# Patient Record
Sex: Female | Born: 1960 | Race: Black or African American | Hispanic: No | Smoking: Current every day smoker
Health system: Southern US, Community
[De-identification: ages and names within clinical notes are randomized; demographics above are authoritative.]

## PROBLEM LIST (undated history)

## (undated) DIAGNOSIS — I10 Essential (primary) hypertension: Secondary | ICD-10-CM

## (undated) DIAGNOSIS — Z8042 Family history of malignant neoplasm of prostate: Secondary | ICD-10-CM

## (undated) DIAGNOSIS — E119 Type 2 diabetes mellitus without complications: Secondary | ICD-10-CM

## (undated) DIAGNOSIS — Z95828 Presence of other vascular implants and grafts: Secondary | ICD-10-CM

## (undated) DIAGNOSIS — C259 Malignant neoplasm of pancreas, unspecified: Secondary | ICD-10-CM

## (undated) DIAGNOSIS — Z800 Family history of malignant neoplasm of digestive organs: Secondary | ICD-10-CM

## (undated) HISTORY — PX: OTHER SURGICAL HISTORY: SHX169

## (undated) HISTORY — PX: KIDNEY SURGERY: SHX687

## (undated) HISTORY — PX: BACK SURGERY: SHX140

## (undated) HISTORY — DX: Family history of malignant neoplasm of prostate: Z80.42

## (undated) HISTORY — PX: ABDOMINAL HYSTERECTOMY: SHX81

## (undated) HISTORY — DX: Malignant neoplasm of pancreas, unspecified: C25.9

## (undated) HISTORY — DX: Essential (primary) hypertension: I10

## (undated) HISTORY — DX: Type 2 diabetes mellitus without complications: E11.9

## (undated) HISTORY — DX: Family history of malignant neoplasm of digestive organs: Z80.0

---
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
## 2019-01-05 ENCOUNTER — Encounter: Payer: Self-pay | Admitting: Hematology

## 2019-05-06 ENCOUNTER — Encounter (HOSPITAL_COMMUNITY): Payer: Self-pay | Admitting: *Deleted

## 2019-05-09 ENCOUNTER — Inpatient Hospital Stay (HOSPITAL_COMMUNITY): Payer: Medicare HMO | Attending: Hematology | Admitting: Hematology

## 2019-05-09 ENCOUNTER — Encounter (HOSPITAL_COMMUNITY): Payer: Self-pay | Admitting: Hematology

## 2019-05-09 ENCOUNTER — Encounter (HOSPITAL_COMMUNITY): Payer: Self-pay | Admitting: Surgery

## 2019-05-09 ENCOUNTER — Other Ambulatory Visit (HOSPITAL_COMMUNITY): Payer: Medicare HMO

## 2019-05-09 ENCOUNTER — Other Ambulatory Visit: Payer: Self-pay

## 2019-05-09 VITALS — BP 114/94 | HR 108 | Temp 97.5°F | Resp 18 | Wt 221.1 lb

## 2019-05-09 DIAGNOSIS — Z79899 Other long term (current) drug therapy: Secondary | ICD-10-CM | POA: Insufficient documentation

## 2019-05-09 DIAGNOSIS — Z5111 Encounter for antineoplastic chemotherapy: Secondary | ICD-10-CM | POA: Insufficient documentation

## 2019-05-09 DIAGNOSIS — Z794 Long term (current) use of insulin: Secondary | ICD-10-CM | POA: Insufficient documentation

## 2019-05-09 DIAGNOSIS — K5900 Constipation, unspecified: Secondary | ICD-10-CM | POA: Diagnosis not present

## 2019-05-09 DIAGNOSIS — I10 Essential (primary) hypertension: Secondary | ICD-10-CM | POA: Insufficient documentation

## 2019-05-09 DIAGNOSIS — C259 Malignant neoplasm of pancreas, unspecified: Secondary | ICD-10-CM

## 2019-05-09 DIAGNOSIS — C787 Secondary malignant neoplasm of liver and intrahepatic bile duct: Secondary | ICD-10-CM

## 2019-05-09 DIAGNOSIS — R5383 Other fatigue: Secondary | ICD-10-CM | POA: Diagnosis not present

## 2019-05-09 DIAGNOSIS — E119 Type 2 diabetes mellitus without complications: Secondary | ICD-10-CM | POA: Insufficient documentation

## 2019-05-09 DIAGNOSIS — Z7189 Other specified counseling: Secondary | ICD-10-CM

## 2019-05-09 DIAGNOSIS — Z90710 Acquired absence of both cervix and uterus: Secondary | ICD-10-CM | POA: Diagnosis not present

## 2019-05-09 DIAGNOSIS — C252 Malignant neoplasm of tail of pancreas: Secondary | ICD-10-CM | POA: Insufficient documentation

## 2019-05-09 DIAGNOSIS — F17210 Nicotine dependence, cigarettes, uncomplicated: Secondary | ICD-10-CM | POA: Diagnosis not present

## 2019-05-09 DIAGNOSIS — R109 Unspecified abdominal pain: Secondary | ICD-10-CM | POA: Diagnosis not present

## 2019-05-09 NOTE — Progress Notes (Signed)
START ON PATHWAY REGIMEN - Pancreatic Adenocarcinoma     A cycle is every 28 days:     Nab-paclitaxel (protein bound)      Gemcitabine   **Always confirm dose/schedule in your pharmacy ordering system**  Patient Characteristics: Metastatic Disease, First Line, PS = 0,1, BRCA1/2 and PALB2  Mutation Absent/Unknown Current evidence of distant metastases<= Yes AJCC T Category: TX AJCC N Category: NX AJCC M Category: M1 AJCC 8 Stage Grouping: IV Line of Therapy: First Line ECOG Performance Status: 1 BRCA1/2 Mutation Status: Awaiting Test Results PALB2 Mutation Status: Awaiting Test Results Intent of Therapy: Non-Curative / Palliative Intent, Discussed with Patient

## 2019-05-09 NOTE — Assessment & Plan Note (Addendum)
1.  Metastatic pancreatic cancer to the liver: - Biopsy of the pancreatic tail mass on 01/05/2019-moderately to poorly differentiated adenocarcinoma - 2 cycles of FOLFIRINOX from 02/25/2019 and 03/28/2019. - CT AP on 04/04/2019 pancreatic mass showing 3.3 x 4.7 x 2.9 cm.  Hypodense lesions in the liver, largest 3.2 cm in the right hepatic lobe and 1.4 cm lesion adjacent to the gallbladder fossa.  There is a 3.3 cm hypodense lesion adjacent to the falciform ligament.  This scan was compared to CT of the abdomen and pelvis without contrast on 10/21/2018. - She was thought to have progression and chemotherapy was changed to gemcitabine and Abraxane.  She received 2 doses on 03/14/2019 and 03/28/2019. - She moved to [place], closer to family. - I do not believe she has a true progression to FOLFIRINOX as she only received 2 cycles and her CT scan was compared to scan from January 2020.  We will request discs of the CT scan. - We will continue gemcitabine and Abraxane regimen at this time.  If she progresses on the current regimen, will consider 5-FU and Onivyde followed by FOLFOX. - I have also talked to her about the importance of checking germline mutation testing.  She is agreeable.  We will make a referral to [facility]. - We will also send foundation 1 testing for somatic mutations. -We will obtain a baseline CA 19-9 level.  We will plan to start her chemotherapy next week.  2.  Abdominal pain: - She is currently taking oxycodone 10 mg every 6 hours as needed which is controlling her pain. - We will give her a refill.  3.  Constipation: -She apparently takes Linzess which controls it well.

## 2019-05-09 NOTE — Progress Notes (Signed)
Oncology Navigator note:  I met with patient during the visit with Dr. Katragadda today.  She was here alone.  Patient just moved here from [place] and now lives with her brother.  She states that she has trouble with transportation.  I have provided her with information on the [facility].  I have also told her that our social worker will be in touch with her about any needs.  I provided patient with my contact information.    I contacted her referring office and got her notes, treatment plan flowsheet of all treatments, pathology report, and labs.  I have ordered foundation one on specimen that is located at [facility] specimen ID # [id number].     

## 2019-05-09 NOTE — Progress Notes (Signed)
[facility] CONSULT NOTE  No care team member to display  CHIEF COMPLAINTS/PURPOSE OF CONSULTATION:  Metastatic pancreatic cancer to the liver.  HISTORY OF PRESENTING ILLNESS:  Doris [name] 58 y.o. female is seen in consultation today at the request of Dr. Mervin Hack for further management of metastatic pancreatic cancer to the liver.  She used to live in [facility] and recently moved to [place].  She was hospitalized for COPD exacerbation in October 2020 and was found to have incidental 28 mm pancreatic tail lesion.  CT of the abdomen on 10/21/2018 showed 2.3 cm mass within the pancreatic tail.  On 01/05/2019 she underwent endoscopic ultrasound which showed 42 x 27 mm mass, biopsy demonstrating poorly differentiated adenocarcinoma.  She was evaluated by pancreatic surgeon on 01/19/2019.  On Jan 28, 2019, on explorative laparotomy she was found to have poorly differentiated adenocarcinoma consistent with pancreatic primary in segment 3 and 5 of the liver.  She was then started on FOLFIRINOX on 02/25/2019.  She reportedly had abdominal pain, likely from Irinotecan with first cycle which improved with atropine.  Other than that she did not have any major side effects.  She did have some fatigue.  She received cycle 2 on March 28, 2019.  She reportedly had a CT scan on 04/04/2019 which showed hypodense pancreatic body mass measuring 3.3 x 4.7 x 2.9 cm.  Hypodense lesions in the liver were seen, largest measuring 3.3 x 2.0 cm, 3.2 cm and 1.4 cm.  Chemotherapy was changed to gemcitabine and Abraxane, she received 2 doses on 03/14/2019 and 03/28/2019.  She did not experience any major side effects from it.  She has moved to [place] and currently lives with her brother.  Her father also lives in [place].  She reported about 40 pound weight loss since May of this year.  She used to work as a Quarry manager at [employer].  She is a current active smoker, smokes 10 cigarettes/day for 35  years.  She has 1 daughter who lives in [place].  Family history significant for mother who died of lung cancer.  No family history of pancreatic cancer.  She reportedly had constipation and was on Linzess.  She reports some tingling in the feet in the mornings.  She also has diabetes.  Appetite and energy levels are 75%.  MEDICAL HISTORY:  Past Medical History:  Diagnosis Date  . Diabetes mellitus without complication ([place])   . Hypertension   . Pancreatic cancer [facility])     SURGICAL HISTORY: Past Surgical History:  Procedure Laterality Date  . ABDOMINAL HYSTERECTOMY    . BACK SURGERY    . KIDNEY SURGERY     per pt, had leakage  . tubal ligation Left     SOCIAL HISTORY: Social History   Socioeconomic History  . Marital status: Divorced    Spouse name: Not on file  . Number of children: 1  . Years of education: Not on file  . Highest education level: Not on file  Occupational History  . Occupation: disabled  Social Needs  . Financial resource strain: Not hard at all  . Food insecurity    Worry: Never true    Inability: Never true  . Transportation needs    Medical: Yes    Non-medical: Yes  Tobacco Use  . Smoking status: Current Every Day Smoker    Packs/day: 1.00    Years: 45.00    Pack years: 45.00    Types: Cigarettes  . Smokeless  tobacco: Never Used  Substance and Sexual Activity  . Alcohol use: Not Currently  . Drug use: Never  . Sexual activity: Not on file  Lifestyle  . Physical activity    Days per week: 0 days    Minutes per session: 0 min  . Stress: Not at all  Relationships  . Social connections    Talks on phone: More than three times a week    Gets together: More than three times a week    Attends religious service: 1 to 4 times per year    Active member of club or organization: No    Attends meetings of clubs or organizations: Never    Relationship status: Divorced  . Intimate partner violence    Fear of current or ex partner: No     Emotionally abused: No    Physically abused: No    Forced sexual activity: No  Other Topics Concern  . Not on file  Social History Narrative  . Not on file    FAMILY HISTORY: Family History  Problem Relation Age of Onset  . Diabetes Mother   . Hypertension Mother   . Diabetes Father   . Hypertension Father   . Heart disease Brother     ALLERGIES:  is allergic to hydrocodone and tramadol.  MEDICATIONS:  Current Outpatient Medications  Medication Sig Dispense Refill  . amLODipine (NORVASC) 5 MG tablet Take 5 mg by mouth 2 (two) times daily.    . insulin aspart (NOVOLOG) 100 UNIT/ML injection Inject 20 Units into the skin 3 (three) times daily before meals.    Marland Kitchen lisinopril (ZESTRIL) 40 MG tablet Take 40 mg by mouth daily.    . ondansetron (ZOFRAN) 4 MG tablet Take 4 mg by mouth 3 (three) times daily.    . Oxycodone HCl 10 MG TABS Take 10 mg by mouth every 6 (six) hours.     No current facility-administered medications for this visit.     REVIEW OF SYSTEMS:   Constitutional: Denies fevers, chills or abnormal night sweats Eyes: Denies blurriness of vision, double vision or watery eyes Ears, nose, mouth, throat, and face: Denies mucositis or sore throat Respiratory: Denies cough, dyspnea or wheezes Cardiovascular: Denies palpitation, chest discomfort or lower extremity swelling Gastrointestinal:  Denies nausea, heartburn or change in bowel habits positive for constipation. Skin: Denies abnormal skin rashes Lymphatics: Denies new lymphadenopathy or easy bruising Neurological: Numbness in the fingertips and feet in the mornings present. Behavioral/Psych: Mood is stable, no new changes  All other systems were reviewed with the patient and are negative.  PHYSICAL EXAMINATION: ECOG PERFORMANCE STATUS: 1 - Symptomatic but completely ambulatory  Vitals:   05/09/19 1257  BP: (!) 114/94  Pulse: (!) 108  Resp: 18  Temp: (!) 97.5 F (36.4 C)  SpO2: 97%   Filed Weights    05/09/19 1257  Weight: 221 lb 1.6 oz (100.3 kg)    GENERAL:alert, no distress and comfortable SKIN: skin color, texture, turgor are normal, no rashes or significant lesions EYES: normal, conjunctiva are pink and non-injected, sclera clear OROPHARYNX:no exudate, no erythema and lips, buccal mucosa, and tongue normal  NECK: supple, thyroid normal size, non-tender, without nodularity LYMPH:  no palpable lymphadenopathy in the cervical, axillary or inguinal LUNGS: clear to auscultation and percussion with normal breathing effort HEART: regular rate & rhythm and no murmurs and no lower extremity edema ABDOMEN:abdomen soft, non-tender and normal bowel sounds Musculoskeletal:no cyanosis of digits and no clubbing  PSYCH: alert &  oriented x 3 with fluent speech NEURO: no focal motor/sensory deficits  LABORATORY DATA:  I have reviewed the data as listed No results found for: WBC, HGB, HCT, MCV, PLT   Chemistry   No results found for: NA, K, CL, CO2, BUN, CREATININE, GLU No results found for: CALCIUM, ALKPHOS, AST, ALT, BILITOT     RADIOGRAPHIC STUDIES: I have personally reviewed the radiological images as listed and agreed with the findings in the report.  ASSESSMENT & PLAN:  Pancreatic cancer metastasized to liver ([place]) 1.  Metastatic pancreatic cancer to the liver: - Biopsy of the pancreatic tail mass on 01/05/2019-moderately to poorly differentiated adenocarcinoma - 2 cycles of FOLFIRINOX from 02/25/2019 and 03/28/2019. - CT AP on 04/04/2019 pancreatic mass showing 3.3 x 4.7 x 2.9 cm.  Hypodense lesions in the liver, largest 3.2 cm in the right hepatic lobe and 1.4 cm lesion adjacent to the gallbladder fossa.  There is a 3.3 cm hypodense lesion adjacent to the falciform ligament.  This scan was compared to CT of the abdomen and pelvis without contrast on 10/21/2018. - She was thought to have progression and chemotherapy was changed to gemcitabine and Abraxane.  She received 2 doses on 03/14/2019  and 03/28/2019. - She moved to [place], closer to family. - I do not believe she has a true progression to FOLFIRINOX as she only received 2 cycles and her CT scan was compared to scan from January 2020.  We will request discs of the CT scan. - We will continue gemcitabine and Abraxane regimen at this time.  If she progresses on the current regimen, will consider 5-FU and Onivyde followed by FOLFOX. - I have also talked to her about the importance of checking germline mutation testing.  She is agreeable.  We will make a referral to [facility]. - We will also send foundation 1 testing for somatic mutations. -We will obtain a baseline CA 19-9 level.  We will plan to start her chemotherapy next week.  2.  Abdominal pain: - She is currently taking oxycodone 10 mg every 6 hours as needed which is controlling her pain. - We will give her a refill.  3.  Constipation: -She apparently takes Linzess which controls it well.  Orders Placed This Encounter  Procedures  . CBC with Differential/Platelet    Standing Status:   Future    Standing Expiration Date:   05/08/2020  . Comprehensive metabolic panel    Standing Status:   Future    Standing Expiration Date:   05/08/2020  . Cancer antigen 19-9    Standing Status:   Future    Standing Expiration Date:   05/08/2020    All questions were answered. The patient knows to call the clinic with any problems, questions or concerns.     Derek Jack, MD 05/09/2019 5:42 PM

## 2019-05-09 NOTE — Patient Instructions (Addendum)
[place] at [facility] Discharge Instructions  You were seen today by Dr. Delton Coombes. He went over your history, family history and how you've been feeling lately. He will schedule you for a genetic consult.  He will see you back in 1 week for labs and follow up.   Thank you for choosing [place] at [facility] to provide your oncology and hematology care.  To afford each patient quality time with our provider, please arrive at least 15 minutes before your scheduled appointment time.   If you have a lab appointment with the [facility] please come in thru the  Main Entrance and check in at the main information desk  You need to re-schedule your appointment should you arrive 10 or more minutes late.  We strive to give you quality time with our providers, and arriving late affects you and other patients whose appointments are after yours.  Also, if you no show three or more times for appointments you may be dismissed from the clinic at the providers discretion.     Again, thank you for choosing [facility].  Our hope is that these requests will decrease the amount of time that you wait before being seen by our physicians.       _____________________________________________________________  Should you have questions after your visit to [facility], please contact our office at [phone] between the hours of 8:00 a.m. and 4:30 p.m.  Voicemails left after 4:00 p.m. will not be returned until the following business day.  For prescription refill requests, have your pharmacy contact our office and allow 72 hours.    Cancer Center Support Programs:   > Cancer Support Group  2nd Tuesday of the month 1pm-2pm, Journey Room

## 2019-05-10 ENCOUNTER — Encounter: Payer: Self-pay | Admitting: General Practice

## 2019-05-10 ENCOUNTER — Telehealth (HOSPITAL_COMMUNITY): Payer: Self-pay | Admitting: Hematology

## 2019-05-10 NOTE — Progress Notes (Signed)
[facility] CSW Progress Notes  Message received from [facility], patient struggles w transportation between home and [facility].  Spoke w patient, gave information on possibility of [employer] (transportation benefit associated w her Medicare), Medicaid transport (alerted her that she may need to transfer her Medicaid to [place] as she is now a resident of [place]) and [employer] (for gas card program).  Patient given number for [facility] and encouraged to call her insurance carriers.  Edwyna Shell, LCSW Clinical Social Worker Phone:  [phone] Cell:  [phone]

## 2019-05-10 NOTE — Telephone Encounter (Signed)
Pts height was not documented on visit. Called pt to get height information for chemo auth purposes. Pt stated she is 5'9.

## 2019-05-11 ENCOUNTER — Telehealth (HOSPITAL_COMMUNITY): Payer: Self-pay | Admitting: Hematology

## 2019-05-11 NOTE — Telephone Encounter (Signed)
pc made to [facility] spk to [place]. Requested she release/withdraw their chemo auth from [facility]. Per  Judeen Hammans she would handle it right away.  Judeen Hammans [phone] _P

## 2019-05-16 ENCOUNTER — Encounter (HOSPITAL_COMMUNITY): Payer: Self-pay

## 2019-05-16 DIAGNOSIS — Z95828 Presence of other vascular implants and grafts: Secondary | ICD-10-CM | POA: Insufficient documentation

## 2019-05-16 HISTORY — DX: Presence of other vascular implants and grafts: Z95.828

## 2019-05-16 MED ORDER — PROCHLORPERAZINE MALEATE 10 MG PO TABS: 10.0000 mg | ORAL_TABLET | Freq: Four times a day (QID) | ORAL | 1 refills | Status: DC | PRN

## 2019-05-16 MED ORDER — LIDOCAINE-PRILOCAINE 2.5-2.5 % EX CREA: TOPICAL_CREAM | CUTANEOUS | 3 refills | Status: DC

## 2019-05-16 NOTE — Patient Instructions (Addendum)
Nab-paclitaxel (protein-bound) (Abraxane)  About This Drug  Nab-paclitaxel is used to treat cancer. It is given in the vein (IV).  Possible Side Effects . Bone marrow suppression. This is a decrease in the number of white blood cells, red blood cells, and platelets. This may raise your risk of infection, make you tired and weak (fatigue), and raise your risk of bleeding. . Abnormal heart beat, blood pressure, and/or abnormal EKG (electrocardiogram) . Nausea and vomiting (throwing up) . Diarrhea (loose bowel movements) . Tiredness, weakness . Swelling of your legs, ankles and/or feet . Fever . Changes in your liver function . Infection . Dehydration (lack of water in the body from losing too much fluid) . Decreased appetite (decreased hunger) . Joint, bone and muscle pain . Rash . Effects on the nerves are called peripheral neuropathy. You may feel numbness, tingling, or pain in your hands and feet. It may be hard for you to button your clothes, open jars, or walk as usual. The effect on the nerves may get worse with more doses of the drug. These effects get better in some people after the drug is stopped but it does not get better in all people. . Hair loss. Hair loss is often temporary, although with certain medicine, hair loss can sometimes be permanent. Hair loss may happen suddenly or gradually. If you lose hair, you may lose it from your head, face, armpits, pubic area, chest, and/or legs. You may also notice your hair getting thin.  Note: Each of the side effects above was reported in 20% or greater of patients treated with nabpaclitaxel. Not all possible side effects are included above.  Warnings and Precautions  . Severe bone marrow suppression . Inflammation (swelling) of the lungs. You may have a dry cough or trouble breathing. . Severe infections which can be life-threatening . Severe peripheral neuropathy . Allergic reactions, including anaphylaxis are rare but may  happen in some patients, which can be life-threatening. Signs of allergic reaction to this drug may be swelling of the face, feeling like your tongue or throat are swelling, trouble breathing, rash, itching, fever, chills, feeling dizzy, and/or feeling that your heart is beating in a fast or not normal way. If this happens, do not take another dose of this drug. You should get urgent medical treatment. . This drug contains albumin, which is a protein from donated human blood. There is a rare risk of transmission of viral diseases.  Note: Some of the side effects above are very rare. If you have concerns and/or questions, please discuss them with your medical team.  Important Information . This drug may be present in the saliva, tears, sweat, urine, stool, vomit, semen, and vaginal secretions. Talk to your doctor and/or your nurse about the necessary precautions to take during this time.  Treating Side Effects  . To decrease the risk of infection, wash your hands regularly. . Avoid close contact with people who have a cold, the flu, or other infections. . Take your temperature as your doctor or nurse tells you, and whenever you feel like you may have a fever. . To help decrease the risk of bleeding, use a soft toothbrush. Check with your nurse before using dental floss. . Be very careful when using knives or tools. . Use an electric shaver instead of a razor. . Manage tiredness by pacing your activities for the day. Be sure to include periods of rest between energy-draining activities. . Drink plenty of fluids (a minimum of eight glasses  per day is recommended). . To help with decreased appetite, eat small, frequent meals. Eat foods high in calories and protein, such as meat, poultry, fish, dry beans, tofu, eggs, nuts, milk, yogurt, cheese, ice cream, pudding, and nutritional supplements. . Consider using sauces and spices to increase taste. Daily exercise, with your doctor's  approval, may increase your appetite. . If you throw up or have loose bowel movements, you should drink more fluids so that you do not become dehydrated (lack of water in the body from losing too much fluid). . To help with nausea and vomiting, eat small, frequent meals instead of three large meals a day. Choose foods and drinks that are at room temperature. Ask your nurse or doctor about other helpful tips and medicine that is available to help stop or lessen these symptoms. . If you have diarrhea, eat low-fiber foods that are high in protein and calories and avoid foods that can irritate your digestive tracts or lead to cramping. . Ask your nurse or doctor about medicine that can lessen or stop your diarrhea. . To help with hair loss, wash hair with a mild shampoo and avoid washing your hair every day. . Avoid rubbing your scalp, pat your hair or scalp dry. . Avoid coloring your hair. . Limit your use of hair spray, electric curlers, blow dryers, and curling irons. . If you are interested in getting a wig, talk to your nurse. You can also call the [facility] at 800-ACS-2345 to find out information about the "Look Good, Feel Better" program close to where you live. It is a free program where women getting chemotherapy can learn about wigs, turbans and scarves as well as makeup techniques and skin and nail care. Marland Kitchen Keeping your pain under control is important to your well-being. Please tell your doctor or nurse if you are experiencing pain. . If you get a rash do not put anything on it unless your doctor or nurse says you may. Keep the area around the rash clean and dry. Ask your doctor for medicine if your rash bothers you. . If you have numbness and tingling in your hands and feet, be careful when cooking, walking, and handling sharp objects and hot liquids.  Food and Drug Interactions  . There are no known interactions of nab-paclitaxel of with food. . This drug may  interact with other medicines. Tell your doctor and pharmacist about all the prescription and over-the-counter medicines and dietary supplements (vitamins, minerals, herbs and others) that you are taking at this time. Also, check with your doctor or pharmacist before starting any new prescription or over-the-counter medicines, or dietary supplements to make sure that there are no interactions.  When to Call the Doctor  Call your doctor or nurse if you have any of these symptoms and/or any new or unusual symptoms: . Fever of 100.4 F (38 C) or higher . Chills . Signs of a local infection such as pain, redness, tenderness, warmth and/or swelling . Easy bleeding or bruising . Wheezing or trouble breathing . Tiredness that interferes with your daily activities . Pain in your chest . Dry cough . Feeling dizzy or lightheaded . Feeling that your heart is beating in a fast or not normal way (palpitations) . Diarrhea, 4 times in one day or diarrhea with weakness or lightheadedness . Nausea that stops you from eating or drinking and/or that isn't relieved by prescribed medicines . Throwing up more than 3 times a day . Lasting loss of  appetite or rapid weight loss of five pounds in a week . Pain that does not go away or is not relieved by prescribed medicine . Numbness, tingling, or pain in your hands and feet . Weight gain of 5 pounds in one week (fluid retention) . Swelling of your legs, ankles and/or feet . Signs of liver problems: dark urine, pale bowel movements, bad stomach pain, feeling very tired and weak, unusual itching, or yellowing of the eyes or skin . Signs of allergic reaction: swelling of the face, feeling like your tongue or throat are swelling, trouble breathing, rash, itching, fever, chills, feeling dizzy, and/or feeling that your heart is beating in a fast or not normal way. If this happens, call 911 for emergency care. . New rash and/or itching . Rash that is not relieved  by prescribed medicines . If you think you are pregnant or have impregnated your partner  Reproduction Warnings  . Pregnancy warning: This drug can have harmful effects on the unborn baby. Women of childbearing potential should use effective methods of birth control during your cancer treatment and for at least 6 months after treatment. Men with female partners of childbearing potential should use effective methods of birth control during your cancer treatment and for at least 3 months after your cancer treatment. Let your doctor know right away if you think you may be pregnant or may have impregnated your partner. . Breastfeeding warning: It is not known if this drug passes into breast milk. For this reason, women should not breastfeed during treatment and for 2 weeks after treatment because this drug could enter the breast milk and cause harm to a breastfeeding baby. . Fertility Warning: In men and women both, this drug may affect your ability to have children in the future. Talk with your doctor or nurse if you plan to have children. Ask for information on sperm or egg banking.  Gemcitabine (Gemzar)  About This Drug  Gemcitabine is used to treat cancer. It is given in the vein (IV).  Possible Side Effects . Bone marrow suppression. This is a decrease in the number of white blood cells, red blood cells, and platelets. This may raise your risk of infection, make you tired and weak (fatigue), and raise your risk of bleeding . Fever . Trouble breathing . Nausea and throwing up (vomiting) . Changes in your liver function . Increased protein in your urine, which can affect how your kidneys work . Blood in your urine . Rash . Swelling of your legs, ankles and/or feet  Note: Each of the side effects above was reported in 20% or greater of patients treated with Gemcitabine. Not all possible side effects are included above.  Warnings and Precautions  . Severe bone marrow  suppression . Inflammation (swelling) of the lungs and/or thickening of the lung tissues, which may be lifethreatening. You may have a dry cough or trouble breathing. . Changes in your kidney function, which can cause kidney failure . Changes in your liver function, which can cause liver failure and may be life-threatening . If you have received radiation treatments, your skin may become red and/or you may develop soreness of the mouth and throat after gemcitabine. This reaction is called "recall." Your body is recalling, or remembering, that it had radiation therapy. . A syndrome where fluid from your veins can leak into your tissues and cause a decrease in your blood pressure and fluid to accumulate in your tissues and/or lungs. . A syndrome can occur that  causes changes to kidney and liver function in combination with a decrease in red blood cells. Kidney failure may result which may be life-threatening. . Changes in your central nervous system can happen. The central nervous system is made up of your brain and spinal cord. You could feel extreme tiredness, agitation, confusion, hallucinations (see or hear things that are not there), have trouble understanding or speaking, loss of control of your bowels or bladder, eyesight changes, numbness or lack of strength to your arms, legs, face, or body, seizures or coma. If you start to have any of these symptoms let your doctor know right away.  Note: Some of the side effects above are very rare. If you have concerns and/or questions, please discuss them with your medical team.  Important Information  . This drug may be present in the saliva, tears, sweat, urine, stool, vomit, semen, and vaginal secretions. Talk to your doctor and/or your nurse about the necessary precautions to take during this time.  Treating Side Effects  . Manage tiredness by pacing your activities for the day. . Be sure to include periods of rest between  energy-draining activities. . To decrease the risk of infection, wash your hands regularly. . Avoid close contact with people who have a cold, the flu, or other infections. . Take your temperature as your doctor or nurse tells you, and whenever you feel like you may have a fever. . To help decrease bleeding, use a soft toothbrush. Check with your nurse before using dental floss. . Be very careful when using knives or tools. . Use an electric shaver instead of a razor. . Drink plenty of fluids (a minimum of eight glasses per day is recommended). . If you throw up or have loose bowel movements, you should drink more fluids so that you do not become dehydrated (lack of water in the body from losing too much fluid). . To help with nausea and vomiting, eat small, frequent meals instead of three large meals a day. Choose foods and drinks that are at room temperature. Ask your nurse or doctor about other helpful tips and medicine that is available to help stop or lessen these symptoms. . If you get a rash do not put anything on it unless your doctor or nurse says you may. Keep the area around the rash clean and dry. Ask your doctor for medicine if your rash bothers you. . If you received radiation, and your skin becomes red or irritated again, or you develop soreness of the mouth and throat, follow the same care instructions you did during radiation treatment. Be sure to tell the nurse or doctor administering your chemotherapy about your skin changes.  Food and Drug Interactions  . There are no known interactions of gemcitabine with food. . This drug may interact with other medicines. Tell your doctor and pharmacist about all the prescription and over-the-counter medicines and dietary supplements (vitamins, minerals, herbs and others) that you are taking at this time. Also, check with your doctor or pharmacist before starting any new prescription or over-the-counter medicines, or dietary  supplements to make sure that there are no interactions.  When to Call the Doctor Call your doctor or nurse if you have any of these symptoms and/or any new or unusual symptoms: . Fever of 100.4 F (38 C) or higher . Chills . Tiredness that interferes with your daily activities . Feeling dizzy or lightheaded . Pain in your chest . Dry cough . Wheezing and/or trouble breathing . Confusion  and/or agitation . Symptoms of a seizure such as confusion, blacking out, passing out, loss of hearing or vision, blurred vision, unusual smells or tastes (such as burning rubber), trouble talking, tremors or shaking in parts or all of the body, repeated body movements, tense muscles that do not relax, and loss of control of urine and bowels. If you or your family member suspects you are having a seizure, call 911 right away. . Hallucinations . Trouble understanding or speaking . Blurry vision or changes in your eyesight . Numbness or lack of strength to your arms, legs, face, or body . Easy bleeding or bruising . Nausea that stops you from eating or drinking and/or is not relieved by prescribed medicines . Throwing up more than 3 times a day . Swelling of legs, ankles, or feet . Weight gain of 5 pounds in one week (fluid retention) . Blood in urine . Decreased urine or very dark urine . Foamy or bubbly-looking urine . A new rash/itching or a rash that is not relieved by prescribed medicines . Signs of possible liver problems: dark urine, pale bowel movements, bad stomach pain, feeling very tired and weak, unusual itching, or yellowing of the eyes or skin . If you think you may be pregnant or may have impregnated your partner  Reproduction Warnings  . Pregnancy warning: This drug can have harmful effects on the unborn baby. Women of childbearing potential should use effective methods of birth control during your cancer treatment and for 6 months after treatment. Men with female partners of  childbearing potential should use effective methods of birth control during your cancer treatment and for 3 months after your cancer treatment. Let your doctor know right away if you think you may be pregnant or may have impregnated your partner.  . Breastfeeding warning: Women should not breastfeed during treatment and for 1 week after treatment because this drug could enter the breast milk and cause harm to a breastfeeding baby.  . Fertility warning: In men, this drug may affect your ability to have children in the future. Talk with your doctor or nurse if you plan to have children. Ask for information on sperm banking.Doris [name] Discharge Instructions for Patients Receiving Chemotherapy  Today you received the following chemotherapy agents   To help prevent nausea and vomiting after your treatment, we encourage you to take your nausea medication    If you develop nausea and vomiting that is not controlled by your nausea medication, call the clinic.   BELOW ARE SYMPTOMS THAT SHOULD BE REPORTED IMMEDIATELY:  *FEVER GREATER THAN 100.5 F  *CHILLS WITH OR WITHOUT FEVER  NAUSEA AND VOMITING THAT IS NOT CONTROLLED WITH YOUR NAUSEA MEDICATION  *UNUSUAL SHORTNESS OF BREATH  *UNUSUAL BRUISING OR BLEEDING  TENDERNESS IN MOUTH AND THROAT WITH OR WITHOUT PRESENCE OF ULCERS  *URINARY PROBLEMS  *BOWEL PROBLEMS  UNUSUAL RASH Items with * indicate a potential emergency and should be followed up as soon as possible.  Feel free to call the clinic should you have any questions or concerns. The clinic phone number is [phone].  Please show the [facility] at check-in to the Emergency Department and triage nurse.

## 2019-05-17 ENCOUNTER — Encounter: Payer: Self-pay | Admitting: General Practice

## 2019-05-17 ENCOUNTER — Encounter (HOSPITAL_COMMUNITY): Payer: Self-pay | Admitting: Hematology

## 2019-05-17 ENCOUNTER — Inpatient Hospital Stay (HOSPITAL_COMMUNITY): Payer: Medicare HMO | Admitting: General Practice

## 2019-05-17 ENCOUNTER — Other Ambulatory Visit: Payer: Self-pay

## 2019-05-17 ENCOUNTER — Inpatient Hospital Stay (HOSPITAL_COMMUNITY): Payer: Medicare HMO

## 2019-05-17 ENCOUNTER — Inpatient Hospital Stay (HOSPITAL_BASED_OUTPATIENT_CLINIC_OR_DEPARTMENT_OTHER): Payer: Medicare HMO | Admitting: Hematology

## 2019-05-17 ENCOUNTER — Encounter (HOSPITAL_COMMUNITY): Payer: Self-pay | Admitting: *Deleted

## 2019-05-17 VITALS — BP 142/66 | HR 77 | Temp 97.8°F | Resp 18

## 2019-05-17 DIAGNOSIS — C259 Malignant neoplasm of pancreas, unspecified: Secondary | ICD-10-CM | POA: Diagnosis not present

## 2019-05-17 DIAGNOSIS — C787 Secondary malignant neoplasm of liver and intrahepatic bile duct: Secondary | ICD-10-CM

## 2019-05-17 DIAGNOSIS — Z95828 Presence of other vascular implants and grafts: Secondary | ICD-10-CM

## 2019-05-17 DIAGNOSIS — Z5111 Encounter for antineoplastic chemotherapy: Secondary | ICD-10-CM | POA: Diagnosis not present

## 2019-05-17 LAB — COMPREHENSIVE METABOLIC PANEL
ALT: 17 U/L (ref 0–44)
AST: 22 U/L (ref 15–41)
Albumin: 3.7 g/dL (ref 3.5–5.0)
Alkaline Phosphatase: 86 U/L (ref 38–126)
Anion gap: 10 (ref 5–15)
BUN: 7 mg/dL (ref 6–20)
CO2: 24 mmol/L (ref 22–32)
Calcium: 8.7 mg/dL — ABNORMAL LOW (ref 8.9–10.3)
Chloride: 99 mmol/L (ref 98–111)
Creatinine, Ser: 0.62 mg/dL (ref 0.44–1.00)
GFR calc Af Amer: >60 mL/min (ref >60)
GFR calc non Af Amer: >60 mL/min (ref >60)
Glucose, Bld: 267 mg/dL — ABNORMAL HIGH (ref 70–99)
Potassium: 3.6 mmol/L (ref 3.5–5.1)
Sodium: 133 mmol/L — ABNORMAL LOW (ref 135–145)
Total Bilirubin: 0.6 mg/dL (ref 0.3–1.2)
Total Protein: 7.2 g/dL (ref 6.5–8.1)

## 2019-05-17 LAB — CBC WITH DIFFERENTIAL/PLATELET
Abs Immature Granulocytes: 0.08 10*3/uL — ABNORMAL HIGH (ref 0.00–0.07)
Basophils Absolute: 0 10*3/uL (ref 0.0–0.1)
Basophils Relative: 1 %
Eosinophils Absolute: 0.1 10*3/uL (ref 0.0–0.5)
Eosinophils Relative: 1 %
HCT: 39.1 % (ref 36.0–46.0)
Hemoglobin: 11.9 g/dL — ABNORMAL LOW (ref 12.0–15.0)
Immature Granulocytes: 1 %
Lymphocytes Relative: 28 %
Lymphs Abs: 1.6 10*3/uL (ref 0.7–4.0)
MCH: 24.2 pg — ABNORMAL LOW (ref 26.0–34.0)
MCHC: 30.4 g/dL (ref 30.0–36.0)
MCV: 79.6 fL — ABNORMAL LOW (ref 80.0–100.0)
Monocytes Absolute: 1.9 10*3/uL — ABNORMAL HIGH (ref 0.1–1.0)
Monocytes Relative: 32 %
Neutro Abs: 2.1 10*3/uL (ref 1.7–7.7)
Neutrophils Relative %: 37 %
Platelets: 557 10*3/uL — ABNORMAL HIGH (ref 150–400)
RBC: 4.91 MIL/uL (ref 3.87–5.11)
RDW: 22.6 % — ABNORMAL HIGH (ref 11.5–15.5)
WBC: 5.7 10*3/uL (ref 4.0–10.5)
nRBC: 0 % (ref 0.0–0.2)

## 2019-05-17 MED ORDER — SODIUM CHLORIDE 0.9% FLUSH
10.0000 mL | INTRAVENOUS | Status: DC | PRN
  Administered 2019-05-17: 13:00:00 10 mL
  Filled 2019-05-17: qty 10

## 2019-05-17 MED ORDER — SODIUM CHLORIDE 0.9 % IV SOLN
8.0000 mg | Freq: Once | INTRAVENOUS | Status: DC
  Filled 2019-05-17: qty 4

## 2019-05-17 MED ORDER — SODIUM CHLORIDE 0.9 % IV SOLN
2200.0000 mg | Freq: Once | INTRAVENOUS | Status: AC
  Administered 2019-05-17: 2200 mg via INTRAVENOUS
  Filled 2019-05-17: qty 52.6

## 2019-05-17 MED ORDER — SODIUM CHLORIDE 0.9 % IV SOLN
Freq: Once | INTRAVENOUS | Status: AC
  Administered 2019-05-17: 11:00:00 via INTRAVENOUS
  Filled 2019-05-17: qty 4

## 2019-05-17 MED ORDER — PACLITAXEL PROTEIN-BOUND CHEMO INJECTION 100 MG
125.0000 mg/m2 | Freq: Once | INTRAVENOUS | Status: AC
  Administered 2019-05-17: 275 mg via INTRAVENOUS
  Filled 2019-05-17: qty 55

## 2019-05-17 MED ORDER — SODIUM CHLORIDE 0.9 % IV SOLN
10.0000 mg | Freq: Once | INTRAVENOUS | Status: DC
  Filled 2019-05-17: qty 1

## 2019-05-17 MED ORDER — HEPARIN SOD (PORK) LOCK FLUSH 100 UNIT/ML IV SOLN
500.0000 [IU] | Freq: Once | INTRAVENOUS | Status: AC | PRN
  Administered 2019-05-17: 500 [IU]

## 2019-05-17 MED ORDER — SODIUM CHLORIDE 0.9 % IV SOLN
Freq: Once | INTRAVENOUS | Status: AC
  Administered 2019-05-17: 10:00:00 via INTRAVENOUS

## 2019-05-17 NOTE — Progress Notes (Signed)
Doris [name], Doris [name]   CLINIC:  Medical Oncology/Hematology  PCP:  No primary care provider on file. No primary provider on file. None   REASON FOR VISIT:  Follow-up for metastatic pancreatic cancer to the liver.   BRIEF ONCOLOGIC HISTORY:  Oncology History  Pancreatic cancer metastasized to liver ([place])  05/09/2019 Initial Diagnosis   Pancreatic cancer metastasized to liver ([place])   05/17/2019 -  Chemotherapy   The patient had PACLitaxel-protein bound (ABRAXANE) chemo infusion 275 mg, 125 mg/m2 = 275 mg, Intravenous,  Once, 1 of 4 cycles Administration: 275 mg (05/17/2019) ondansetron (ZOFRAN) 8 mg in sodium chloride 0.9 % 50 mL IVPB, 8 mg (100 % of original dose 8 mg), Intravenous,  Once, 1 of 4 cycles Dose modification: 8 mg (original dose 8 mg, Cycle 1) gemcitabine (GEMZAR) 2,200 mg in sodium chloride 0.9 % 250 mL chemo infusion, 2,204 mg, Intravenous,  Once, 1 of 4 cycles Administration: 2,200 mg (05/17/2019) ondansetron (ZOFRAN) 8 mg, dexamethasone (DECADRON) 10 mg in sodium chloride 0.9 % 50 mL IVPB, , Intravenous,  Once, 1 of 4 cycles Administration:  (05/17/2019)  for chemotherapy treatment.       CANCER STAGING: Cancer Staging No matching staging information was found for the patient.   INTERVAL HISTORY:  Doris [name] 58 y.o. female seen for follow-up and starting of chemotherapy for metastatic pancreatic cancer to the liver.  She reported that abdominal pain is fairly well controlled with current pain regimen containing oxycodone.  She takes as frequently as every 6 hours if needed.  She has constipation which is fairly controlled with  Linzess.  Denies any fevers or infections.  Denies any nausea vomiting or diarrhea.  Denies any tingling or numbness in the extremities.  No ER visits or hospitalizations.  Appetite is 75%.  Energy levels are 50%.    REVIEW OF SYSTEMS:  Review of Systems  Gastrointestinal: Positive for  abdominal pain and constipation.  All other systems reviewed and are negative.    PAST MEDICAL/SURGICAL HISTORY:  Past Medical History:  Diagnosis Date  . Diabetes mellitus without complication ([place])   . Hypertension   . Pancreatic cancer ([place])   . Port-A-Cath in place 05/16/2019   Past Surgical History:  Procedure Laterality Date  . ABDOMINAL HYSTERECTOMY    . BACK SURGERY    . KIDNEY SURGERY     per pt, had leakage  . tubal ligation Left      SOCIAL HISTORY:  Social History   Socioeconomic History  . Marital status: Divorced    Spouse name: Not on file  . Number of children: 1  . Years of education: Not on file  . Highest education level: Not on file  Occupational History  . Occupation: disabled  Social Needs  . Financial resource strain: Not hard at all  . Food insecurity    Worry: Never true    Inability: Never true  . Transportation needs    Medical: Yes    Non-medical: Yes  Tobacco Use  . Smoking status: Current Every Day Smoker    Packs/day: 1.00    Years: 45.00    Pack years: 45.00    Types: Cigarettes  . Smokeless tobacco: Never Used  Substance and Sexual Activity  . Alcohol use: Not Currently  . Drug use: Never  . Sexual activity: Not on file  Lifestyle  . Physical activity    Days per week: 0 days    Minutes  per session: 0 min  . Stress: Not at all  Relationships  . Social connections    Talks on phone: More than three times a week    Gets together: More than three times a week    Attends religious service: 1 to 4 times per year    Active member of club or organization: No    Attends meetings of clubs or organizations: Never    Relationship status: Divorced  . Intimate partner violence    Fear of current or ex partner: No    Emotionally abused: No    Physically abused: No    Forced sexual activity: No  Other Topics Concern  . Not on file  Social History Narrative  . Not on file    FAMILY HISTORY:  Family History  Problem  Relation Age of Onset  . Diabetes Mother   . Hypertension Mother   . Diabetes Father   . Hypertension Father   . Heart disease Brother     CURRENT MEDICATIONS:  Outpatient Encounter Medications as of 05/17/2019  Medication Sig  . amLODipine (NORVASC) 5 MG tablet Take 5 mg by mouth 2 (two) times daily.  . Gemcitabine HCl (GEMZAR IV) Inject into the vein. Days 1, 8, 15 q 28 days  . insulin aspart (NOVOLOG) 100 UNIT/ML injection Inject 20 Units into the skin 3 (three) times daily before meals.  . lidocaine-prilocaine (EMLA) cream Apply small amount to port a cath site and cover with plastic wrap 1 hour prior to chemotherapy appointments  . lisinopril (ZESTRIL) 40 MG tablet Take 40 mg by mouth daily.  . ondansetron (ZOFRAN) 4 MG tablet Take 4 mg by mouth 3 (three) times daily.  . Oxycodone HCl 10 MG TABS Take 10 mg by mouth every 6 (six) hours.  Marland Kitchen PACLitaxel Protein-Bound Part (ABRAXANE IV) Inject into the vein. Days 1, 8, 15 q 28 days  . prochlorperazine (COMPAZINE) 10 MG tablet Take 1 tablet (10 mg total) by mouth every 6 (six) hours as needed (Nausea or vomiting).   No facility-administered encounter medications on file as of 05/17/2019.     ALLERGIES:  Allergies  Allergen Reactions  . Hydrocodone Hives  . Tramadol Itching     PHYSICAL EXAM:  ECOG Performance status: 1  Vitals:   05/17/19 0855  BP: (!) 147/75  Pulse: 91  Resp: 18  Temp: 97.9 F (36.6 C)  SpO2: 98%   Filed Weights   05/17/19 0855  Weight: 220 lb 1.6 oz (99.8 kg)    Physical Exam Vitals signs reviewed.  Constitutional:      Appearance: Normal appearance.  Cardiovascular:     Rate and Rhythm: Normal rate and regular rhythm.     Heart sounds: Normal heart sounds.  Pulmonary:     Effort: Pulmonary effort is normal.     Breath sounds: Normal breath sounds.  Abdominal:     General: There is no distension.     Palpations: Abdomen is soft. There is no mass.  Musculoskeletal:        General: No  swelling.  Lymphadenopathy:     Cervical: No cervical adenopathy.  Skin:    General: Skin is warm.  Neurological:     General: No focal deficit present.     Mental Status: She is alert and oriented to person, place, and time.  Psychiatric:        Mood and Affect: Mood normal.        Behavior: Behavior normal.  LABORATORY DATA:  I have reviewed the labs as listed.  CBC    Component Value Date/Time   WBC 5.7 05/17/2019 0857   RBC 4.91 05/17/2019 0857   HGB 11.9 (L) 05/17/2019 0857   HCT 39.1 05/17/2019 0857   PLT 557 (H) 05/17/2019 0857   MCV 79.6 (L) 05/17/2019 0857   MCH 24.2 (L) 05/17/2019 0857   MCHC 30.4 05/17/2019 0857   RDW 22.6 (H) 05/17/2019 0857   LYMPHSABS 1.6 05/17/2019 0857   MONOABS 1.9 (H) 05/17/2019 0857   EOSABS 0.1 05/17/2019 0857   BASOSABS 0.0 05/17/2019 0857   CMP Latest Ref Rng & Units 05/17/2019  Glucose 70 - 99 mg/dL 267(H)  BUN 6 - 20 mg/dL 7  Creatinine 0.44 - 1.00 mg/dL 0.62  Sodium 135 - 145 mmol/L 133(L)  Potassium 3.5 - 5.1 mmol/L 3.6  Chloride 98 - 111 mmol/L 99  CO2 22 - 32 mmol/L 24  Calcium 8.9 - 10.3 mg/dL 8.7(L)  Total Protein 6.5 - 8.1 g/dL 7.2  Total Bilirubin 0.3 - 1.2 mg/dL 0.6  Alkaline Phos 38 - 126 U/L 86  AST 15 - 41 U/L 22  ALT 0 - 44 U/L 17       DIAGNOSTIC IMAGING:  I have independently reviewed the scans and discussed with the patient.   I have reviewed Venita Lick LPN's note and agree with the documentation.  I personally performed a face-to-face visit, made revisions and my assessment and plan is as follows.    ASSESSMENT & PLAN:   Pancreatic cancer metastasized to liver ([place]) 1.  Metastatic pancreatic cancer to the liver: -Biopsy of the pancreatic tail mass on 01/05/2019, moderately to poorly differentiated adenocarcinoma. -3 cycles of FOLFIRINOX from 02/25/2019 through 03/28/2019. - CTAP on 04/04/2019 shows pancreatic mass showing 3.3 x 4.7 x 2.9 cm.  Hypodense lesions in the liver, largest 3.2 cm in  the right hepatic lobe and 1.4 cm lesion adjacent to the gallbladder fossa.  There is a 3.3 cm hypodense lesion adjacent to the falciform ligament.  This scan was compared to CT scan of the AP without contrast on 10/21/2018. -She was thought to have progression in chemotherapy was switched to gemcitabine and Abraxane.  She received 2 doses on 03/14/2019 and 03/28/2019 in [place]. - I do not believe she is a true progression on FOLFIRINOX.  We have requested discs of CT scan. - I have strongly recommended germline mutation testing.  We will make a referral. - I have also requested foundation 1 testing for somatic mutations. - We will start her on her cycle of gemcitabine and Abraxane today, as she could not complete her previous cycle. -We talked about the schedule and side effects in detail.  I have reviewed her labs. -She will proceed with her treatment today.  I will see her back in 1 week for follow-up.  2.  Abdominal pain: -She is currently taking oxycodone 10 mg every 6 hours as needed which is controlling her pain.  3.  Constipation: -She takes Linzess which controls it very well.  Total time spent is 40 minutes with more than 50% of the time spent face-to-face discussing treatment plan, side effects, counseling and coordination of care.    Orders placed this encounter:  No orders of the defined types were placed in this encounter.     Derek Jack, MD [place] [phone]

## 2019-05-17 NOTE — Progress Notes (Signed)
Pt presents today for f/u office visit and treatment today. VS within parameters for treatment. Labs within parameters for treatment. Message received to proceed. Consent signed at the bedside. Understanding verbalized.   Treatment given today per MD orders. Tolerated infusion without adverse affects. Vital signs stable. No complaints at this time. Discharged from clinic ambulatory. F/U with [facility] as scheduled.

## 2019-05-17 NOTE — Progress Notes (Signed)
I received notification from [facility] that they are still trying to get the specimen for testing from [facility].  They will update [place] on the status of the test once they receive the specimen.

## 2019-05-17 NOTE — Progress Notes (Unsigned)
[place] CSW Progress Notes  [facility]) "does not do gas cards now."  States she cannot get help from [facility] because "I would have to switch states."  .  Left VM for [facility] to determine if there is anything else that can be done for her.  Appears that her M[employer]are is traditional [employer] which does not have a transportation benefit.  CSW will email patient information on small grant options from [place],   Edwyna Shell, [place] Worker Phone:  [phone] Cell:  [phone]

## 2019-05-17 NOTE — Progress Notes (Signed)
[place] CSW Progress Notes  Spoke w [place] - they [place] residents - pt would need to become a legal [place] resident and then could apply for mileage reimbursement (20 cents/mile) upon submission of AVS and other documents related to appointments.  Agency states that they encourage people to apply/use [place] transport resources as their reimbursement rates are higher.  Emailed information on two outside options - Cancer [facility] and [place] - to [employer], Therapist, sports. Spoke w patient and asked her to speak w RN as patient does not have email address.  Pt can apply for these resources which have one time grant funds.   At this point, patient states she is "working w family" to get resources to be able to come to appts at [facility].  Edwyna Shell, LCSW Clinical Social Worker Phone:  [phone] .

## 2019-05-17 NOTE — Patient Instructions (Signed)
[facility] at [facility] Discharge Instructions  You were seen today by Dr. Katragadda. He went over your recent lab results. He will see you back in 1 week for labs and follow up.   Thank you for choosing [facility] at [facility] to provide your oncology and hematology care.  To afford each patient quality time with our provider, please arrive at least 15 minutes before your scheduled appointment time.   If you have a lab appointment with the Cancer Center please come in thru the  Main Entrance and check in at the main information desk  You need to re-schedule your appointment should you arrive 10 or more minutes late.  We strive to give you quality time with our providers, and arriving late affects you and other patients whose appointments are after yours.  Also, if you no show three or more times for appointments you may be dismissed from the clinic at the providers discretion.     Again, thank you for choosing [facility].  Our hope is that these requests will decrease the amount of time that you wait before being seen by our physicians.       _____________________________________________________________  Should you have questions after your visit to [facility], please contact our office at [phone] between the hours of 8:00 a.m. and 4:30 p.m.  Voicemails left after 4:00 p.m. will not be returned until the following business day.  For prescription refill requests, have your pharmacy contact our office and allow 72 hours.    Cancer Center Support Programs:   > Cancer Support Group  2nd Tuesday of the month 1pm-2pm, Journey Room    

## 2019-05-17 NOTE — Assessment & Plan Note (Signed)
1.  Metastatic pancreatic cancer to the liver: -Biopsy of the pancreatic tail mass on 01/05/2019, moderately to poorly differentiated adenocarcinoma. -3 cycles of FOLFIRINOX from 02/25/2019 through 03/28/2019. - CTAP on 04/04/2019 shows pancreatic mass showing 3.3 x 4.7 x 2.9 cm.  Hypodense lesions in the liver, largest 3.2 cm in the right hepatic lobe and 1.4 cm lesion adjacent to the gallbladder fossa.  There is a 3.3 cm hypodense lesion adjacent to the falciform ligament.  This scan was compared to CT scan of the AP without contrast on 10/21/2018. -She was thought to have progression in chemotherapy was switched to gemcitabine and Abraxane.  She received 2 doses on 03/14/2019 and 03/28/2019 in [place]. - I do not believe she is a true progression on FOLFIRINOX.  We have requested discs of CT scan. - I have strongly recommended germline mutation testing.  We will make a referral. - I have also requested foundation 1 testing for somatic mutations. - We will start her on her cycle of gemcitabine and Abraxane today, as she could not complete her previous cycle. -We talked about the schedule and side effects in detail.  I have reviewed her labs. -She will proceed with her treatment today.  I will see her back in 1 week for follow-up.  2.  Abdominal pain: -She is currently taking oxycodone 10 mg every 6 hours as needed which is controlling her pain.  3.  Constipation: -She takes Linzess which controls it very well.

## 2019-05-18 LAB — CANCER ANTIGEN 19-9: CA 19-9: 1 U/mL (ref 0–35)

## 2019-05-24 ENCOUNTER — Encounter (HOSPITAL_COMMUNITY): Payer: Self-pay | Admitting: Hematology

## 2019-05-24 ENCOUNTER — Other Ambulatory Visit: Payer: Self-pay

## 2019-05-24 ENCOUNTER — Inpatient Hospital Stay (HOSPITAL_BASED_OUTPATIENT_CLINIC_OR_DEPARTMENT_OTHER): Payer: Medicare HMO | Admitting: Hematology

## 2019-05-24 ENCOUNTER — Inpatient Hospital Stay (HOSPITAL_COMMUNITY): Payer: Medicare HMO

## 2019-05-24 VITALS — BP 130/82 | HR 81 | Temp 98.4°F | Resp 18

## 2019-05-24 DIAGNOSIS — Z5111 Encounter for antineoplastic chemotherapy: Secondary | ICD-10-CM | POA: Diagnosis not present

## 2019-05-24 DIAGNOSIS — C787 Secondary malignant neoplasm of liver and intrahepatic bile duct: Secondary | ICD-10-CM | POA: Diagnosis not present

## 2019-05-24 DIAGNOSIS — C259 Malignant neoplasm of pancreas, unspecified: Secondary | ICD-10-CM | POA: Diagnosis not present

## 2019-05-24 DIAGNOSIS — Z95828 Presence of other vascular implants and grafts: Secondary | ICD-10-CM

## 2019-05-24 LAB — CBC WITH DIFFERENTIAL/PLATELET
Abs Immature Granulocytes: 0.03 10*3/uL (ref 0.00–0.07)
Basophils Absolute: 0.1 10*3/uL (ref 0.0–0.1)
Basophils Relative: 2 %
Eosinophils Absolute: 0 10*3/uL (ref 0.0–0.5)
Eosinophils Relative: 1 %
HCT: 39.1 % (ref 36.0–46.0)
Hemoglobin: 11.8 g/dL — ABNORMAL LOW (ref 12.0–15.0)
Immature Granulocytes: 1 %
Lymphocytes Relative: 64 %
Lymphs Abs: 2.2 10*3/uL (ref 0.7–4.0)
MCH: 24.2 pg — ABNORMAL LOW (ref 26.0–34.0)
MCHC: 30.2 g/dL (ref 30.0–36.0)
MCV: 80.3 fL (ref 80.0–100.0)
Monocytes Absolute: 0.3 10*3/uL (ref 0.1–1.0)
Monocytes Relative: 9 %
Neutro Abs: 0.8 10*3/uL — ABNORMAL LOW (ref 1.7–7.7)
Neutrophils Relative %: 23 %
Platelets: 291 10*3/uL (ref 150–400)
RBC: 4.87 MIL/uL (ref 3.87–5.11)
RDW: 21.7 % — ABNORMAL HIGH (ref 11.5–15.5)
WBC: 3.4 10*3/uL — ABNORMAL LOW (ref 4.0–10.5)
nRBC: 0 % (ref 0.0–0.2)

## 2019-05-24 LAB — COMPREHENSIVE METABOLIC PANEL
ALT: 23 U/L (ref 0–44)
AST: 24 U/L (ref 15–41)
Albumin: 3.7 g/dL (ref 3.5–5.0)
Alkaline Phosphatase: 76 U/L (ref 38–126)
Anion gap: 8 (ref 5–15)
BUN: 10 mg/dL (ref 6–20)
CO2: 26 mmol/L (ref 22–32)
Calcium: 9.1 mg/dL (ref 8.9–10.3)
Chloride: 101 mmol/L (ref 98–111)
Creatinine, Ser: 0.57 mg/dL (ref 0.44–1.00)
GFR calc Af Amer: >60 mL/min (ref >60)
GFR calc non Af Amer: >60 mL/min (ref >60)
Glucose, Bld: 244 mg/dL — ABNORMAL HIGH (ref 70–99)
Potassium: 4 mmol/L (ref 3.5–5.1)
Sodium: 135 mmol/L (ref 135–145)
Total Bilirubin: 0.5 mg/dL (ref 0.3–1.2)
Total Protein: 7.2 g/dL (ref 6.5–8.1)

## 2019-05-24 MED ORDER — SODIUM CHLORIDE 0.9 % IV SOLN
Freq: Once | INTRAVENOUS | Status: AC
  Administered 2019-05-24: 09:00:00 via INTRAVENOUS

## 2019-05-24 MED ORDER — PACLITAXEL PROTEIN-BOUND CHEMO INJECTION 100 MG
100.0000 mg/m2 | Freq: Once | INTRAVENOUS | Status: AC
  Administered 2019-05-24: 225 mg via INTRAVENOUS
  Filled 2019-05-24: qty 45

## 2019-05-24 MED ORDER — HEPARIN SOD (PORK) LOCK FLUSH 100 UNIT/ML IV SOLN
500.0000 [IU] | Freq: Once | INTRAVENOUS | Status: AC | PRN
  Administered 2019-05-24: 13:00:00 500 [IU]

## 2019-05-24 MED ORDER — SODIUM CHLORIDE 0.9% FLUSH
10.0000 mL | INTRAVENOUS | Status: DC | PRN
  Administered 2019-05-24: 10 mL
  Filled 2019-05-24: qty 10

## 2019-05-24 MED ORDER — LINACLOTIDE 72 MCG PO CAPS: 72.0000 ug | ORAL_CAPSULE | Freq: Every day | ORAL | 2 refills | Status: DC

## 2019-05-24 MED ORDER — SODIUM CHLORIDE 0.9 % IV SOLN
Freq: Once | INTRAVENOUS | Status: AC
  Administered 2019-05-24: 10:00:00 via INTRAVENOUS
  Filled 2019-05-24: qty 4

## 2019-05-24 MED ORDER — SODIUM CHLORIDE 0.9 % IV SOLN
1600.0000 mg | Freq: Once | INTRAVENOUS | Status: AC
  Administered 2019-05-24: 12:00:00 1600 mg via INTRAVENOUS
  Filled 2019-05-24: qty 42.08

## 2019-05-24 NOTE — Patient Instructions (Addendum)
[facility] at [facility] Discharge Instructions  You were seen today by Dr. Katragadda. He went over your recent lab results. He will see you back in 1 week for labs and follow up.   Thank you for choosing [facility] at [facility] to provide your oncology and hematology care.  To afford each patient quality time with our provider, please arrive at least 15 minutes before your scheduled appointment time.   If you have a lab appointment with the Cancer Center please come in thru the  Main Entrance and check in at the main information desk  You need to re-schedule your appointment should you arrive 10 or more minutes late.  We strive to give you quality time with our providers, and arriving late affects you and other patients whose appointments are after yours.  Also, if you no show three or more times for appointments you may be dismissed from the clinic at the providers discretion.     Again, thank you for choosing [facility].  Our hope is that these requests will decrease the amount of time that you wait before being seen by our physicians.       _____________________________________________________________  Should you have questions after your visit to  Cancer Center, please contact our office at [phone] between the hours of 8:00 a.m. and 4:30 p.m.  Voicemails left after 4:00 p.m. will not be returned until the following business day.  For prescription refill requests, have your pharmacy contact our office and allow 72 hours.    Cancer Center Support Programs:   > Cancer Support Group  2nd Tuesday of the month 1pm-2pm, Journey Room    

## 2019-05-24 NOTE — Assessment & Plan Note (Addendum)
1.  Metastatic pancreatic cancer to the liver: -Biopsy of the pancreatic tail mass on 01/05/2019, moderately to poorly differentiated adenocarcinoma. -3 cycles of FOLFIRINOX from 02/25/2019 through 03/28/2019. -CTAP on 04/04/2019 shows pancreatic mass showing 3.3 x 4.7 x 2.9 cm, hypodense lesions of the liver, largest 3.2 cm in the right hepatic lobe and 1.4 cm lesion adjacent to the gallbladder fossa.  There is a 3.3 cm hypodense lesion adjacent to the falciform ligament.  The scan was compared to prior scan from 10/21/2018. -She was thought to have progression of chemotherapy and was switched to gemcitabine and Abraxane.  She received 2 doses on 03/14/2019 and 03/28/2019 in [place]. - I do not believe she has a true progression on FOLFIRINOX. -She was started on gemcitabine and Abraxane cycle 1 day 1 on 05/17/2019. - She tolerated her day 1 very well.  Her ANC today is 900.  Hence I will dose reduce gemcitabine and Abraxane by 25%. -We will reevaluate her in 1 week.  If she has tolerability issues with missing doses, will consider day 1 and day 15 of gemcitabine and Abraxane.  2.  Abdominal pain: -She is currently taking oxycodone 10 mg every 6 hours as needed which is controlling her pain.  3.  Constipation: - She will continue Linzess which is helping.

## 2019-05-24 NOTE — Patient Instructions (Signed)
[facility] Discharge Instructions for Patients Receiving Chemotherapy  Today you received the following chemotherapy agents   To help prevent nausea and vomiting after your treatment, we encourage you to take your nausea medication   If you develop nausea and vomiting that is not controlled by your nausea medication, call the clinic.   BELOW ARE SYMPTOMS THAT SHOULD BE REPORTED IMMEDIATELY:  *FEVER GREATER THAN 100.5 F  *CHILLS WITH OR WITHOUT FEVER  NAUSEA AND VOMITING THAT IS NOT CONTROLLED WITH YOUR NAUSEA MEDICATION  *UNUSUAL SHORTNESS OF BREATH  *UNUSUAL BRUISING OR BLEEDING  TENDERNESS IN MOUTH AND THROAT WITH OR WITHOUT PRESENCE OF ULCERS  *URINARY PROBLEMS  *BOWEL PROBLEMS  UNUSUAL RASH Items with * indicate a potential emergency and should be followed up as soon as possible.  Feel free to call the clinic should you have any questions or concerns. The clinic phone number is [phone].  Please show the CHEMO ALERT CARD at check-in to the Emergency Department and triage nurse.   

## 2019-05-24 NOTE — Progress Notes (Signed)
[place], [place] [address]   CLINIC:  Medical Oncology/Hematology  PCP:  Patient, No Pcp Per No address on file None   REASON FOR VISIT:  Follow-up for metastatic pancreatic cancer to the liver.   BRIEF ONCOLOGIC HISTORY:  Oncology History  Pancreatic cancer metastasized to liver ([place])  05/09/2019 Initial Diagnosis   Pancreatic cancer metastasized to liver ([place])   05/17/2019 -  Chemotherapy   The patient had PACLitaxel-protein bound (ABRAXANE) chemo infusion 275 mg, 125 mg/m2 = 275 mg, Intravenous,  Once, 1 of 4 cycles Dose modification: 100 mg/m2 (80 % of original dose 125 mg/m2, Cycle 1, Reason: Other (see comments), Comment: neutropenia) Administration: 275 mg (05/17/2019), 225 mg (05/24/2019) ondansetron (ZOFRAN) 8 mg in sodium chloride 0.9 % 50 mL IVPB, 8 mg (100 % of original dose 8 mg), Intravenous,  Once, 1 of 1 cycle Dose modification: 8 mg (original dose 8 mg, Cycle 1) gemcitabine (GEMZAR) 2,200 mg in sodium chloride 0.9 % 250 mL chemo infusion, 2,204 mg, Intravenous,  Once, 1 of 4 cycles Dose modification: 750 mg/m2 (75 % of original dose 1,000 mg/m2, Cycle 1, Reason: Other (see comments), Comment: neutropenia) Administration: 2,200 mg (05/17/2019), 1,600 mg (05/24/2019) ondansetron (ZOFRAN) 8 mg, dexamethasone (DECADRON) 10 mg in sodium chloride 0.9 % 50 mL IVPB, , Intravenous,  Once, 1 of 4 cycles Administration:  (05/17/2019),  (05/24/2019)  for chemotherapy treatment.       CANCER STAGING: Cancer Staging No matching staging information was found for the patient.   INTERVAL HISTORY:  Ms. Fang 58 y.o. female seen for follow-up of metastatic pancreatic cancer to the liver.  She started cycle 1 day 1 on 05/17/2019 with gemcitabine and Abraxane.  Appetite is 25% and energy levels are 50%.  She has some constipation.  She requests refill for Linzess.  Denies any tingling or numbness in the extremities.  No extreme tiredness  reported.  Denies any nausea or vomiting or diarrhea.  Abdominal pain is controlled well with the current regimen.   REVIEW OF SYSTEMS:  Review of Systems  Gastrointestinal: Positive for constipation.  All other systems reviewed and are negative.    PAST MEDICAL/SURGICAL HISTORY:  Past Medical History:  Diagnosis Date   Diabetes mellitus without complication ([place])    Hypertension    Pancreatic cancer ([place])    Port-A-Cath in place 05/16/2019   Past Surgical History:  Procedure Laterality Date   ABDOMINAL HYSTERECTOMY     BACK SURGERY     KIDNEY SURGERY     per pt, had leakage   tubal ligation Left      SOCIAL HISTORY:  Social History   Socioeconomic History   Marital status: Divorced    Spouse name: Not on file   Number of children: 1   Years of education: Not on file   Highest education level: Not on file  Occupational History   Occupation: disabled  Social Designer, fashion/clothing strain: Not hard at all   Food insecurity    Worry: Never true    Inability: Never true   Transportation needs    Medical: Yes    Non-medical: Yes  Tobacco Use   Smoking status: Current Every Day Smoker    Packs/day: 1.00    Years: 45.00    Pack years: 45.00    Types: Cigarettes   Smokeless tobacco: Never Used  Substance and Sexual Activity   Alcohol use: Not Currently   Drug use: Never  Sexual activity: Not on file  Lifestyle   Physical activity    Days per week: 0 days    Minutes per session: 0 min   Stress: Not at all  Relationships   Social connections    Talks on phone: More than three times a week    Gets together: More than three times a week    Attends religious service: 1 to 4 times per year    Active member of club or organization: No    Attends meetings of clubs or organizations: Never    Relationship status: Divorced   Intimate partner violence    Fear of current or ex partner: No    Emotionally abused: No    Physically abused:  No    Forced sexual activity: No  Other Topics Concern   Not on file  Social History Narrative   Not on file    FAMILY HISTORY:  Family History  Problem Relation Age of Onset   Diabetes Mother    Hypertension Mother    Diabetes Father    Hypertension Father    Heart disease Brother     CURRENT MEDICATIONS:  Outpatient Encounter Medications as of 05/24/2019  Medication Sig   amLODipine (NORVASC) 5 MG tablet Take 5 mg by mouth 2 (two) times daily.   Gemcitabine HCl (GEMZAR IV) Inject into the vein. Days 1, 8, 15 q 28 days   insulin aspart (NOVOLOG) 100 UNIT/ML injection Inject 20 Units into the skin 3 (three) times daily before meals.   lidocaine-prilocaine (EMLA) cream Apply small amount to port a cath site and cover with plastic wrap 1 hour prior to chemotherapy appointments   lisinopril (ZESTRIL) 40 MG tablet Take 40 mg by mouth daily.   ondansetron (ZOFRAN) 4 MG tablet Take 4 mg by mouth 3 (three) times daily.   Oxycodone HCl 10 MG TABS Take 10 mg by mouth every 6 (six) hours.   PACLitaxel Protein-Bound Part (ABRAXANE IV) Inject into the vein. Days 1, 8, 15 q 28 days   prochlorperazine (COMPAZINE) 10 MG tablet Take 1 tablet (10 mg total) by mouth every 6 (six) hours as needed (Nausea or vomiting).   linaclotide (LINZESS) 72 MCG capsule Take 1 capsule (72 mcg total) by mouth daily before breakfast.   No facility-administered encounter medications on file as of 05/24/2019.     ALLERGIES:  Allergies  Allergen Reactions   Hydrocodone Hives   Tramadol Itching     PHYSICAL EXAM:  ECOG Performance status: 1  Vitals:   05/24/19 0803  BP: (!) 161/82  Pulse: 95  Resp: 18  Temp: (!) 96.4 F (35.8 C)  SpO2: 100%   Filed Weights   05/24/19 0803  Weight: 219 lb (99.3 kg)    Physical Exam Vitals signs reviewed.  Constitutional:      Appearance: Normal appearance.  Cardiovascular:     Rate and Rhythm: Normal rate and regular rhythm.     Heart  sounds: Normal heart sounds.  Pulmonary:     Effort: Pulmonary effort is normal.     Breath sounds: Normal breath sounds.  Abdominal:     General: There is no distension.     Palpations: Abdomen is soft. There is no mass.  Musculoskeletal:        General: No swelling.  Lymphadenopathy:     Cervical: No cervical adenopathy.  Skin:    General: Skin is warm.  Neurological:     General: No focal deficit present.  Mental Status: She is alert and oriented to person, place, and time.  Psychiatric:        Mood and Affect: Mood normal.        Behavior: Behavior normal.      LABORATORY DATA:  I have reviewed the labs as listed.  CBC    Component Value Date/Time   WBC 3.4 (L) 05/24/2019 0758   RBC 4.87 05/24/2019 0758   HGB 11.8 (L) 05/24/2019 0758   HCT 39.1 05/24/2019 0758   PLT 291 05/24/2019 0758   MCV 80.3 05/24/2019 0758   MCH 24.2 (L) 05/24/2019 0758   MCHC 30.2 05/24/2019 0758   RDW 21.7 (H) 05/24/2019 0758   LYMPHSABS 2.2 05/24/2019 0758   MONOABS 0.3 05/24/2019 0758   EOSABS 0.0 05/24/2019 0758   BASOSABS 0.1 05/24/2019 0758   CMP Latest Ref Rng & Units 05/24/2019 05/17/2019  Glucose 70 - 99 mg/dL 244(H) 267(H)  BUN 6 - 20 mg/dL 10 7  Creatinine 0.44 - 1.00 mg/dL 0.57 0.62  Sodium 135 - 145 mmol/L 135 133(L)  Potassium 3.5 - 5.1 mmol/L 4.0 3.6  Chloride 98 - 111 mmol/L 101 99  CO2 22 - 32 mmol/L 26 24  Calcium 8.9 - 10.3 mg/dL 9.1 8.7(L)  Total Protein 6.5 - 8.1 g/dL 7.2 7.2  Total Bilirubin 0.3 - 1.2 mg/dL 0.5 0.6  Alkaline Phos 38 - 126 U/L 76 86  AST 15 - 41 U/L 24 22  ALT 0 - 44 U/L 23 17       DIAGNOSTIC IMAGING:  I have independently reviewed the scans and discussed with the patient.   I have reviewed Venita Lick LPN's note and agree with the documentation.  I personally performed a face-to-face visit, made revisions and my assessment and plan is as follows.    ASSESSMENT & PLAN:   Pancreatic cancer metastasized to liver ([place]) 1.   Metastatic pancreatic cancer to the liver: -Biopsy of the pancreatic tail mass on 01/05/2019, moderately to poorly differentiated adenocarcinoma. -3 cycles of FOLFIRINOX from 02/25/2019 through 03/28/2019. -CTAP on 04/04/2019 shows pancreatic mass showing 3.3 x 4.7 x 2.9 cm, hypodense lesions of the liver, largest 3.2 cm in the right hepatic lobe and 1.4 cm lesion adjacent to the gallbladder fossa.  There is a 3.3 cm hypodense lesion adjacent to the falciform ligament.  The scan was compared to prior scan from 10/21/2018. -She was thought to have progression of chemotherapy and was switched to gemcitabine and Abraxane.  She received 2 doses on 03/14/2019 and 03/28/2019 in [place]. - I do not believe she has a true progression on FOLFIRINOX. -She was started on gemcitabine and Abraxane cycle 1 day 1 on 05/17/2019. - She tolerated her day 1 very well.  Her ANC today is 900.  Hence I will dose reduce gemcitabine and Abraxane by 25%. -We will reevaluate her in 1 week.  If she has tolerability issues with missing doses, will consider day 1 and day 15 of gemcitabine and Abraxane.  2.  Abdominal pain: -She is currently taking oxycodone 10 mg every 6 hours as needed which is controlling her pain.  3.  Constipation: - She will continue Linzess which is helping.  Total time spent is 25 minutes with more than 50% of the time spent face-to-face discussing treatment plan, counseling and coordination of care.    Orders placed this encounter:  No orders of the defined types were placed in this encounter.     Derek Jack, MD  [facility] [phone]

## 2019-05-24 NOTE — Progress Notes (Signed)
Pt presents today for f/u with Dr. Delton Coombes and treatment. VS within parameters for treatment. Labs pending. MAR reviewed. Pt has no complaints of pain today or any changes since the last visit.   ANC 0.8. Reported to [facility] LPN/ Dr. Delton Coombes through instant message.   Per ATravis LPN. Proceed with treatment. MD aware of ANC 0.8. Per ATravis LPN dose reduction today.   Treatment given today per MD orders. Tolerated infusion without adverse affects. Vital signs stable. No complaints at this time. Discharged from clinic ambulatory. F/U with [facility] as scheduled.

## 2019-05-31 ENCOUNTER — Inpatient Hospital Stay (HOSPITAL_COMMUNITY): Payer: Medicare HMO

## 2019-05-31 ENCOUNTER — Inpatient Hospital Stay (HOSPITAL_COMMUNITY): Payer: Medicare HMO | Attending: Hematology

## 2019-05-31 ENCOUNTER — Other Ambulatory Visit (HOSPITAL_COMMUNITY): Payer: Self-pay | Admitting: *Deleted

## 2019-05-31 ENCOUNTER — Other Ambulatory Visit: Payer: Self-pay

## 2019-05-31 ENCOUNTER — Inpatient Hospital Stay (HOSPITAL_BASED_OUTPATIENT_CLINIC_OR_DEPARTMENT_OTHER): Payer: Medicare HMO | Admitting: Hematology

## 2019-05-31 ENCOUNTER — Encounter (HOSPITAL_COMMUNITY): Payer: Self-pay | Admitting: Hematology

## 2019-05-31 VITALS — BP 142/69 | HR 72 | Temp 96.7°F | Resp 18 | Ht 69.0 in | Wt 223.2 lb

## 2019-05-31 DIAGNOSIS — Z95828 Presence of other vascular implants and grafts: Secondary | ICD-10-CM

## 2019-05-31 DIAGNOSIS — K5900 Constipation, unspecified: Secondary | ICD-10-CM | POA: Diagnosis not present

## 2019-05-31 DIAGNOSIS — I10 Essential (primary) hypertension: Secondary | ICD-10-CM | POA: Diagnosis not present

## 2019-05-31 DIAGNOSIS — F17210 Nicotine dependence, cigarettes, uncomplicated: Secondary | ICD-10-CM | POA: Insufficient documentation

## 2019-05-31 DIAGNOSIS — Z5111 Encounter for antineoplastic chemotherapy: Secondary | ICD-10-CM | POA: Insufficient documentation

## 2019-05-31 DIAGNOSIS — C787 Secondary malignant neoplasm of liver and intrahepatic bile duct: Secondary | ICD-10-CM | POA: Insufficient documentation

## 2019-05-31 DIAGNOSIS — Z794 Long term (current) use of insulin: Secondary | ICD-10-CM | POA: Diagnosis not present

## 2019-05-31 DIAGNOSIS — R109 Unspecified abdominal pain: Secondary | ICD-10-CM | POA: Diagnosis not present

## 2019-05-31 DIAGNOSIS — C252 Malignant neoplasm of tail of pancreas: Secondary | ICD-10-CM | POA: Insufficient documentation

## 2019-05-31 DIAGNOSIS — C259 Malignant neoplasm of pancreas, unspecified: Secondary | ICD-10-CM

## 2019-05-31 DIAGNOSIS — Z79899 Other long term (current) drug therapy: Secondary | ICD-10-CM | POA: Diagnosis not present

## 2019-05-31 DIAGNOSIS — E119 Type 2 diabetes mellitus without complications: Secondary | ICD-10-CM | POA: Diagnosis not present

## 2019-05-31 DIAGNOSIS — Z90710 Acquired absence of both cervix and uterus: Secondary | ICD-10-CM | POA: Insufficient documentation

## 2019-05-31 LAB — COMPREHENSIVE METABOLIC PANEL
ALT: 31 U/L (ref 0–44)
AST: 25 U/L (ref 15–41)
Albumin: 3.6 g/dL (ref 3.5–5.0)
Alkaline Phosphatase: 68 U/L (ref 38–126)
Anion gap: 9 (ref 5–15)
BUN: 10 mg/dL (ref 6–20)
CO2: 26 mmol/L (ref 22–32)
Calcium: 8.7 mg/dL — ABNORMAL LOW (ref 8.9–10.3)
Chloride: 104 mmol/L (ref 98–111)
Creatinine, Ser: 0.68 mg/dL (ref 0.44–1.00)
GFR calc Af Amer: >60 mL/min (ref >60)
GFR calc non Af Amer: >60 mL/min (ref >60)
Glucose, Bld: 183 mg/dL — ABNORMAL HIGH (ref 70–99)
Potassium: 3.7 mmol/L (ref 3.5–5.1)
Sodium: 139 mmol/L (ref 135–145)
Total Bilirubin: 0.2 mg/dL — ABNORMAL LOW (ref 0.3–1.2)
Total Protein: 6.6 g/dL (ref 6.5–8.1)

## 2019-05-31 LAB — CBC WITH DIFFERENTIAL/PLATELET
Abs Immature Granulocytes: 0.05 10*3/uL (ref 0.00–0.07)
Basophils Absolute: 0 10*3/uL (ref 0.0–0.1)
Basophils Relative: 1 %
Eosinophils Absolute: 0 10*3/uL (ref 0.0–0.5)
Eosinophils Relative: 0 %
HCT: 35.3 % — ABNORMAL LOW (ref 36.0–46.0)
Hemoglobin: 10.7 g/dL — ABNORMAL LOW (ref 12.0–15.0)
Immature Granulocytes: 2 %
Lymphocytes Relative: 68 %
Lymphs Abs: 1.8 10*3/uL (ref 0.7–4.0)
MCH: 24.5 pg — ABNORMAL LOW (ref 26.0–34.0)
MCHC: 30.3 g/dL (ref 30.0–36.0)
MCV: 81 fL (ref 80.0–100.0)
Monocytes Absolute: 0.2 10*3/uL (ref 0.1–1.0)
Monocytes Relative: 7 %
Neutro Abs: 0.6 10*3/uL — ABNORMAL LOW (ref 1.7–7.7)
Neutrophils Relative %: 22 %
Platelets: 86 10*3/uL — ABNORMAL LOW (ref 150–400)
RBC: 4.36 MIL/uL (ref 3.87–5.11)
RDW: 21.2 % — ABNORMAL HIGH (ref 11.5–15.5)
WBC: 2.6 10*3/uL — ABNORMAL LOW (ref 4.0–10.5)
nRBC: 0.8 % — ABNORMAL HIGH (ref 0.0–0.2)

## 2019-05-31 MED ORDER — SODIUM CHLORIDE 0.9 % IV SOLN
Freq: Once | INTRAVENOUS | Status: AC
  Administered 2019-05-31: 11:00:00 via INTRAVENOUS

## 2019-05-31 MED ORDER — OXYCODONE HCL 10 MG PO TABS: 10.0000 mg | ORAL_TABLET | Freq: Four times a day (QID) | ORAL | 0 refills | Status: DC | PRN

## 2019-05-31 MED ORDER — SODIUM CHLORIDE 0.9 % IV SOLN
1600.0000 mg | Freq: Once | INTRAVENOUS | Status: AC
  Administered 2019-05-31: 1600 mg via INTRAVENOUS
  Filled 2019-05-31: qty 42.08

## 2019-05-31 MED ORDER — HEPARIN SOD (PORK) LOCK FLUSH 100 UNIT/ML IV SOLN
500.0000 [IU] | Freq: Once | INTRAVENOUS | Status: AC | PRN
  Administered 2019-05-31: 500 [IU]

## 2019-05-31 MED ORDER — SODIUM CHLORIDE 0.9 % IV SOLN
Freq: Once | INTRAVENOUS | Status: AC
  Administered 2019-05-31: 11:00:00 via INTRAVENOUS
  Filled 2019-05-31: qty 4

## 2019-05-31 MED ORDER — SODIUM CHLORIDE 0.9% FLUSH
10.0000 mL | INTRAVENOUS | Status: DC | PRN
  Administered 2019-05-31: 10 mL
  Filled 2019-05-31: qty 10

## 2019-05-31 MED ORDER — PACLITAXEL PROTEIN-BOUND CHEMO INJECTION 100 MG
93.7500 mg/m2 | Freq: Once | INTRAVENOUS | Status: AC
  Administered 2019-05-31: 12:00:00 200 mg via INTRAVENOUS
  Filled 2019-05-31: qty 40

## 2019-05-31 MED ORDER — SODIUM CHLORIDE 0.9 % IV SOLN: Freq: Once | INTRAVENOUS | Status: AC

## 2019-05-31 NOTE — Progress Notes (Signed)
Labs reviewed with MD today. ANC 600, platelets 86.  Proceed with treatment.   Treatment given per orders. Patient tolerated it well without problems. Vitals stable and discharged home from clinic ambulatory. Follow up as scheduled.

## 2019-05-31 NOTE — Patient Instructions (Signed)
[facility] at [facility] Discharge Instructions  Labs drawn from portacath today   Thank you for choosing [facility] at [facility] to provide your oncology and hematology care.  To afford each patient quality time with our provider, please arrive at least 15 minutes before your scheduled appointment time.   If you have a lab appointment with the Cancer Center please come in thru the Main Entrance and check in at the main information desk.  You need to re-schedule your appointment should you arrive 10 or more minutes late.  We strive to give you quality time with our providers, and arriving late affects you and other patients whose appointments are after yours.  Also, if you no show three or more times for appointments you may be dismissed from the clinic at the providers discretion.     Again, thank you for choosing [facility].  Our hope is that these requests will decrease the amount of time that you wait before being seen by our physicians.       _____________________________________________________________  Should you have questions after your visit to [facility], please contact our office at [phone] between the hours of 8:00 a.m. and 4:30 p.m.  Voicemails left after 4:00 p.m. will not be returned until the following business day.  For prescription refill requests, have your pharmacy contact our office and allow 72 hours.    Due to Covid, you will need to wear a mask upon entering the hospital. If you do not have a mask, a mask will be given to you at the Main Entrance upon arrival. For doctor visits, patients may have 1 support person with them. For treatment visits, patients can not have anyone with them due to social distancing guidelines and our immunocompromised population.     

## 2019-05-31 NOTE — Assessment & Plan Note (Signed)
1.  Metastatic pancreatic cancer to the liver: -Biopsy of the pancreatic tail mass on 01/05/2019, moderately to poorly differentiated adenocarcinoma. -3 cycles of FOLFIRINOX from 02/25/2019 through 03/28/2019. -CTAP on 04/04/2019 shows pancreatic mass showing 3.3 x 4.7 x 2.9 cm, hypodense lesions of the liver, largest 3.2 cm in the right hepatic lobe and 1.4 cm lesion adjacent to the gallbladder fossa.  There is a 3.3 cm hypodense lesion adjacent to the falciform ligament.  The scan was compared to prior scan from 10/21/2018. -She was thought to have progression of chemotherapy and was switched to gemcitabine and Abraxane.  She received 2 doses on 03/14/2019 and 03/28/2019 in [place]. - I do not believe she has a true progression on FOLFIRINOX. -She was started on gemcitabine and Abraxane cycle 1 day 1 on 05/17/2019. - She tolerated day 8 of gemcitabine and Abraxane dose reduced very well.  Did not have any GI side effects. -We reviewed her blood work today.  ANC is 600.  We will continue to maintain dose reduction of gemcitabine and Abraxane. -She will be evaluated again in 2 weeks for starting of cycle 2.  We will follow-up on CA 19-9 levels. - If cytopenias continuing to be a problem, we will switch therapy to day 1 and day 15.  2.  Abdominal pain: -She was taking oxycodone 10 mg every 6 hours.  She reportedly ran out of pills. - We have sent a refill to her pharmacy.  3.  Constipation: -She will continue Linzess which is helping.

## 2019-05-31 NOTE — Patient Instructions (Signed)
[facility] Discharge Instructions for Patients Receiving Chemotherapy  Today you received the following chemotherapy agents   To help prevent nausea and vomiting after your treatment, we encourage you to take your nausea medication   If you develop nausea and vomiting that is not controlled by your nausea medication, call the clinic.   BELOW ARE SYMPTOMS THAT SHOULD BE REPORTED IMMEDIATELY:  *FEVER GREATER THAN 100.5 F  *CHILLS WITH OR WITHOUT FEVER  NAUSEA AND VOMITING THAT IS NOT CONTROLLED WITH YOUR NAUSEA MEDICATION  *UNUSUAL SHORTNESS OF BREATH  *UNUSUAL BRUISING OR BLEEDING  TENDERNESS IN MOUTH AND THROAT WITH OR WITHOUT PRESENCE OF ULCERS  *URINARY PROBLEMS  *BOWEL PROBLEMS  UNUSUAL RASH Items with * indicate a potential emergency and should be followed up as soon as possible.  Feel free to call the clinic should you have any questions or concerns. The clinic phone number is [phone].  Please show the CHEMO ALERT CARD at check-in to the Emergency Department and triage nurse.   

## 2019-05-31 NOTE — Progress Notes (Signed)
Doris [name], [place] [address]   CLINIC:  Medical Oncology/Hematology  PCP:  Patient, No Pcp Per No address on file None   REASON FOR VISIT:  Follow-up for metastatic pancreatic cancer to the liver.   BRIEF ONCOLOGIC HISTORY:  Oncology History  Pancreatic cancer metastasized to liver ([place])  05/09/2019 Initial Diagnosis   Pancreatic cancer metastasized to liver ([place])   05/17/2019 -  Chemotherapy   The patient had PACLitaxel-protein bound (ABRAXANE) chemo infusion 275 mg, 125 mg/m2 = 275 mg, Intravenous,  Once, 1 of 4 cycles Dose modification: 100 mg/m2 (80 % of original dose 125 mg/m2, Cycle 1, Reason: Other (see comments), Comment: neutropenia), 93.75 mg/m2 (75 % of original dose 125 mg/m2, Cycle 1, Reason: Other (see comments), Comment: neutropenia) Administration: 275 mg (05/17/2019), 225 mg (05/24/2019), 200 mg (05/31/2019) ondansetron (ZOFRAN) 8 mg in sodium chloride 0.9 % 50 mL IVPB, 8 mg (100 % of original dose 8 mg), Intravenous,  Once, 1 of 1 cycle Dose modification: 8 mg (original dose 8 mg, Cycle 1) gemcitabine (GEMZAR) 2,200 mg in sodium chloride 0.9 % 250 mL chemo infusion, 2,204 mg, Intravenous,  Once, 1 of 4 cycles Dose modification: 750 mg/m2 (75 % of original dose 1,000 mg/m2, Cycle 1, Reason: Other (see comments), Comment: neutropenia), 750 mg/m2 (75 % of original dose 1,000 mg/m2, Cycle 1, Reason: Other (see comments), Comment: neutropenia) Administration: 2,200 mg (05/17/2019), 1,600 mg (05/24/2019), 1,600 mg (05/31/2019) ondansetron (ZOFRAN) 8 mg, dexamethasone (DECADRON) 10 mg in sodium chloride 0.9 % 50 mL IVPB, , Intravenous,  Once, 1 of 4 cycles Administration:  (05/17/2019),  (05/24/2019),  (05/31/2019)  for chemotherapy treatment.       CANCER STAGING: Cancer Staging No matching staging information was found for the patient.   INTERVAL HISTORY:  Doris [name] 58 y.o. female seen for follow-up of pancreatic cancer.  Had day 8 of  therapy last week.  She did not experience any nausea, vomiting, diarrhea or constipation.  No tingling or numbness next 2 days reported.  Has abdominal pain controlled on current regimen.  She requests pain medication refill.  Appetite and energy levels are 75%.  Mild fatigue is stable.  Constipation is well controlled with Linzess.  REVIEW OF SYSTEMS:  Review of Systems  Gastrointestinal: Positive for constipation.  All other systems reviewed and are negative.    PAST MEDICAL/SURGICAL HISTORY:  Past Medical History:  Diagnosis Date  . Diabetes mellitus without complication ([place])   . Hypertension   . Pancreatic cancer ([place])   . Port-A-Cath in place 05/16/2019   Past Surgical History:  Procedure Laterality Date  . ABDOMINAL HYSTERECTOMY    . BACK SURGERY    . KIDNEY SURGERY     per pt, had leakage  . tubal ligation Left      SOCIAL HISTORY:  Social History   Socioeconomic History  . Marital status: Divorced    Spouse name: Not on file  . Number of children: 1  . Years of education: Not on file  . Highest education level: Not on file  Occupational History  . Occupation: disabled  Social Needs  . Financial resource strain: Not hard at all  . Food insecurity    Worry: Never true    Inability: Never true  . Transportation needs    Medical: Yes    Non-medical: Yes  Tobacco Use  . Smoking status: Current Every Day Smoker    Packs/day: 1.00    Years: 45.00  Pack years: 45.00    Types: Cigarettes  . Smokeless tobacco: Never Used  Substance and Sexual Activity  . Alcohol use: Not Currently  . Drug use: Never  . Sexual activity: Not on file  Lifestyle  . Physical activity    Days per week: 0 days    Minutes per session: 0 min  . Stress: Not at all  Relationships  . Social connections    Talks on phone: More than three times a week    Gets together: More than three times a week    Attends religious service: 1 to 4 times per year    Active member of club or  organization: No    Attends meetings of clubs or organizations: Never    Relationship status: Divorced  . Intimate partner violence    Fear of current or ex partner: No    Emotionally abused: No    Physically abused: No    Forced sexual activity: No  Other Topics Concern  . Not on file  Social History Narrative  . Not on file    FAMILY HISTORY:  Family History  Problem Relation Age of Onset  . Diabetes Mother   . Hypertension Mother   . Diabetes Father   . Hypertension Father   . Heart disease Brother     CURRENT MEDICATIONS:  Outpatient Encounter Medications as of 05/31/2019  Medication Sig  . amLODipine (NORVASC) 5 MG tablet Take 5 mg by mouth 2 (two) times daily.  . Gemcitabine HCl (GEMZAR IV) Inject into the vein. Days 1, 8, 15 q 28 days  . insulin aspart (NOVOLOG) 100 UNIT/ML injection Inject 20 Units into the skin 3 (three) times daily before meals.  . lidocaine-prilocaine (EMLA) cream Apply small amount to port a cath site and cover with plastic wrap 1 hour prior to chemotherapy appointments  . linaclotide (LINZESS) 72 MCG capsule Take 1 capsule (72 mcg total) by mouth daily before breakfast.  . lisinopril (ZESTRIL) 40 MG tablet Take 40 mg by mouth daily.  . ondansetron (ZOFRAN) 4 MG tablet Take 4 mg by mouth 3 (three) times daily.  . Oxycodone HCl 10 MG TABS Take 1 tablet (10 mg total) by mouth every 6 (six) hours as needed.  Marland Kitchen PACLitaxel Protein-Bound Part (ABRAXANE IV) Inject into the vein. Days 1, 8, 15 q 28 days  . prochlorperazine (COMPAZINE) 10 MG tablet Take 1 tablet (10 mg total) by mouth every 6 (six) hours as needed (Nausea or vomiting).  . [DISCONTINUED] Oxycodone HCl 10 MG TABS Take 10 mg by mouth every 6 (six) hours.   No facility-administered encounter medications on file as of 05/31/2019.     ALLERGIES:  Allergies  Allergen Reactions  . Hydrocodone Hives  . Tramadol Itching     PHYSICAL EXAM:  ECOG Performance status: 1  There were no vitals  filed for this visit. There were no vitals filed for this visit.  Physical Exam Vitals signs reviewed.  Constitutional:      Appearance: Normal appearance.  Cardiovascular:     Rate and Rhythm: Normal rate and regular rhythm.     Heart sounds: Normal heart sounds.  Pulmonary:     Effort: Pulmonary effort is normal.     Breath sounds: Normal breath sounds.  Abdominal:     General: There is no distension.     Palpations: Abdomen is soft. There is no mass.  Musculoskeletal:        General: No swelling.  Lymphadenopathy:  Cervical: No cervical adenopathy.  Skin:    General: Skin is warm.  Neurological:     General: No focal deficit present.     Mental Status: She is alert and oriented to person, place, and time.  Psychiatric:        Mood and Affect: Mood normal.        Behavior: Behavior normal.      LABORATORY DATA:  I have reviewed the labs as listed.  CBC    Component Value Date/Time   WBC 2.6 (L) 05/31/2019 0833   RBC 4.36 05/31/2019 0833   HGB 10.7 (L) 05/31/2019 0833   HCT 35.3 (L) 05/31/2019 0833   PLT 86 (L) 05/31/2019 0833   MCV 81.0 05/31/2019 0833   MCH 24.5 (L) 05/31/2019 0833   MCHC 30.3 05/31/2019 0833   RDW 21.2 (H) 05/31/2019 0833   LYMPHSABS 1.8 05/31/2019 0833   MONOABS 0.2 05/31/2019 0833   EOSABS 0.0 05/31/2019 0833   BASOSABS 0.0 05/31/2019 0833   CMP Latest Ref Rng & Units 05/31/2019 05/24/2019 05/17/2019  Glucose 70 - 99 mg/dL 183(H) 244(H) 267(H)  BUN 6 - 20 mg/dL 10 10 7   Creatinine 0.44 - 1.00 mg/dL 0.68 0.57 0.62  Sodium 135 - 145 mmol/L 139 135 133(L)  Potassium 3.5 - 5.1 mmol/L 3.7 4.0 3.6  Chloride 98 - 111 mmol/L 104 101 99  CO2 22 - 32 mmol/L 26 26 24   Calcium 8.9 - 10.3 mg/dL 8.7(L) 9.1 8.7(L)  Total Protein 6.5 - 8.1 g/dL 6.6 7.2 7.2  Total Bilirubin 0.3 - 1.2 mg/dL 0.2(L) 0.5 0.6  Alkaline Phos 38 - 126 U/L 68 76 86  AST 15 - 41 U/L 25 24 22   ALT 0 - 44 U/L 31 23 17        DIAGNOSTIC IMAGING:  I have independently  reviewed the scans and discussed with the patient.   I have reviewed Venita Lick LPN's note and agree with the documentation.  I personally performed a face-to-face visit, made revisions and my assessment and plan is as follows.    ASSESSMENT & PLAN:   Pancreatic cancer metastasized to liver ([place]) 1.  Metastatic pancreatic cancer to the liver: -Biopsy of the pancreatic tail mass on 01/05/2019, moderately to poorly differentiated adenocarcinoma. -3 cycles of FOLFIRINOX from 02/25/2019 through 03/28/2019. -CTAP on 04/04/2019 shows pancreatic mass showing 3.3 x 4.7 x 2.9 cm, hypodense lesions of the liver, largest 3.2 cm in the right hepatic lobe and 1.4 cm lesion adjacent to the gallbladder fossa.  There is a 3.3 cm hypodense lesion adjacent to the falciform ligament.  The scan was compared to prior scan from 10/21/2018. -She was thought to have progression of chemotherapy and was switched to gemcitabine and Abraxane.  She received 2 doses on 03/14/2019 and 03/28/2019 in [place]. - I do not believe she has a true progression on FOLFIRINOX. -She was started on gemcitabine and Abraxane cycle 1 day 1 on 05/17/2019. - She tolerated day 8 of gemcitabine and Abraxane dose reduced very well.  Did not have any GI side effects. -We reviewed her blood work today.  ANC is 600.  We will continue to maintain dose reduction of gemcitabine and Abraxane. -She will be evaluated again in 2 weeks for starting of cycle 2.  We will follow-up on CA 19-9 levels. - If cytopenias continuing to be a problem, we will switch therapy to day 1 and day 15.  2.  Abdominal pain: -She was taking oxycodone 10  mg every 6 hours.  She reportedly ran out of pills. - We have sent a refill to her pharmacy.  3.  Constipation: -She will continue Linzess which is helping.   Total time spent is 25 minutes with more than 50% of the time spent face-to-face discussing treatment plan, counseling and coordination of care.     Orders placed this encounter:  Orders Placed This Encounter  Procedures  . CBC with Differential/Platelet  . Comprehensive metabolic panel  . Cancer antigen 19-9      Derek Jack, MD [place] [phone]

## 2019-05-31 NOTE — Progress Notes (Signed)
05/31/19  Confirmed with RN dose reductions as follows:  Abraxane and Gemzar at 25% dose reductions from original doses for today.  Henreitta Leber, PharmD

## 2019-05-31 NOTE — Patient Instructions (Addendum)
[facility] at [facility] Discharge Instructions  You were seen today by Dr. Katragadda. He went over your recent lab results. He will see you back in 2 weeks for labs and follow up.   Thank you for choosing [facility] at [facility] to provide your oncology and hematology care.  To afford each patient quality time with our provider, please arrive at least 15 minutes before your scheduled appointment time.   If you have a lab appointment with the Cancer Center please come in thru the  Main Entrance and check in at the main information desk  You need to re-schedule your appointment should you arrive 10 or more minutes late.  We strive to give you quality time with our providers, and arriving late affects you and other patients whose appointments are after yours.  Also, if you no show three or more times for appointments you may be dismissed from the clinic at the providers discretion.     Again, thank you for choosing [facility].  Our hope is that these requests will decrease the amount of time that you wait before being seen by our physicians.       _____________________________________________________________  Should you have questions after your visit to [facility], please contact our office at [phone] between the hours of 8:00 a.m. and 4:30 p.m.  Voicemails left after 4:00 p.m. will not be returned until the following business day.  For prescription refill requests, have your pharmacy contact our office and allow 72 hours.    Cancer Center Support Programs:   > Cancer Support Group  2nd Tuesday of the month 1pm-2pm, Journey Room    

## 2019-06-01 ENCOUNTER — Other Ambulatory Visit (HOSPITAL_COMMUNITY): Payer: Self-pay | Admitting: *Deleted

## 2019-06-01 ENCOUNTER — Telehealth (HOSPITAL_COMMUNITY): Payer: Self-pay | Admitting: *Deleted

## 2019-06-01 DIAGNOSIS — Z95828 Presence of other vascular implants and grafts: Secondary | ICD-10-CM

## 2019-06-01 DIAGNOSIS — C259 Malignant neoplasm of pancreas, unspecified: Secondary | ICD-10-CM

## 2019-06-01 MED ORDER — PROCHLORPERAZINE MALEATE 10 MG PO TABS: 10.0000 mg | ORAL_TABLET | Freq: Four times a day (QID) | ORAL | 1 refills | Status: DC | PRN

## 2019-06-01 MED ORDER — INSULIN ASPART 100 UNIT/ML ~~LOC~~ SOLN: 20.0000 [IU] | Freq: Three times a day (TID) | SUBCUTANEOUS | 3 refills | Status: DC

## 2019-06-01 MED ORDER — ONDANSETRON HCL 4 MG PO TABS: 4.0000 mg | ORAL_TABLET | Freq: Three times a day (TID) | ORAL | 3 refills | Status: DC

## 2019-06-01 MED ORDER — LINACLOTIDE 72 MCG PO CAPS: 72.0000 ug | ORAL_CAPSULE | Freq: Every day | ORAL | 2 refills | Status: DC

## 2019-06-01 MED ORDER — AMLODIPINE BESYLATE 5 MG PO TABS: 5.0000 mg | ORAL_TABLET | Freq: Two times a day (BID) | ORAL | 3 refills | Status: DC

## 2019-06-01 MED ORDER — LISINOPRIL 40 MG PO TABS: 40.0000 mg | ORAL_TABLET | Freq: Every day | ORAL | 3 refills | Status: DC

## 2019-06-01 MED ORDER — LIDOCAINE-PRILOCAINE 2.5-2.5 % EX CREA: TOPICAL_CREAM | CUTANEOUS | 3 refills | Status: DC

## 2019-06-01 MED ORDER — OXYCODONE HCL 10 MG PO TABS: 10.0000 mg | ORAL_TABLET | Freq: Four times a day (QID) | ORAL | 0 refills | Status: DC | PRN
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
## 2019-06-09 ENCOUNTER — Encounter (HOSPITAL_COMMUNITY): Payer: Medicare HMO | Admitting: Genetic Counselor

## 2019-06-09 ENCOUNTER — Inpatient Hospital Stay (HOSPITAL_COMMUNITY): Payer: Medicare HMO

## 2019-06-14 ENCOUNTER — Inpatient Hospital Stay (HOSPITAL_COMMUNITY): Payer: Medicare HMO

## 2019-06-14 ENCOUNTER — Encounter (HOSPITAL_COMMUNITY): Payer: Self-pay | Admitting: Hematology

## 2019-06-14 ENCOUNTER — Inpatient Hospital Stay (HOSPITAL_BASED_OUTPATIENT_CLINIC_OR_DEPARTMENT_OTHER): Payer: Medicare HMO | Admitting: Hematology

## 2019-06-14 ENCOUNTER — Other Ambulatory Visit: Payer: Self-pay

## 2019-06-14 VITALS — BP 127/61 | HR 88 | Temp 97.9°F | Resp 16

## 2019-06-14 DIAGNOSIS — C259 Malignant neoplasm of pancreas, unspecified: Secondary | ICD-10-CM | POA: Diagnosis not present

## 2019-06-14 DIAGNOSIS — C787 Secondary malignant neoplasm of liver and intrahepatic bile duct: Secondary | ICD-10-CM

## 2019-06-14 DIAGNOSIS — Z95828 Presence of other vascular implants and grafts: Secondary | ICD-10-CM

## 2019-06-14 DIAGNOSIS — Z5111 Encounter for antineoplastic chemotherapy: Secondary | ICD-10-CM | POA: Diagnosis not present

## 2019-06-14 LAB — COMPREHENSIVE METABOLIC PANEL
ALT: 25 U/L (ref 0–44)
AST: 22 U/L (ref 15–41)
Albumin: 3.5 g/dL (ref 3.5–5.0)
Alkaline Phosphatase: 76 U/L (ref 38–126)
Anion gap: 10 (ref 5–15)
BUN: 7 mg/dL (ref 6–20)
CO2: 27 mmol/L (ref 22–32)
Calcium: 8.8 mg/dL — ABNORMAL LOW (ref 8.9–10.3)
Chloride: 100 mmol/L (ref 98–111)
Creatinine, Ser: 0.51 mg/dL (ref 0.44–1.00)
GFR calc Af Amer: >60 mL/min (ref >60)
GFR calc non Af Amer: >60 mL/min (ref >60)
Glucose, Bld: 133 mg/dL — ABNORMAL HIGH (ref 70–99)
Potassium: 3.8 mmol/L (ref 3.5–5.1)
Sodium: 137 mmol/L (ref 135–145)
Total Bilirubin: 0.6 mg/dL (ref 0.3–1.2)
Total Protein: 6.7 g/dL (ref 6.5–8.1)

## 2019-06-14 LAB — CBC WITH DIFFERENTIAL/PLATELET
Abs Immature Granulocytes: 0.05 10*3/uL (ref 0.00–0.07)
Basophils Absolute: 0 10*3/uL (ref 0.0–0.1)
Basophils Relative: 1 %
Eosinophils Absolute: 0.2 10*3/uL (ref 0.0–0.5)
Eosinophils Relative: 3 %
HCT: 33.6 % — ABNORMAL LOW (ref 36.0–46.0)
Hemoglobin: 10.2 g/dL — ABNORMAL LOW (ref 12.0–15.0)
Immature Granulocytes: 1 %
Lymphocytes Relative: 25 %
Lymphs Abs: 1.9 10*3/uL (ref 0.7–4.0)
MCH: 25.6 pg — ABNORMAL LOW (ref 26.0–34.0)
MCHC: 30.4 g/dL (ref 30.0–36.0)
MCV: 84.4 fL (ref 80.0–100.0)
Monocytes Absolute: 1.2 10*3/uL — ABNORMAL HIGH (ref 0.1–1.0)
Monocytes Relative: 16 %
Neutro Abs: 4 10*3/uL (ref 1.7–7.7)
Neutrophils Relative %: 54 %
Platelets: 355 10*3/uL (ref 150–400)
RBC: 3.98 MIL/uL (ref 3.87–5.11)
RDW: 24 % — ABNORMAL HIGH (ref 11.5–15.5)
WBC: 7.4 10*3/uL (ref 4.0–10.5)
nRBC: 0.3 % — ABNORMAL HIGH (ref 0.0–0.2)

## 2019-06-14 MED ORDER — HEPARIN SOD (PORK) LOCK FLUSH 100 UNIT/ML IV SOLN
500.0000 [IU] | Freq: Once | INTRAVENOUS | Status: AC | PRN
  Administered 2019-06-14: 14:00:00 500 [IU]

## 2019-06-14 MED ORDER — PACLITAXEL PROTEIN-BOUND CHEMO INJECTION 100 MG
100.0000 mg/m2 | Freq: Once | INTRAVENOUS | Status: AC
  Administered 2019-06-14: 225 mg via INTRAVENOUS
  Filled 2019-06-14: qty 45

## 2019-06-14 MED ORDER — SODIUM CHLORIDE 0.9 % IV SOLN
Freq: Once | INTRAVENOUS | Status: AC
  Administered 2019-06-14: 11:00:00 via INTRAVENOUS
  Filled 2019-06-14: qty 4

## 2019-06-14 MED ORDER — SODIUM CHLORIDE 0.9 % IV SOLN
Freq: Once | INTRAVENOUS | Status: AC
  Administered 2019-06-14: 10:00:00 via INTRAVENOUS

## 2019-06-14 MED ORDER — SODIUM CHLORIDE 0.9 % IV SOLN
800.0000 mg/m2 | Freq: Once | INTRAVENOUS | Status: AC
  Administered 2019-06-14: 1786 mg via INTRAVENOUS
  Filled 2019-06-14: qty 20.67

## 2019-06-14 MED ORDER — SODIUM CHLORIDE 0.9% FLUSH
10.0000 mL | INTRAVENOUS | Status: DC | PRN
  Administered 2019-06-14: 09:00:00 10 mL
  Filled 2019-06-14: qty 10

## 2019-06-14 NOTE — Progress Notes (Signed)
Labs reviewed with MD today. Will proceed as planned. Patients states she has no new issues today.   Treatment given per orders. Patient tolerated it well without problems. Vitals stable and discharged home from clinic ambulatory. Follow up as scheduled.

## 2019-06-14 NOTE — Progress Notes (Signed)
06/14/19  Chemotherapy doses verified for today's treatment with Dr Delton Coombes.  Abraxane 100 mg/m2 and Gemcitabine 800 mg/m2  T.O.Dr Beckey Downing LPN/Justine Dines Ronnald Ramp, PharmD

## 2019-06-14 NOTE — Progress Notes (Signed)
[place], [place] [address]   CLINIC:  Medical Oncology/Hematology  PCP:  Patient, No Pcp Per No address on file None   REASON FOR VISIT:  Follow-up for metastatic pancreatic cancer to the liver.   BRIEF ONCOLOGIC HISTORY:  Oncology History  Pancreatic cancer metastasized to liver ([place])  05/09/2019 Initial Diagnosis   Pancreatic cancer metastasized to liver ([place])   05/17/2019 -  Chemotherapy   The patient had PACLitaxel-protein bound (ABRAXANE) chemo infusion 275 mg, 125 mg/m2 = 275 mg, Intravenous,  Once, 2 of 4 cycles Dose modification: 100 mg/m2 (80 % of original dose 125 mg/m2, Cycle 1, Reason: Other (see comments), Comment: neutropenia), 93.75 mg/m2 (75 % of original dose 125 mg/m2, Cycle 1, Reason: Other (see comments), Comment: neutropenia), 100 mg/m2 (80 % of original dose 125 mg/m2, Cycle 2, Reason: Other (see comments), Comment: cytopenias with previous treatment) Administration: 275 mg (05/17/2019), 225 mg (05/24/2019), 200 mg (05/31/2019), 225 mg (06/14/2019) ondansetron (ZOFRAN) 8 mg in sodium chloride 0.9 % 50 mL IVPB, 8 mg (100 % of original dose 8 mg), Intravenous,  Once, 1 of 1 cycle Dose modification: 8 mg (original dose 8 mg, Cycle 1) gemcitabine (GEMZAR) 2,200 mg in sodium chloride 0.9 % 250 mL chemo infusion, 2,204 mg, Intravenous,  Once, 2 of 4 cycles Dose modification: 750 mg/m2 (75 % of original dose 1,000 mg/m2, Cycle 1, Reason: Other (see comments), Comment: neutropenia), 750 mg/m2 (75 % of original dose 1,000 mg/m2, Cycle 1, Reason: Other (see comments), Comment: neutropenia), 800 mg/m2 (80 % of original dose 1,000 mg/m2, Cycle 2, Reason: Other (see comments), Comment: cytopenia) Administration: 2,200 mg (05/17/2019), 1,600 mg (05/24/2019), 1,600 mg (05/31/2019), 1,786 mg (06/14/2019) ondansetron (ZOFRAN) 8 mg, dexamethasone (DECADRON) 10 mg in sodium chloride 0.9 % 50 mL IVPB, , Intravenous,  Once, 2 of 4 cycles Administration:   (05/17/2019),  (05/24/2019),  (05/31/2019),  (06/14/2019)  for chemotherapy treatment.       CANCER STAGING: Cancer Staging No matching staging information was found for the patient.   INTERVAL HISTORY:  Doris [name] 58 y.o. female seen for follow-up of pancreatic cancer.  Appetite is 100%.  Energy levels are 50%.  Abdominal pain is well controlled with Percocet.  Minor leg swellings are stable.  Denies any tingling or numbness in the extremities.  Denies any fevers or infections.  Denies any nausea, vomiting, diarrhea or constipation after last treatment.  REVIEW OF SYSTEMS:  Review of Systems  Cardiovascular: Positive for leg swelling.  Gastrointestinal: Negative for constipation.  All other systems reviewed and are negative.    PAST MEDICAL/SURGICAL HISTORY:  Past Medical History:  Diagnosis Date  . Diabetes mellitus without complication ([place])   . Hypertension   . Pancreatic cancer ([place])   . Port-A-Cath in place 05/16/2019   Past Surgical History:  Procedure Laterality Date  . ABDOMINAL HYSTERECTOMY    . BACK SURGERY    . KIDNEY SURGERY     per pt, had leakage  . tubal ligation Left      SOCIAL HISTORY:  Social History   Socioeconomic History  . Marital status: Divorced    Spouse name: Not on file  . Number of children: 1  . Years of education: Not on file  . Highest education level: Not on file  Occupational History  . Occupation: disabled  Social Needs  . Financial resource strain: Not hard at all  . Food insecurity    Worry: Never true  Inability: Never true  . Transportation needs    Medical: Yes    Non-medical: Yes  Tobacco Use  . Smoking status: Current Every Day Smoker    Packs/day: 1.00    Years: 45.00    Pack years: 45.00    Types: Cigarettes  . Smokeless tobacco: Never Used  Substance and Sexual Activity  . Alcohol use: Not Currently  . Drug use: Never  . Sexual activity: Not on file  Lifestyle  . Physical activity    Days per week: 0  days    Minutes per session: 0 min  . Stress: Not at all  Relationships  . Social connections    Talks on phone: More than three times a week    Gets together: More than three times a week    Attends religious service: 1 to 4 times per year    Active member of club or organization: No    Attends meetings of clubs or organizations: Never    Relationship status: Divorced  . Intimate partner violence    Fear of current or ex partner: No    Emotionally abused: No    Physically abused: No    Forced sexual activity: No  Other Topics Concern  . Not on file  Social History Narrative  . Not on file    FAMILY HISTORY:  Family History  Problem Relation Age of Onset  . Diabetes Mother   . Hypertension Mother   . Diabetes Father   . Hypertension Father   . Heart disease Brother     CURRENT MEDICATIONS:  Outpatient Encounter Medications as of 06/14/2019  Medication Sig  . amLODipine (NORVASC) 5 MG tablet Take 1 tablet (5 mg total) by mouth 2 (two) times daily.  . Gemcitabine HCl (GEMZAR IV) Inject into the vein. Days 1, 8, 15 q 28 days  . insulin aspart (NOVOLOG) 100 UNIT/ML injection Inject 20 Units into the skin 3 (three) times daily before meals.  . lidocaine-prilocaine (EMLA) cream Apply small amount to port a cath site and cover with plastic wrap 1 hour prior to chemotherapy appointments  . linaclotide (LINZESS) 72 MCG capsule Take 1 capsule (72 mcg total) by mouth daily before breakfast.  . lisinopril (ZESTRIL) 40 MG tablet Take 1 tablet (40 mg total) by mouth daily.  . ondansetron (ZOFRAN) 4 MG tablet Take 1 tablet (4 mg total) by mouth 3 (three) times daily.  . Oxycodone HCl 10 MG TABS Take 1 tablet (10 mg total) by mouth every 6 (six) hours as needed.  Marland Kitchen PACLitaxel Protein-Bound Part (ABRAXANE IV) Inject into the vein. Days 1, 8, 15 q 28 days  . prochlorperazine (COMPAZINE) 10 MG tablet Take 1 tablet (10 mg total) by mouth every 6 (six) hours as needed (Nausea or vomiting).    No facility-administered encounter medications on file as of 06/14/2019.     ALLERGIES:  Allergies  Allergen Reactions  . Hydrocodone Hives  . Tramadol Itching     PHYSICAL EXAM:  ECOG Performance status: 1  Vitals:   06/14/19 0900  BP: (!) 149/60  Pulse: 86  Resp: 16  Temp: (!) 96.9 F (36.1 C)  SpO2: 95%   Filed Weights   06/14/19 0900  Weight: 229 lb 3 oz (104 kg)    Physical Exam Vitals signs reviewed.  Constitutional:      Appearance: Normal appearance.  Cardiovascular:     Rate and Rhythm: Normal rate and regular rhythm.     Heart sounds: Normal heart  sounds.  Pulmonary:     Effort: Pulmonary effort is normal.     Breath sounds: Normal breath sounds.  Abdominal:     General: There is no distension.     Palpations: Abdomen is soft. There is no mass.  Musculoskeletal:        General: No swelling.  Lymphadenopathy:     Cervical: No cervical adenopathy.  Skin:    General: Skin is warm.  Neurological:     General: No focal deficit present.     Mental Status: She is alert and oriented to person, place, and time.  Psychiatric:        Mood and Affect: Mood normal.        Behavior: Behavior normal.      LABORATORY DATA:  I have reviewed the labs as listed.  CBC    Component Value Date/Time   WBC 7.4 06/14/2019 0925   RBC 3.98 06/14/2019 0925   HGB 10.2 (L) 06/14/2019 0925   HCT 33.6 (L) 06/14/2019 0925   PLT 355 06/14/2019 0925   MCV 84.4 06/14/2019 0925   MCH 25.6 (L) 06/14/2019 0925   MCHC 30.4 06/14/2019 0925   RDW 24.0 (H) 06/14/2019 0925   LYMPHSABS 1.9 06/14/2019 0925   MONOABS 1.2 (H) 06/14/2019 0925   EOSABS 0.2 06/14/2019 0925   BASOSABS 0.0 06/14/2019 0925   CMP Latest Ref Rng & Units 06/14/2019 05/31/2019 05/24/2019  Glucose 70 - 99 mg/dL 133(H) 183(H) 244(H)  BUN 6 - 20 mg/dL 7 10 10   Creatinine 0.44 - 1.00 mg/dL 0.51 0.68 0.57  Sodium 135 - 145 mmol/L 137 139 135  Potassium 3.5 - 5.1 mmol/L 3.8 3.7 4.0  Chloride 98 - 111 mmol/L  100 104 101  CO2 22 - 32 mmol/L 27 26 26   Calcium 8.9 - 10.3 mg/dL 8.8(L) 8.7(L) 9.1  Total Protein 6.5 - 8.1 g/dL 6.7 6.6 7.2  Total Bilirubin 0.3 - 1.2 mg/dL 0.6 0.2(L) 0.5  Alkaline Phos 38 - 126 U/L 76 68 76  AST 15 - 41 U/L 22 25 24   ALT 0 - 44 U/L 25 31 23        DIAGNOSTIC IMAGING:  I have independently reviewed the scans and discussed with the patient.   I have reviewed Venita Lick LPN's note and agree with the documentation.  I personally performed a face-to-face visit, made revisions and my assessment and plan is as follows.    ASSESSMENT & PLAN:   Pancreatic cancer metastasized to liver ([place]) 1.  Metastatic pancreatic cancer to the liver: -Biopsy of the pancreatic tail mass on 01/05/2019, moderately to poorly differentiated adenocarcinoma. -3 cycles of FOLFIRINOX from 02/25/2019 through 03/28/2019. -CTAP on 04/04/2019 shows pancreatic mass showing 3.3 x 4.7 x 2.9 cm, hypodense lesions of the liver, largest 3.2 cm in the right hepatic lobe and 1.4 cm lesion adjacent to the gallbladder fossa.  There is a 3.3 cm hypodense lesion adjacent to the falciform ligament.  The scan was compared to prior scan from 10/21/2018. -She was thought to have progression of chemotherapy and was switched to gemcitabine and Abraxane.  She received 2 doses on 03/14/2019 and 03/28/2019 in [place]. - I do not believe she has a true progression on FOLFIRINOX. - Cycle 1 of gemcitabine and Abraxane on 05/17/2019.  She required dose reductions during the day 8 and day 15. - Today her blood counts are adequate to proceed with cycle 2.  I will reduce chemotherapy by 25%.  If she is  having difficulty with this regimen, will consider changing it to day 1 and day 15 by illuminating day 8.  We will see her back in 1 week for follow-up and date.  2.  Abdominal pain: -She is taking oxycodone 10 mg every 6 hours.  3.  Constipation: -She takes Linzess which is helping.   Total time spent is 25  minutes with more than 50% of the time spent face-to-face discussing treatment plan, counseling and coordination of care.    Orders placed this encounter:  No orders of the defined types were placed in this encounter.     Derek Jack, MD [place] [phone]

## 2019-06-14 NOTE — Assessment & Plan Note (Signed)
1.  Metastatic pancreatic cancer to the liver: -Biopsy of the pancreatic tail mass on 01/05/2019, moderately to poorly differentiated adenocarcinoma. -3 cycles of FOLFIRINOX from 02/25/2019 through 03/28/2019. -CTAP on 04/04/2019 shows pancreatic mass showing 3.3 x 4.7 x 2.9 cm, hypodense lesions of the liver, largest 3.2 cm in the right hepatic lobe and 1.4 cm lesion adjacent to the gallbladder fossa.  There is a 3.3 cm hypodense lesion adjacent to the falciform ligament.  The scan was compared to prior scan from 10/21/2018. -She was thought to have progression of chemotherapy and was switched to gemcitabine and Abraxane.  She received 2 doses on 03/14/2019 and 03/28/2019 in [place]. - I do not believe she has a true progression on FOLFIRINOX. - Cycle 1 of gemcitabine and Abraxane on 05/17/2019.  She required dose reductions during the day 8 and day 15. - Today her blood counts are adequate to proceed with cycle 2.  I will reduce chemotherapy by 25%.  If she is having difficulty with this regimen, will consider changing it to day 1 and day 15 by illuminating day 8.  We will see her back in 1 week for follow-up and date.  2.  Abdominal pain: -She is taking oxycodone 10 mg every 6 hours.  3.  Constipation: -She takes Linzess which is helping.

## 2019-06-14 NOTE — Patient Instructions (Signed)
[facility] Discharge Instructions for Patients Receiving Chemotherapy  Today you received the following chemotherapy agents   To help prevent nausea and vomiting after your treatment, we encourage you to take your nausea medication   If you develop nausea and vomiting that is not controlled by your nausea medication, call the clinic.   BELOW ARE SYMPTOMS THAT SHOULD BE REPORTED IMMEDIATELY:  *FEVER GREATER THAN 100.5 F  *CHILLS WITH OR WITHOUT FEVER  NAUSEA AND VOMITING THAT IS NOT CONTROLLED WITH YOUR NAUSEA MEDICATION  *UNUSUAL SHORTNESS OF BREATH  *UNUSUAL BRUISING OR BLEEDING  TENDERNESS IN MOUTH AND THROAT WITH OR WITHOUT PRESENCE OF ULCERS  *URINARY PROBLEMS  *BOWEL PROBLEMS  UNUSUAL RASH Items with * indicate a potential emergency and should be followed up as soon as possible.  Feel free to call the clinic should you have any questions or concerns. The clinic phone number is [phone].  Please show the CHEMO ALERT CARD at check-in to the Emergency Department and triage nurse.   

## 2019-06-14 NOTE — Patient Instructions (Addendum)
[facility] at [facility] Discharge Instructions  You were seen today by Dr. Katragadda. He went over your recent lab results. He will see you back in 1 week for labs and follow up.   Thank you for choosing [facility] at [facility] to provide your oncology and hematology care.  To afford each patient quality time with our provider, please arrive at least 15 minutes before your scheduled appointment time.   If you have a lab appointment with the Cancer Center please come in thru the  Main Entrance and check in at the main information desk  You need to re-schedule your appointment should you arrive 10 or more minutes late.  We strive to give you quality time with our providers, and arriving late affects you and other patients whose appointments are after yours.  Also, if you no show three or more times for appointments you may be dismissed from the clinic at the providers discretion.     Again, thank you for choosing [facility].  Our hope is that these requests will decrease the amount of time that you wait before being seen by our physicians.       _____________________________________________________________  Should you have questions after your visit to [facility], please contact our office at [phone] between the hours of 8:00 a.m. and 4:30 p.m.  Voicemails left after 4:00 p.m. will not be returned until the following business day.  For prescription refill requests, have your pharmacy contact our office and allow 72 hours.    Cancer Center Support Programs:   > Cancer Support Group  2nd Tuesday of the month 1pm-2pm, Journey Room    

## 2019-06-17 LAB — CANCER ANTIGEN 19-9: CA 19-9: <2 U/mL (ref 0–35)

## 2019-06-20 ENCOUNTER — Encounter (HOSPITAL_COMMUNITY): Payer: Self-pay | Admitting: *Deleted

## 2019-06-20 NOTE — Progress Notes (Signed)
Patient called the clinic today stating that she didn't feel well and wasn't going to come for her treatment tomorrow. I asked if she would like to come in for fluids and she was very adamant that she wasn't coming and  yelled "I don't want to come in".  I expressed my understanding and stated that I will cancel her appointment tomorrow and let the provider know.

## 2019-06-21 ENCOUNTER — Ambulatory Visit (HOSPITAL_COMMUNITY): Payer: Medicare HMO | Admitting: Hematology

## 2019-06-21 ENCOUNTER — Other Ambulatory Visit (HOSPITAL_COMMUNITY): Payer: Medicare HMO

## 2019-06-21 ENCOUNTER — Ambulatory Visit (HOSPITAL_COMMUNITY): Payer: Medicare HMO

## 2019-06-27 ENCOUNTER — Ambulatory Visit (HOSPITAL_COMMUNITY): Payer: Medicare HMO | Admitting: Hematology

## 2019-06-27 ENCOUNTER — Other Ambulatory Visit (HOSPITAL_COMMUNITY): Payer: Medicare HMO

## 2019-06-28 ENCOUNTER — Inpatient Hospital Stay (HOSPITAL_COMMUNITY): Payer: Medicare HMO

## 2019-06-28 ENCOUNTER — Encounter (HOSPITAL_COMMUNITY): Payer: Self-pay | Admitting: Hematology

## 2019-06-28 ENCOUNTER — Other Ambulatory Visit: Payer: Self-pay

## 2019-06-28 ENCOUNTER — Inpatient Hospital Stay (HOSPITAL_BASED_OUTPATIENT_CLINIC_OR_DEPARTMENT_OTHER): Payer: Medicare HMO | Admitting: Hematology

## 2019-06-28 ENCOUNTER — Other Ambulatory Visit (HOSPITAL_COMMUNITY): Payer: Medicare HMO

## 2019-06-28 VITALS — BP 118/58 | HR 89 | Temp 96.8°F | Resp 16 | Wt 230.0 lb

## 2019-06-28 VITALS — BP 135/81 | HR 78 | Temp 98.4°F | Resp 18

## 2019-06-28 DIAGNOSIS — Z95828 Presence of other vascular implants and grafts: Secondary | ICD-10-CM

## 2019-06-28 DIAGNOSIS — C787 Secondary malignant neoplasm of liver and intrahepatic bile duct: Secondary | ICD-10-CM

## 2019-06-28 DIAGNOSIS — C259 Malignant neoplasm of pancreas, unspecified: Secondary | ICD-10-CM

## 2019-06-28 DIAGNOSIS — Z5111 Encounter for antineoplastic chemotherapy: Secondary | ICD-10-CM | POA: Diagnosis not present

## 2019-06-28 LAB — CBC WITH DIFFERENTIAL/PLATELET
Abs Immature Granulocytes: 0.02 10*3/uL (ref 0.00–0.07)
Basophils Absolute: 0 10*3/uL (ref 0.0–0.1)
Basophils Relative: 1 %
Eosinophils Absolute: 0.4 10*3/uL (ref 0.0–0.5)
Eosinophils Relative: 7 %
HCT: 36.5 % (ref 36.0–46.0)
Hemoglobin: 10.7 g/dL — ABNORMAL LOW (ref 12.0–15.0)
Immature Granulocytes: 0 %
Lymphocytes Relative: 26 %
Lymphs Abs: 1.6 10*3/uL (ref 0.7–4.0)
MCH: 25.6 pg — ABNORMAL LOW (ref 26.0–34.0)
MCHC: 29.3 g/dL — ABNORMAL LOW (ref 30.0–36.0)
MCV: 87.3 fL (ref 80.0–100.0)
Monocytes Absolute: 0.8 10*3/uL (ref 0.1–1.0)
Monocytes Relative: 14 %
Neutro Abs: 3.2 10*3/uL (ref 1.7–7.7)
Neutrophils Relative %: 52 %
Platelets: 148 10*3/uL — ABNORMAL LOW (ref 150–400)
RBC: 4.18 MIL/uL (ref 3.87–5.11)
RDW: 23.7 % — ABNORMAL HIGH (ref 11.5–15.5)
WBC: 6.1 10*3/uL (ref 4.0–10.5)
nRBC: 0 % (ref 0.0–0.2)

## 2019-06-28 LAB — COMPREHENSIVE METABOLIC PANEL
ALT: 17 U/L (ref 0–44)
AST: 16 U/L (ref 15–41)
Albumin: 3.5 g/dL (ref 3.5–5.0)
Alkaline Phosphatase: 77 U/L (ref 38–126)
Anion gap: 11 (ref 5–15)
BUN: 8 mg/dL (ref 6–20)
CO2: 27 mmol/L (ref 22–32)
Calcium: 8.8 mg/dL — ABNORMAL LOW (ref 8.9–10.3)
Chloride: 99 mmol/L (ref 98–111)
Creatinine, Ser: 0.68 mg/dL (ref 0.44–1.00)
GFR calc Af Amer: >60 mL/min (ref >60)
GFR calc non Af Amer: >60 mL/min (ref >60)
Glucose, Bld: 211 mg/dL — ABNORMAL HIGH (ref 70–99)
Potassium: 3.6 mmol/L (ref 3.5–5.1)
Sodium: 137 mmol/L (ref 135–145)
Total Bilirubin: 0.3 mg/dL (ref 0.3–1.2)
Total Protein: 6.4 g/dL — ABNORMAL LOW (ref 6.5–8.1)

## 2019-06-28 MED ORDER — PACLITAXEL PROTEIN-BOUND CHEMO INJECTION 100 MG
100.0000 mg/m2 | Freq: Once | INTRAVENOUS | Status: AC
  Administered 2019-06-28: 225 mg via INTRAVENOUS
  Filled 2019-06-28: qty 45

## 2019-06-28 MED ORDER — SODIUM CHLORIDE 0.9 % IV SOLN
Freq: Once | INTRAVENOUS | Status: AC
  Administered 2019-06-28: 11:00:00 via INTRAVENOUS
  Filled 2019-06-28: qty 4

## 2019-06-28 MED ORDER — SODIUM CHLORIDE 0.9% FLUSH
10.0000 mL | INTRAVENOUS | Status: DC | PRN
  Administered 2019-06-28: 13:00:00 10 mL
  Filled 2019-06-28: qty 10

## 2019-06-28 MED ORDER — HEPARIN SOD (PORK) LOCK FLUSH 100 UNIT/ML IV SOLN
500.0000 [IU] | Freq: Once | INTRAVENOUS | Status: AC | PRN
  Administered 2019-06-28: 500 [IU]

## 2019-06-28 MED ORDER — SODIUM CHLORIDE 0.9 % IV SOLN
800.0000 mg/m2 | Freq: Once | INTRAVENOUS | Status: AC
  Administered 2019-06-28: 1786 mg via INTRAVENOUS
  Filled 2019-06-28: qty 46.97

## 2019-06-28 MED ORDER — SODIUM CHLORIDE 0.9 % IV SOLN
Freq: Once | INTRAVENOUS | Status: AC
  Administered 2019-06-28: 11:00:00 via INTRAVENOUS

## 2019-06-28 NOTE — Progress Notes (Signed)
Pt presents today for treatment and f/u visit with Dr. Delton Coombes. Labs within parameters for tx. VS within parameters for treatment. MAR reviewed. Pt has no complaints of any significant changes since the last visit. Pt states she has constipation occasional. Last BM 2 days ago.   Treatment given today per MD orders. Tolerated infusion without adverse affects. Vital signs stable. No complaints at this time. Discharged from clinic ambulatory. F/U with [facility] as scheduled.

## 2019-06-28 NOTE — Assessment & Plan Note (Signed)
1.  Metastatic pancreatic cancer to the liver: -Biopsy of the pancreatic tail mass on 01/05/2019, moderately to poorly differentiated adenocarcinoma. -Foundation 1 testing shows MS-stable, K-ras G 12 V, CDK 6 amplification, ARID1A - 3 cycles of FOLFIRINOX from 02/25/2019 through 03/28/2019. -CTAP on 04/04/2019 shows pancreatic mass measuring 3.3 x 4.7 x 2.9 cm, hypodense lesions in the liver, largest 3.2 cm in the right hepatic lobe and 1.4 cm lesion adjacent to the gallbladder fossa.  There is a 3.3 cm hypodense lesion adjacent to the falciform ligament.  Scan was compared to prior scan from 10/21/2018. - She was thought to have progression in chemotherapy was switched to gemcitabine and Abraxane.  She received 2 doses on 03/14/2019 and 03/28/2019 in [place]. -I do not believe she had a true progression on FOLFIRINOX. - Cycle 1 of gemcitabine and Abraxane on 05/17/2019.  Cycle 2-day 1 on 06/14/2019. -She missed appointment last week.  She had to go out of town.  She denied any chemotherapy induced side effects. - I have reviewed her labs today.  She will proceed with day 15 of cycle 2 today.  We will skip a day 8. -She will come back in 2 weeks for follow-up to start cycle 3.  I plan to repeat scans after cycle 3.  Her tumor marker CA 19-9 was in the normal range.  2.  Abdominal pain: -She is taking oxycodone 10 mg every 6 hours.  3.  Constipation: - She takes Linzess which is helping.

## 2019-06-28 NOTE — Progress Notes (Signed)
[address] [place], [place] [address]   CLINIC:  Medical Oncology/Hematology  PCP:  Patient, No Pcp Per No address on file None   REASON FOR VISIT:  Follow-up for metastatic pancreatic cancer to the liver.   BRIEF ONCOLOGIC HISTORY:  Oncology History  Pancreatic cancer metastasized to liver ([place])  05/09/2019 Initial Diagnosis   Pancreatic cancer metastasized to liver ([place])   05/17/2019 -  Chemotherapy   The patient had PACLitaxel-protein bound (ABRAXANE) chemo infusion 275 mg, 125 mg/m2 = 275 mg, Intravenous,  Once, 2 of 4 cycles Dose modification: 100 mg/m2 (80 % of original dose 125 mg/m2, Cycle 1, Reason: Other (see comments), Comment: neutropenia), 93.75 mg/m2 (75 % of original dose 125 mg/m2, Cycle 1, Reason: Other (see comments), Comment: neutropenia), 100 mg/m2 (80 % of original dose 125 mg/m2, Cycle 2, Reason: Other (see comments), Comment: cytopenias with previous treatment) Administration: 275 mg (05/17/2019), 225 mg (05/24/2019), 200 mg (05/31/2019), 225 mg (06/14/2019) ondansetron (ZOFRAN) 8 mg in sodium chloride 0.9 % 50 mL IVPB, 8 mg (100 % of original dose 8 mg), Intravenous,  Once, 1 of 1 cycle Dose modification: 8 mg (original dose 8 mg, Cycle 1) gemcitabine (GEMZAR) 2,200 mg in sodium chloride 0.9 % 250 mL chemo infusion, 2,204 mg, Intravenous,  Once, 2 of 4 cycles Dose modification: 750 mg/m2 (75 % of original dose 1,000 mg/m2, Cycle 1, Reason: Other (see comments), Comment: neutropenia), 750 mg/m2 (75 % of original dose 1,000 mg/m2, Cycle 1, Reason: Other (see comments), Comment: neutropenia), 800 mg/m2 (80 % of original dose 1,000 mg/m2, Cycle 2, Reason: Other (see comments), Comment: cytopenia) Administration: 2,200 mg (05/17/2019), 1,600 mg (05/24/2019), 1,600 mg (05/31/2019), 1,786 mg (06/14/2019) ondansetron (ZOFRAN) 8 mg, dexamethasone (DECADRON) 10 mg in sodium chloride 0.9 % 50 mL IVPB, , Intravenous,  Once, 2 of 4 cycles Administration:   (05/17/2019),  (05/24/2019),  (05/31/2019),  (06/14/2019)  for chemotherapy treatment.       CANCER STAGING: Cancer Staging No matching staging information was found for the patient.   INTERVAL HISTORY:  Doris [name] 58 y.o. female seen for follow-up of pancreatic cancer.  She received her last treatment on 06/14/2019.  She did not experience any chemotherapy related side effects.  Appetite and energy levels are 100%.  Denies any fevers or night sweats.  Denies any tingling or numbness in the extremities.  Abdominal pain is very well controlled.  Constipation is well controlled with Linzess.  REVIEW OF SYSTEMS:  Review of Systems  Gastrointestinal: Positive for constipation.  All other systems reviewed and are negative.    PAST MEDICAL/SURGICAL HISTORY:  Past Medical History:  Diagnosis Date  . Diabetes mellitus without complication ([place])   . Hypertension   . Pancreatic cancer ([place])   . Port-A-Cath in place 05/16/2019   Past Surgical History:  Procedure Laterality Date  . ABDOMINAL HYSTERECTOMY    . BACK SURGERY    . KIDNEY SURGERY     per pt, had leakage  . tubal ligation Left      SOCIAL HISTORY:  Social History   Socioeconomic History  . Marital status: Divorced    Spouse name: Not on file  . Number of children: 1  . Years of education: Not on file  . Highest education level: Not on file  Occupational History  . Occupation: disabled  Social Needs  . Financial resource strain: Not hard at all  . Food insecurity    Worry: Never true  Inability: Never true  . Transportation needs    Medical: Yes    Non-medical: Yes  Tobacco Use  . Smoking status: Current Every Day Smoker    Packs/day: 1.00    Years: 45.00    Pack years: 45.00    Types: Cigarettes  . Smokeless tobacco: Never Used  Substance and Sexual Activity  . Alcohol use: Not Currently  . Drug use: Never  . Sexual activity: Not on file  Lifestyle  . Physical activity    Days per week: 0 days     Minutes per session: 0 min  . Stress: Not at all  Relationships  . Social connections    Talks on phone: More than three times a week    Gets together: More than three times a week    Attends religious service: 1 to 4 times per year    Active member of club or organization: No    Attends meetings of clubs or organizations: Never    Relationship status: Divorced  . Intimate partner violence    Fear of current or ex partner: No    Emotionally abused: No    Physically abused: No    Forced sexual activity: No  Other Topics Concern  . Not on file  Social History Narrative  . Not on file    FAMILY HISTORY:  Family History  Problem Relation Age of Onset  . Diabetes Mother   . Hypertension Mother   . Diabetes Father   . Hypertension Father   . Heart disease Brother     CURRENT MEDICATIONS:  Outpatient Encounter Medications as of 06/28/2019  Medication Sig  . amLODipine (NORVASC) 5 MG tablet Take 1 tablet (5 mg total) by mouth 2 (two) times daily.  . Gemcitabine HCl (GEMZAR IV) Inject into the vein. Days 1, 8, 15 q 28 days  . insulin aspart (NOVOLOG) 100 UNIT/ML injection Inject 20 Units into the skin 3 (three) times daily before meals.  . lidocaine-prilocaine (EMLA) cream Apply small amount to port a cath site and cover with plastic wrap 1 hour prior to chemotherapy appointments  . linaclotide (LINZESS) 72 MCG capsule Take 1 capsule (72 mcg total) by mouth daily before breakfast.  . lisinopril (ZESTRIL) 40 MG tablet Take 1 tablet (40 mg total) by mouth daily.  . ondansetron (ZOFRAN) 4 MG tablet Take 1 tablet (4 mg total) by mouth 3 (three) times daily.  . Oxycodone HCl 10 MG TABS Take 1 tablet (10 mg total) by mouth every 6 (six) hours as needed.  Marland Kitchen PACLitaxel Protein-Bound Part (ABRAXANE IV) Inject into the vein. Days 1, 8, 15 q 28 days  . prochlorperazine (COMPAZINE) 10 MG tablet Take 1 tablet (10 mg total) by mouth every 6 (six) hours as needed (Nausea or vomiting).   No  facility-administered encounter medications on file as of 06/28/2019.     ALLERGIES:  Allergies  Allergen Reactions  . Hydrocodone Hives  . Tramadol Itching     PHYSICAL EXAM:  ECOG Performance status: 1  Vitals:   06/28/19 0952  BP: (!) 118/58  Pulse: 89  Resp: 16  Temp: (!) 96.8 F (36 C)  SpO2: 96%   Filed Weights   06/28/19 0952  Weight: 230 lb (104.3 kg)    Physical Exam Vitals signs reviewed.  Constitutional:      Appearance: Normal appearance.  Cardiovascular:     Rate and Rhythm: Normal rate and regular rhythm.     Heart sounds: Normal heart sounds.  Pulmonary:     Effort: Pulmonary effort is normal.     Breath sounds: Normal breath sounds.  Abdominal:     General: There is no distension.     Palpations: Abdomen is soft. There is no mass.  Musculoskeletal:        General: No swelling.  Lymphadenopathy:     Cervical: No cervical adenopathy.  Skin:    General: Skin is warm.  Neurological:     General: No focal deficit present.     Mental Status: She is alert and oriented to person, place, and time.  Psychiatric:        Mood and Affect: Mood normal.        Behavior: Behavior normal.      LABORATORY DATA:  I have reviewed the labs as listed.  CBC    Component Value Date/Time   WBC 6.1 06/28/2019 0917   RBC 4.18 06/28/2019 0917   HGB 10.7 (L) 06/28/2019 0917   HCT 36.5 06/28/2019 0917   PLT 148 (L) 06/28/2019 0917   MCV 87.3 06/28/2019 0917   MCH 25.6 (L) 06/28/2019 0917   MCHC 29.3 (L) 06/28/2019 0917   RDW 23.7 (H) 06/28/2019 0917   LYMPHSABS 1.6 06/28/2019 0917   MONOABS 0.8 06/28/2019 0917   EOSABS 0.4 06/28/2019 0917   BASOSABS 0.0 06/28/2019 0917   CMP Latest Ref Rng & Units 06/28/2019 06/14/2019 05/31/2019  Glucose 70 - 99 mg/dL 211(H) 133(H) 183(H)  BUN 6 - 20 mg/dL '8 7 10  ' Creatinine 0.44 - 1.00 mg/dL 0.68 0.51 0.68  Sodium 135 - 145 mmol/L 137 137 139  Potassium 3.5 - 5.1 mmol/L 3.6 3.8 3.7  Chloride 98 - 111 mmol/L 99 100 104   CO2 22 - 32 mmol/L '27 27 26  ' Calcium 8.9 - 10.3 mg/dL 8.8(L) 8.8(L) 8.7(L)  Total Protein 6.5 - 8.1 g/dL 6.4(L) 6.7 6.6  Total Bilirubin 0.3 - 1.2 mg/dL 0.3 0.6 0.2(L)  Alkaline Phos 38 - 126 U/L 77 76 68  AST 15 - 41 U/L '16 22 25  ' ALT 0 - 44 U/L '17 25 31       ' DIAGNOSTIC IMAGING:  I have independently reviewed the scans and discussed with the patient.   I have reviewed Doris Lick LPN's note and agree with the documentation.  I personally performed a face-to-face visit, made revisions and my assessment and plan is as follows.    ASSESSMENT & PLAN:   Pancreatic cancer metastasized to liver ([place]) 1.  Metastatic pancreatic cancer to the liver: -Biopsy of the pancreatic tail mass on 01/05/2019, moderately to poorly differentiated adenocarcinoma. -Foundation 1 testing shows MS-stable, K-ras G 12 V, CDK 6 amplification, ARID1A - 3 cycles of FOLFIRINOX from 02/25/2019 through 03/28/2019. -CTAP on 04/04/2019 shows pancreatic mass measuring 3.3 x 4.7 x 2.9 cm, hypodense lesions in the liver, largest 3.2 cm in the right hepatic lobe and 1.4 cm lesion adjacent to the gallbladder fossa.  There is a 3.3 cm hypodense lesion adjacent to the falciform ligament.  Scan was compared to prior scan from 10/21/2018. - She was thought to have progression in chemotherapy was switched to gemcitabine and Abraxane.  She received 2 doses on 03/14/2019 and 03/28/2019 in [place]. -I do not believe she had a true progression on FOLFIRINOX. - Cycle 1 of gemcitabine and Abraxane on 05/17/2019.  Cycle 2-day 1 on 06/14/2019. -She missed appointment last week.  She had to go out of town.  She denied any chemotherapy induced  side effects. - I have reviewed her labs today.  She will proceed with day 15 of cycle 2 today.  We will skip a day 8. -She will come back in 2 weeks for follow-up to start cycle 3.  I plan to repeat scans after cycle 3.  Her tumor marker CA 19-9 was in the normal range.  2.  Abdominal  pain: -She is taking oxycodone 10 mg every 6 hours.  3.  Constipation: - She takes Linzess which is helping.  Total time spent is 25 minutes with more than 50% of the time spent face-to-face discussing treatment plan, counseling and coordination of care.    Orders placed this encounter:  Orders Placed This Encounter  Procedures  . CBC with Differential/Platelet  . Comprehensive metabolic panel  . Cancer antigen 19-9      Derek Jack, MD [place] [phone]

## 2019-06-28 NOTE — Patient Instructions (Addendum)
[facility] at [facility] Discharge Instructions  You were seen today by Dr. Katragadda. He went over your recent lab results. He will see you back in 2 weeks for labs, treatment and follow up.   Thank you for choosing  Can[facility] Center at [facility] to provide your oncology and hematology care.  To afford each patient quality time with our provider, please arrive at least 15 minutes before your scheduled appointment time.   If you have a lab appointment with the Cancer Center please come in thru the  Main Entrance and check in at the main information desk  You need to re-schedule your appointment should you arrive 10 or more minutes late.  We strive to give you quality time with our providers, and arriving late affects you and other patients whose appointments are after yours.  Also, if you no show three or more times for appointments you may be dismissed from the clinic at the providers discretion.     Again, thank you for choosing [facility].  Our hope is that these requests will decrease the amount of time that you wait before being seen by our physicians.       _____________________________________________________________  Should you have questions after your visit to [facility], please contact our office at [phone] between the hours of 8:00 a.m. and 4:30 p.m.  Voicemails left after 4:00 p.m. will not be returned until the following business day.  For prescription refill requests, have your pharmacy contact our office and allow 72 hours.    Cancer Center Support Programs:   > Cancer Support Group  2nd Tuesday of the month 1pm-2pm, Journey Room    

## 2019-06-28 NOTE — Progress Notes (Signed)
06/28/19  Confirmed today is day 15.  Will start new cycle with next treatment day.  T.O. Dr Maye Hides RN/Waylen Depaolo Ronnald Ramp, PharmD

## 2019-06-28 NOTE — Patient Instructions (Signed)
[facility] at [facility]  Discharge Instructions:   _______________________________________________________________  Thank you for choosing [facility] at [facility] to provide your oncology and hematology care.  To afford each patient quality time with our providers, please arrive at least 15 minutes before your scheduled appointment.  You need to re-schedule your appointment if you arrive 10 or more minutes late.  We strive to give you quality time with our providers, and arriving late affects you and other patients whose appointments are after yours.  Also, if you no show three or more times for appointments you may be dismissed from the clinic.  Again, thank you for choosing [facility] at [facility]. Our hope is that these requests will allow you access to exceptional care and in a timely manner. _______________________________________________________________  If you have questions after your visit, please contact our office at [phone] between the hours of 8:30 a.m. and 5:00 p.m. Voicemails left after 4:30 p.m. will not be returned until the following business day. _______________________________________________________________  For prescription refill requests, have your pharmacy contact our office. _______________________________________________________________  Recommendations made by the consultant and any test results will be sent to your referring physician. _______________________________________________________________ 

## 2019-06-28 NOTE — Patient Instructions (Signed)
[facility] at [facility] Discharge Instructions  Labs drawn from portacath today   Thank you for choosing [facility] at [facility] to provide your oncology and hematology care.  To afford each patient quality time with our provider, please arrive at least 15 minutes before your scheduled appointment time.   If you have a lab appointment with the Cancer Center please come in thru the Main Entrance and check in at the main information desk.  You need to re-schedule your appointment should you arrive 10 or more minutes late.  We strive to give you quality time with our providers, and arriving late affects you and other patients whose appointments are after yours.  Also, if you no show three or more times for appointments you may be dismissed from the clinic at the providers discretion.     Again, thank you for choosing [facility].  Our hope is that these requests will decrease the amount of time that you wait before being seen by our physicians.       _____________________________________________________________  Should you have questions after your visit to [facility], please contact our office at [phone] between the hours of 8:00 a.m. and 4:30 p.m.  Voicemails left after 4:00 p.m. will not be returned until the following business day.  For prescription refill requests, have your pharmacy contact our office and allow 72 hours.    Due to Covid, you will need to wear a mask upon entering the hospital. If you do not have a mask, a mask will be given to you at the Main Entrance upon arrival. For doctor visits, patients may have 1 support person with them. For treatment visits, patients can not have anyone with them due to social distancing guidelines and our immunocompromised population.     

## 2019-07-07 ENCOUNTER — Other Ambulatory Visit (HOSPITAL_COMMUNITY): Payer: Medicare HMO

## 2019-07-07 ENCOUNTER — Encounter (HOSPITAL_COMMUNITY): Payer: Medicare HMO | Admitting: Genetic Counselor

## 2019-07-12 ENCOUNTER — Inpatient Hospital Stay (HOSPITAL_COMMUNITY): Payer: Medicare HMO | Attending: Hematology

## 2019-07-12 ENCOUNTER — Encounter (HOSPITAL_COMMUNITY): Payer: Self-pay | Admitting: Hematology

## 2019-07-12 ENCOUNTER — Inpatient Hospital Stay (HOSPITAL_COMMUNITY): Payer: Medicare HMO

## 2019-07-12 ENCOUNTER — Inpatient Hospital Stay (HOSPITAL_BASED_OUTPATIENT_CLINIC_OR_DEPARTMENT_OTHER): Payer: Medicare HMO | Admitting: Hematology

## 2019-07-12 ENCOUNTER — Other Ambulatory Visit: Payer: Self-pay

## 2019-07-12 ENCOUNTER — Encounter (HOSPITAL_COMMUNITY): Payer: Self-pay

## 2019-07-12 VITALS — BP 128/56 | HR 83 | Temp 97.6°F | Resp 18

## 2019-07-12 DIAGNOSIS — C259 Malignant neoplasm of pancreas, unspecified: Secondary | ICD-10-CM

## 2019-07-12 DIAGNOSIS — Z794 Long term (current) use of insulin: Secondary | ICD-10-CM | POA: Diagnosis not present

## 2019-07-12 DIAGNOSIS — C252 Malignant neoplasm of tail of pancreas: Secondary | ICD-10-CM | POA: Diagnosis present

## 2019-07-12 DIAGNOSIS — Z5111 Encounter for antineoplastic chemotherapy: Secondary | ICD-10-CM | POA: Insufficient documentation

## 2019-07-12 DIAGNOSIS — Z95828 Presence of other vascular implants and grafts: Secondary | ICD-10-CM

## 2019-07-12 DIAGNOSIS — E119 Type 2 diabetes mellitus without complications: Secondary | ICD-10-CM | POA: Insufficient documentation

## 2019-07-12 DIAGNOSIS — F17210 Nicotine dependence, cigarettes, uncomplicated: Secondary | ICD-10-CM | POA: Insufficient documentation

## 2019-07-12 DIAGNOSIS — I10 Essential (primary) hypertension: Secondary | ICD-10-CM | POA: Insufficient documentation

## 2019-07-12 DIAGNOSIS — R109 Unspecified abdominal pain: Secondary | ICD-10-CM | POA: Diagnosis not present

## 2019-07-12 DIAGNOSIS — Z79899 Other long term (current) drug therapy: Secondary | ICD-10-CM | POA: Diagnosis not present

## 2019-07-12 DIAGNOSIS — T451X5A Adverse effect of antineoplastic and immunosuppressive drugs, initial encounter: Secondary | ICD-10-CM | POA: Diagnosis not present

## 2019-07-12 DIAGNOSIS — K5900 Constipation, unspecified: Secondary | ICD-10-CM | POA: Insufficient documentation

## 2019-07-12 DIAGNOSIS — G620 Drug-induced polyneuropathy: Secondary | ICD-10-CM | POA: Diagnosis not present

## 2019-07-12 DIAGNOSIS — C787 Secondary malignant neoplasm of liver and intrahepatic bile duct: Secondary | ICD-10-CM | POA: Insufficient documentation

## 2019-07-12 LAB — COMPREHENSIVE METABOLIC PANEL
ALT: 21 U/L (ref 0–44)
AST: 23 U/L (ref 15–41)
Albumin: 3.5 g/dL (ref 3.5–5.0)
Alkaline Phosphatase: 71 U/L (ref 38–126)
Anion gap: 12 (ref 5–15)
BUN: 7 mg/dL (ref 6–20)
CO2: 28 mmol/L (ref 22–32)
Calcium: 9 mg/dL (ref 8.9–10.3)
Chloride: 97 mmol/L — ABNORMAL LOW (ref 98–111)
Creatinine, Ser: 0.77 mg/dL (ref 0.44–1.00)
GFR calc Af Amer: >60 mL/min (ref >60)
GFR calc non Af Amer: >60 mL/min (ref >60)
Glucose, Bld: 231 mg/dL — ABNORMAL HIGH (ref 70–99)
Potassium: 3.8 mmol/L (ref 3.5–5.1)
Sodium: 137 mmol/L (ref 135–145)
Total Bilirubin: 0.1 mg/dL — ABNORMAL LOW (ref 0.3–1.2)
Total Protein: 6.4 g/dL — ABNORMAL LOW (ref 6.5–8.1)

## 2019-07-12 LAB — CBC WITH DIFFERENTIAL/PLATELET
Abs Immature Granulocytes: 0.02 10*3/uL (ref 0.00–0.07)
Basophils Absolute: 0 10*3/uL (ref 0.0–0.1)
Basophils Relative: 1 %
Eosinophils Absolute: 0.3 10*3/uL (ref 0.0–0.5)
Eosinophils Relative: 5 %
HCT: 39.1 % (ref 36.0–46.0)
Hemoglobin: 11.1 g/dL — ABNORMAL LOW (ref 12.0–15.0)
Immature Granulocytes: 0 %
Lymphocytes Relative: 40 %
Lymphs Abs: 2 10*3/uL (ref 0.7–4.0)
MCH: 24.8 pg — ABNORMAL LOW (ref 26.0–34.0)
MCHC: 28.4 g/dL — ABNORMAL LOW (ref 30.0–36.0)
MCV: 87.5 fL (ref 80.0–100.0)
Monocytes Absolute: 0.6 10*3/uL (ref 0.1–1.0)
Monocytes Relative: 13 %
Neutro Abs: 2.1 10*3/uL (ref 1.7–7.7)
Neutrophils Relative %: 41 %
Platelets: 240 10*3/uL (ref 150–400)
RBC: 4.47 MIL/uL (ref 3.87–5.11)
RDW: 20.7 % — ABNORMAL HIGH (ref 11.5–15.5)
WBC: 5 10*3/uL (ref 4.0–10.5)
nRBC: 0 % (ref 0.0–0.2)

## 2019-07-12 MED ORDER — SODIUM CHLORIDE 0.9 % IV SOLN
Freq: Once | INTRAVENOUS | Status: AC
  Administered 2019-07-12: 09:00:00 via INTRAVENOUS

## 2019-07-12 MED ORDER — SODIUM CHLORIDE 0.9% FLUSH
10.0000 mL | INTRAVENOUS | Status: DC | PRN
  Administered 2019-07-12: 10 mL
  Filled 2019-07-12: qty 10

## 2019-07-12 MED ORDER — LINACLOTIDE 145 MCG PO CAPS: 145.0000 ug | ORAL_CAPSULE | Freq: Every day | ORAL | 2 refills | Status: DC

## 2019-07-12 MED ORDER — HEPARIN SOD (PORK) LOCK FLUSH 100 UNIT/ML IV SOLN
500.0000 [IU] | Freq: Once | INTRAVENOUS | Status: AC | PRN
  Administered 2019-07-12: 500 [IU]

## 2019-07-12 MED ORDER — SODIUM CHLORIDE 0.9 % IV SOLN
800.0000 mg/m2 | Freq: Once | INTRAVENOUS | Status: AC
  Administered 2019-07-12: 1786 mg via INTRAVENOUS
  Filled 2019-07-12: qty 26.3

## 2019-07-12 MED ORDER — SODIUM CHLORIDE 0.9 % IV SOLN
Freq: Once | INTRAVENOUS | Status: AC
  Administered 2019-07-12: 10:00:00 via INTRAVENOUS
  Filled 2019-07-12: qty 4

## 2019-07-12 MED ORDER — LACTULOSE 20 GM/30ML PO SOLN: 30.0000 mL | Freq: Every evening | ORAL | 2 refills | Status: DC | PRN

## 2019-07-12 MED ORDER — PACLITAXEL PROTEIN-BOUND CHEMO INJECTION 100 MG
100.0000 mg/m2 | Freq: Once | INTRAVENOUS | Status: AC
  Administered 2019-07-12: 225 mg via INTRAVENOUS
  Filled 2019-07-12: qty 45

## 2019-07-12 NOTE — Patient Instructions (Signed)
[facility] Discharge Instructions for Patients Receiving Chemotherapy  Today you received the following chemotherapy agents   To help prevent nausea and vomiting after your treatment, we encourage you to take your nausea medication   If you develop nausea and vomiting that is not controlled by your nausea medication, call the clinic.   BELOW ARE SYMPTOMS THAT SHOULD BE REPORTED IMMEDIATELY:  *FEVER GREATER THAN 100.5 F  *CHILLS WITH OR WITHOUT FEVER  NAUSEA AND VOMITING THAT IS NOT CONTROLLED WITH YOUR NAUSEA MEDICATION  *UNUSUAL SHORTNESS OF BREATH  *UNUSUAL BRUISING OR BLEEDING  TENDERNESS IN MOUTH AND THROAT WITH OR WITHOUT PRESENCE OF ULCERS  *URINARY PROBLEMS  *BOWEL PROBLEMS  UNUSUAL RASH Items with * indicate a potential emergency and should be followed up as soon as possible.  Feel free to call the clinic should you have any questions or concerns. The clinic phone number is [phone].  Please show the CHEMO ALERT CARD at check-in to the Emergency Department and triage nurse.   

## 2019-07-12 NOTE — Patient Instructions (Addendum)
[facility] at [facility] Discharge Instructions  You were seen today by Dr. Katragadda. He went over your recent lab results. He will see you back in 1 week for labs, treatment and follow up.   Thank you for choosing [facility] at [facility] to provide your oncology and hematology care.  To afford each patient quality time with our provider, please arrive at least 15 minutes before your scheduled appointment time.   If you have a lab appointment with the Cancer Center please come in thru the  Main Entrance and check in at the main information desk  You need to re-schedule your appointment should you arrive 10 or more minutes late.  We strive to give you quality time with our providers, and arriving late affects you and other patients whose appointments are after yours.  Also, if you no show three or more times for appointments you may be dismissed from the clinic at the providers discretion.     Again, thank you for choosing [facility].  Our hope is that these requests will decrease the amount of time that you wait before being seen by our physicians.       _____________________________________________________________  Should you have questions after your visit to [facility], please contact our office at [phone] between the hours of 8:00 a.m. and 4:30 p.m.  Voicemails left after 4:00 p.m. will not be returned until the following business day.  For prescription refill requests, have your pharmacy contact our office and allow 72 hours.    Cancer Center Support Programs:   > Cancer Support Group  2nd Tuesday of the month 1pm-2pm, Journey Room    

## 2019-07-12 NOTE — Progress Notes (Signed)
Doris [name], Doris [name] [id number]   CLINIC:  Medical Oncology/Hematology  PCP:  Patient, No Pcp Per No address on file None   REASON FOR VISIT:  Follow-up for metastatic pancreatic cancer to the liver.   BRIEF ONCOLOGIC HISTORY:  Oncology History  Pancreatic cancer metastasized to liver (Doris [name])  05/09/2019 Initial Diagnosis   Pancreatic cancer metastasized to liver (Doris [name])   05/17/2019 -  Chemotherapy   The patient had PACLitaxel-protein bound (ABRAXANE) chemo infusion 275 mg, 125 mg/m2 = 275 mg, Intravenous,  Once, 3 of 4 cycles Dose modification: 100 mg/m2 (80 % of original dose 125 mg/m2, Cycle 1, Reason: Other (see comments), Comment: neutropenia), 93.75 mg/m2 (75 % of original dose 125 mg/m2, Cycle 1, Reason: Other (see comments), Comment: neutropenia), 100 mg/m2 (80 % of original dose 125 mg/m2, Cycle 2, Reason: Other (see comments), Comment: cytopenias with previous treatment) Administration: 275 mg (05/17/2019), 225 mg (05/24/2019), 200 mg (05/31/2019), 225 mg (06/14/2019), 225 mg (06/28/2019), 225 mg (07/12/2019) ondansetron (ZOFRAN) 8 mg in sodium chloride 0.9 % 50 mL IVPB, 8 mg (100 % of original dose 8 mg), Intravenous,  Once, 1 of 1 cycle Dose modification: 8 mg (original dose 8 mg, Cycle 1) gemcitabine (GEMZAR) 2,200 mg in sodium chloride 0.9 % 250 mL chemo infusion, 2,204 mg, Intravenous,  Once, 3 of 4 cycles Dose modification: 750 mg/m2 (75 % of original dose 1,000 mg/m2, Cycle 1, Reason: Other (see comments), Comment: neutropenia), 750 mg/m2 (75 % of original dose 1,000 mg/m2, Cycle 1, Reason: Other (see comments), Comment: neutropenia), 800 mg/m2 (80 % of original dose 1,000 mg/m2, Cycle 2, Reason: Other (see comments), Comment: cytopenia) Administration: 2,200 mg (05/17/2019), 1,600 mg (05/24/2019), 1,600 mg (05/31/2019), 1,786 mg (06/14/2019), 1,786 mg (06/28/2019), 1,786 mg (07/12/2019) ondansetron (ZOFRAN) 8 mg, dexamethasone (DECADRON) 10 mg in  sodium chloride 0.9 % 50 mL IVPB, , Intravenous,  Once, 3 of 4 cycles Administration:  (05/17/2019),  (05/24/2019),  (05/31/2019),  (06/14/2019),  (06/28/2019),  (07/12/2019)  for chemotherapy treatment.       CANCER STAGING: Cancer Staging No matching staging information was found for the patient.   INTERVAL HISTORY:  [name] 58 y.o. female seen for follow-up and cycle 3 of chemotherapy.  Appetite is 100%.  Energy levels are 50%.  Last treatment was on 06/28/2019.  Denied any tingling or numbness in extremities.  Abdominal pain is fairly well controlled.  She reports constipation.  She ran out of Linzess as she doubled up on the dose.  Regular dose was not helping her.  Denies any fevers or chills.  No nausea or vomiting or diarrhea was reported.  REVIEW OF SYSTEMS:  Review of Systems  Gastrointestinal: Positive for constipation.  All other systems reviewed and are negative.    PAST MEDICAL/SURGICAL HISTORY:  Past Medical History:  Diagnosis Date  . Diabetes mellitus without complication (Doris [name])   . Hypertension   . Pancreatic cancer (Doris [name])   . Port-A-Cath in place 05/16/2019   Past Surgical History:  Procedure Laterality Date  . ABDOMINAL HYSTERECTOMY    . BACK SURGERY    . KIDNEY SURGERY     per pt, had leakage  . tubal ligation Left      SOCIAL HISTORY:  Social History   Socioeconomic History  . Marital status: Divorced    Spouse name: Not on file  . Number of children: 1  . Years of education: Not on file  . Highest education level: Not on  file  Occupational History  . Occupation: disabled  Social Needs  . Financial resource strain: Not hard at all  . Food insecurity    Worry: Never true    Inability: Never true  . Transportation needs    Medical: Yes    Non-medical: Yes  Tobacco Use  . Smoking status: Current Every Day Smoker    Packs/day: 1.00    Years: 45.00    Pack years: 45.00    Types: Cigarettes  . Smokeless tobacco: Never Used  Substance and  Sexual Activity  . Alcohol use: Not Currently  . Drug use: Never  . Sexual activity: Not on file  Lifestyle  . Physical activity    Days per week: 0 days    Minutes per session: 0 min  . Stress: Not at all  Relationships  . Social connections    Talks on phone: More than three times a week    Gets together: More than three times a week    Attends religious service: 1 to 4 times per year    Active member of club or organization: No    Attends meetings of clubs or organizations: Never    Relationship status: Divorced  . Intimate partner violence    Fear of current or ex partner: No    Emotionally abused: No    Physically abused: No    Forced sexual activity: No  Other Topics Concern  . Not on file  Social History Narrative  . Not on file    FAMILY HISTORY:  Family History  Problem Relation Age of Onset  . Diabetes Mother   . Hypertension Mother   . Diabetes Father   . Hypertension Father   . Heart disease Brother     CURRENT MEDICATIONS:  Outpatient Encounter Medications as of 07/12/2019  Medication Sig  . amLODipine (NORVASC) 5 MG tablet Take 1 tablet (5 mg total) by mouth 2 (two) times daily.  . Gemcitabine HCl (GEMZAR IV) Inject into the vein. Days 1, 8, 15 q 28 days  . insulin aspart (NOVOLOG) 100 UNIT/ML injection Inject 20 Units into the skin 3 (three) times daily before meals.  Marland Kitchen lisinopril (ZESTRIL) 40 MG tablet Take 1 tablet (40 mg total) by mouth daily.  . ondansetron (ZOFRAN) 4 MG tablet Take 1 tablet (4 mg total) by mouth 3 (three) times daily.  . Oxycodone HCl 10 MG TABS Take 1 tablet (10 mg total) by mouth every 6 (six) hours as needed.  Marland Kitchen PACLitaxel Protein-Bound Part (ABRAXANE IV) Inject into the vein. Days 1, 8, 15 q 28 days  . Lactulose 20 GM/30ML SOLN Take 30 mLs (20 g total) by mouth at bedtime as needed.  . lidocaine-prilocaine (EMLA) cream Apply small amount to port a cath site and cover with plastic wrap 1 hour prior to chemotherapy appointments  (Patient not taking: Reported on 07/12/2019)  . linaclotide (LINZESS) 145 MCG CAPS capsule Take 1 capsule (145 mcg total) by mouth daily before breakfast.  . prochlorperazine (COMPAZINE) 10 MG tablet Take 1 tablet (10 mg total) by mouth every 6 (six) hours as needed (Nausea or vomiting). (Patient not taking: Reported on 07/12/2019)  . [DISCONTINUED] linaclotide (LINZESS) 72 MCG capsule Take 1 capsule (72 mcg total) by mouth daily before breakfast.   No facility-administered encounter medications on file as of 07/12/2019.     ALLERGIES:  Allergies  Allergen Reactions  . Hydrocodone Hives  . Tramadol Itching     PHYSICAL EXAM:  ECOG Performance status:  1  Vitals:   07/12/19 0818 07/12/19 0819  BP: 120/69 120/69  Pulse: 95 95  Resp: 18 18  Temp: (!) 97.1 F (36.2 C) (!) 97.1 F (36.2 C)  SpO2: 98% 98%   Filed Weights   07/12/19 0818 07/12/19 0819  Weight: 236 lb (107 kg) 236 lb (107 kg)    Physical Exam Vitals signs reviewed.  Constitutional:      Appearance: Normal appearance.  Cardiovascular:     Rate and Rhythm: Normal rate and regular rhythm.     Heart sounds: Normal heart sounds.  Pulmonary:     Effort: Pulmonary effort is normal.     Breath sounds: Normal breath sounds.  Abdominal:     General: There is no distension.     Palpations: Abdomen is soft. There is no mass.  Musculoskeletal:        General: No swelling.  Lymphadenopathy:     Cervical: No cervical adenopathy.  Skin:    General: Skin is warm.  Neurological:     General: No focal deficit present.     Mental Status: She is alert and oriented to person, place, and time.  Psychiatric:        Mood and Affect: Mood normal.        Behavior: Behavior normal.      LABORATORY DATA:  I have reviewed the labs as listed.  CBC    Component Value Date/Time   WBC 5.0 07/12/2019 0805   RBC 4.47 07/12/2019 0805   HGB 11.1 (L) 07/12/2019 0805   HCT 39.1 07/12/2019 0805   PLT 240 07/12/2019 0805   MCV  87.5 07/12/2019 0805   MCH 24.8 (L) 07/12/2019 0805   MCHC 28.4 (L) 07/12/2019 0805   RDW 20.7 (H) 07/12/2019 0805   LYMPHSABS 2.0 07/12/2019 0805   MONOABS 0.6 07/12/2019 0805   EOSABS 0.3 07/12/2019 0805   BASOSABS 0.0 07/12/2019 0805   CMP Latest Ref Rng & Units 07/12/2019 06/28/2019 06/14/2019  Glucose 70 - 99 mg/dL 231(H) 211(H) 133(H)  BUN 6 - 20 mg/dL '7 8 7  ' Creatinine 0.44 - 1.00 mg/dL 0.77 0.68 0.51  Sodium 135 - 145 mmol/L 137 137 137  Potassium 3.5 - 5.1 mmol/L 3.8 3.6 3.8  Chloride 98 - 111 mmol/L 97(L) 99 100  CO2 22 - 32 mmol/L '28 27 27  ' Calcium 8.9 - 10.3 mg/dL 9.0 8.8(L) 8.8(L)  Total Protein 6.5 - 8.1 g/dL 6.4(L) 6.4(L) 6.7  Total Bilirubin 0.3 - 1.2 mg/dL 0.1(L) 0.3 0.6  Alkaline Phos 38 - 126 U/L 71 77 76  AST 15 - 41 U/L '23 16 22  ' ALT 0 - 44 U/L '21 17 25       ' DIAGNOSTIC IMAGING:  I have independently reviewed the scans and discussed with the patient.   I have reviewed Venita Lick LPN's note and agree with the documentation.  I personally performed a face-to-face visit, made revisions and my assessment and plan is as follows.    ASSESSMENT & PLAN:   Pancreatic cancer metastasized to liver ([place]) 1.  Metastatic pancreatic cancer to the liver: -Biopsy of the pancreatic tail mass on 01/05/2019, moderately to poorly differentiated adenocarcinoma. -Foundation 1 testing shows MS-stable, K-ras G 12 V, CDK 6 amplification, ARID1A - 3 cycles of FOLFIRINOX from 02/25/2019 through 03/28/2019. -CTAP on 04/04/2019 shows pancreatic mass measuring 3.3 x 4.7 x 2.9 cm, hypodense lesions in the liver, largest 3.2 cm in the right hepatic lobe and 1.4 cm lesion adjacent to  the gallbladder fossa.  There is a 3.3 cm hypodense lesion adjacent to the falciform ligament.  Scan was compared to prior scan from 10/21/2018. - She was thought to have progression in chemotherapy was switched to gemcitabine and Abraxane.  She received 2 doses on 03/14/2019 and 03/28/2019 in [place]. -I do not believe she had a true progression on FOLFIRINOX. - Cycle 1 of gemcitabine and Abraxane on 05/17/2019.  Cycle 2-day 1 on 06/14/2019. -She received day 15 of cycle 2 on 06/28/2019.  She missed day 8 of treatment. - She denied any major chemotherapy related problems. -We reviewed her labs today.  She will proceed with cycle 3-day 1 today.  She will come back in 1 week for follow-up and date of treatment.  She will continue 3 weeks on 1 week off for this cycle.  I plan to repeat CT scans after completion of cycle 3.  If there is very good response, we will consider illuminating day 8 of treatment.  2.  Abdominal pain: -She will continue oxycodone 10 mg every 6 hours as needed.  3.  Constipation: - She ran out of Linzess as she doubled up on the dose. -I will increase Linzess to 145 mcg daily.  I have also given prescription for lactulose.  Total time spent is 25 minutes with more than 50% of the time spent face-to-face discussing treatment plan, counseling and coordination of care.    Orders placed this encounter:  No orders of the defined types were placed in this encounter.     Derek Jack, MD [place] [phone]

## 2019-07-12 NOTE — Assessment & Plan Note (Signed)
1.  Metastatic pancreatic cancer to the liver: -Biopsy of the pancreatic tail mass on 01/05/2019, moderately to poorly differentiated adenocarcinoma. -Foundation 1 testing shows MS-stable, K-ras G 12 V, CDK 6 amplification, ARID1A - 3 cycles of FOLFIRINOX from 02/25/2019 through 03/28/2019. -CTAP on 04/04/2019 shows pancreatic mass measuring 3.3 x 4.7 x 2.9 cm, hypodense lesions in the liver, largest 3.2 cm in the right hepatic lobe and 1.4 cm lesion adjacent to the gallbladder fossa.  There is a 3.3 cm hypodense lesion adjacent to the falciform ligament.  Scan was compared to prior scan from 10/21/2018. - She was thought to have progression in chemotherapy was switched to gemcitabine and Abraxane.  She received 2 doses on 03/14/2019 and 03/28/2019 in [place]. -I do not believe she had a true progression on FOLFIRINOX. - Cycle 1 of gemcitabine and Abraxane on 05/17/2019.  Cycle 2-day 1 on 06/14/2019. -She received day 15 of cycle 2 on 06/28/2019.  She missed day 8 of treatment. - She denied any major chemotherapy related problems. -We reviewed her labs today.  She will proceed with cycle 3-day 1 today.  She will come back in 1 week for follow-up and date of treatment.  She will continue 3 weeks on 1 week off for this cycle.  I plan to repeat CT scans after completion of cycle 3.  If there is very good response, we will consider illuminating day 8 of treatment.  2.  Abdominal pain: -She will continue oxycodone 10 mg every 6 hours as needed.  3.  Constipation: - She ran out of Linzess as she doubled up on the dose. -I will increase Linzess to 145 mcg daily.  I have also given prescription for lactulose.

## 2019-07-12 NOTE — Progress Notes (Signed)
Pt presents today for treatment and f/u appointment with Dr. Delton Coombes. VS within parameters for treatment. MAR reviewed and updated. Pt requests a refill on medication for constipation. Upon assessment of MAR no medication for constipation has been ordered. Pt teaching performed. Understanding verbalized.   Message received from [facility] LPN via text to proceed with treatment.  Treatment given today per MD orders. Tolerated infusion without adverse affects. Vital signs stable. No complaints at this time. Discharged from clinic ambulatory. F/U with [facility] as scheduled.

## 2019-07-13 LAB — CANCER ANTIGEN 19-9: CA 19-9: <2 U/mL (ref 0–35)

## 2019-07-18 ENCOUNTER — Other Ambulatory Visit: Payer: Self-pay

## 2019-07-19 ENCOUNTER — Inpatient Hospital Stay (HOSPITAL_COMMUNITY): Payer: Medicare HMO

## 2019-07-19 ENCOUNTER — Inpatient Hospital Stay (HOSPITAL_BASED_OUTPATIENT_CLINIC_OR_DEPARTMENT_OTHER): Payer: Medicare HMO | Admitting: Nurse Practitioner

## 2019-07-19 VITALS — BP 143/74 | HR 71 | Temp 97.9°F | Resp 18 | Wt 233.6 lb

## 2019-07-19 DIAGNOSIS — C259 Malignant neoplasm of pancreas, unspecified: Secondary | ICD-10-CM

## 2019-07-19 DIAGNOSIS — C787 Secondary malignant neoplasm of liver and intrahepatic bile duct: Secondary | ICD-10-CM

## 2019-07-19 DIAGNOSIS — Z95828 Presence of other vascular implants and grafts: Secondary | ICD-10-CM

## 2019-07-19 DIAGNOSIS — Z5111 Encounter for antineoplastic chemotherapy: Secondary | ICD-10-CM | POA: Diagnosis not present

## 2019-07-19 LAB — CBC WITH DIFFERENTIAL/PLATELET
Abs Immature Granulocytes: 0.07 10*3/uL (ref 0.00–0.07)
Basophils Absolute: 0 10*3/uL (ref 0.0–0.1)
Basophils Relative: 1 %
Eosinophils Absolute: 0.1 10*3/uL (ref 0.0–0.5)
Eosinophils Relative: 1 %
HCT: 36.8 % (ref 36.0–46.0)
Hemoglobin: 11 g/dL — ABNORMAL LOW (ref 12.0–15.0)
Immature Granulocytes: 2 %
Lymphocytes Relative: 58 %
Lymphs Abs: 2.5 10*3/uL (ref 0.7–4.0)
MCH: 25.7 pg — ABNORMAL LOW (ref 26.0–34.0)
MCHC: 29.9 g/dL — ABNORMAL LOW (ref 30.0–36.0)
MCV: 86 fL (ref 80.0–100.0)
Monocytes Absolute: 0.3 10*3/uL (ref 0.1–1.0)
Monocytes Relative: 8 %
Neutro Abs: 1.3 10*3/uL — ABNORMAL LOW (ref 1.7–7.7)
Neutrophils Relative %: 30 %
Platelets: 239 10*3/uL (ref 150–400)
RBC: 4.28 MIL/uL (ref 3.87–5.11)
RDW: 19.1 % — ABNORMAL HIGH (ref 11.5–15.5)
WBC: 4.2 10*3/uL (ref 4.0–10.5)
nRBC: 0 % (ref 0.0–0.2)

## 2019-07-19 LAB — COMPREHENSIVE METABOLIC PANEL
ALT: 34 U/L (ref 0–44)
AST: 30 U/L (ref 15–41)
Albumin: 3.9 g/dL (ref 3.5–5.0)
Alkaline Phosphatase: 65 U/L (ref 38–126)
Anion gap: 9 (ref 5–15)
BUN: 10 mg/dL (ref 6–20)
CO2: 26 mmol/L (ref 22–32)
Calcium: 9 mg/dL (ref 8.9–10.3)
Chloride: 102 mmol/L (ref 98–111)
Creatinine, Ser: 0.68 mg/dL (ref 0.44–1.00)
GFR calc Af Amer: >60 mL/min (ref >60)
GFR calc non Af Amer: >60 mL/min (ref >60)
Glucose, Bld: 202 mg/dL — ABNORMAL HIGH (ref 70–99)
Potassium: 3.8 mmol/L (ref 3.5–5.1)
Sodium: 137 mmol/L (ref 135–145)
Total Bilirubin: <0.1 mg/dL — ABNORMAL LOW (ref 0.3–1.2)
Total Protein: 6.9 g/dL (ref 6.5–8.1)

## 2019-07-19 MED ORDER — SODIUM CHLORIDE 0.9 % IV SOLN
Freq: Once | INTRAVENOUS | Status: AC
  Administered 2019-07-19: 11:00:00 via INTRAVENOUS

## 2019-07-19 MED ORDER — SODIUM CHLORIDE 0.9 % IV SOLN
800.0000 mg/m2 | Freq: Once | INTRAVENOUS | Status: AC
  Administered 2019-07-19: 13:00:00 1786 mg via INTRAVENOUS
  Filled 2019-07-19: qty 26.3

## 2019-07-19 MED ORDER — SODIUM CHLORIDE 0.9% FLUSH
10.0000 mL | INTRAVENOUS | Status: DC | PRN
  Administered 2019-07-19 (×2): 10 mL
  Filled 2019-07-19 (×2): qty 10

## 2019-07-19 MED ORDER — HEPARIN SOD (PORK) LOCK FLUSH 100 UNIT/ML IV SOLN
500.0000 [IU] | Freq: Once | INTRAVENOUS | Status: AC | PRN
  Administered 2019-07-19: 500 [IU]

## 2019-07-19 MED ORDER — PACLITAXEL PROTEIN-BOUND CHEMO INJECTION 100 MG
75.0000 mg/m2 | Freq: Once | INTRAVENOUS | Status: AC
  Administered 2019-07-19: 12:00:00 175 mg via INTRAVENOUS
  Filled 2019-07-19: qty 35

## 2019-07-19 MED ORDER — SODIUM CHLORIDE 0.9 % IV SOLN
Freq: Once | INTRAVENOUS | Status: AC
  Administered 2019-07-19: 11:00:00 via INTRAVENOUS
  Filled 2019-07-19: qty 4

## 2019-07-19 NOTE — Progress Notes (Signed)
ANC 1.3 today with pharmacy and nurse practitioner notified.  Patients treatment Abraxane to be dose reduced verbal order Harriet Pho, NP.  Pharmacy made aware.  Patient complains of neuropathy and rates 10 on scale.  Patient to be given Gabapentin 300mg  three times a day by Harriet Pho, NP.   Ok to treat patient today verbal order Harriet Pho, NP with lab review.   Patient tolerated chemotherapy with no complaints voiced.  Port site clean and dry with no bruising or swelling noted at site.  Good blood return noted before and after administration of chemotherapy.  Band aid applied.  Patient left ambulatory with VSS and no s/s of distress noted.

## 2019-07-19 NOTE — Progress Notes (Signed)
Doris [name], [place] [id number]   CLINIC:  Medical Oncology/Hematology  PCP:  Patient, No Pcp Per No address on file None   REASON FOR VISIT:  Follow-up for Pancreatic Cancer   CURRENT THERAPY: Gemcitabine Abraxane  BRIEF ONCOLOGIC HISTORY:  Oncology History  Pancreatic cancer metastasized to liver ([place])  05/09/2019 Initial Diagnosis   Pancreatic cancer metastasized to liver ([place])   05/17/2019 -  Chemotherapy   The patient had PACLitaxel-protein bound (ABRAXANE) chemo infusion 275 mg, 125 mg/m2 = 275 mg, Intravenous,  Once, 3 of 4 cycles Dose modification: 100 mg/m2 (80 % of original dose 125 mg/m2, Cycle 1, Reason: Other (see comments), Comment: neutropenia), 93.75 mg/m2 (75 % of original dose 125 mg/m2, Cycle 1, Reason: Other (see comments), Comment: neutropenia), 100 mg/m2 (80 % of original dose 125 mg/m2, Cycle 2, Reason: Other (see comments), Comment: cytopenias with previous treatment), 75 mg/m2 (original dose 125 mg/m2, Cycle 3, Reason: Provider Judgment, Comment: neuropathy stated as 10/10 dose reduce required per NP Reynolds Bowl) Administration: 275 mg (05/17/2019), 225 mg (05/24/2019), 200 mg (05/31/2019), 225 mg (06/14/2019), 225 mg (06/28/2019), 225 mg (07/12/2019), 175 mg (07/19/2019) ondansetron (ZOFRAN) 8 mg in sodium chloride 0.9 % 50 mL IVPB, 8 mg (100 % of original dose 8 mg), Intravenous,  Once, 1 of 1 cycle Dose modification: 8 mg (original dose 8 mg, Cycle 1) gemcitabine (GEMZAR) 2,200 mg in sodium chloride 0.9 % 250 mL chemo infusion, 2,204 mg, Intravenous,  Once, 3 of 4 cycles Dose modification: 750 mg/m2 (75 % of original dose 1,000 mg/m2, Cycle 1, Reason: Other (see comments), Comment: neutropenia), 750 mg/m2 (75 % of original dose 1,000 mg/m2, Cycle 1, Reason: Other (see comments), Comment: neutropenia), 800 mg/m2 (80 % of original dose 1,000 mg/m2, Cycle 2, Reason: Other (see comments), Comment: cytopenia) Administration: 2,200 mg  (05/17/2019), 1,600 mg (05/24/2019), 1,600 mg (05/31/2019), 1,786 mg (06/14/2019), 1,786 mg (06/28/2019), 1,786 mg (07/12/2019), 1,786 mg (07/19/2019) ondansetron (ZOFRAN) 8 mg, dexamethasone (DECADRON) 10 mg in sodium chloride 0.9 % 50 mL IVPB, , Intravenous,  Once, 3 of 4 cycles Administration:  (05/17/2019),  (05/24/2019),  (05/31/2019),  (06/14/2019),  (06/28/2019),  (07/12/2019),  (07/19/2019)  for chemotherapy treatment.         INTERVAL HISTORY:  Doris [name] 58 y.o. female presents today for follow-up.  She reports overall doing well.  She denies any significant fatigue.  States she is tolerating treatment fairly well.  She does report bilateral lower extremity neuropathy that started after her last cycle.  She states the neuropathy wakes her up at night.  She denies any nausea, vomiting, diarrhea.  Reports appetite is stable.  Denies any chest pain, shortness of breath lightheadedness or dizziness.  Denies any fevers, chills, night sweats.  She states she is ready to proceed with treatment today.  REVIEW OF SYSTEMS:  Review of Systems  Constitutional: Positive for fatigue.  HENT:  Negative.   Eyes: Negative.   Respiratory: Negative.   Cardiovascular: Negative.   Gastrointestinal: Negative.   Endocrine: Negative.   Genitourinary: Negative.    Musculoskeletal: Negative.   Skin: Negative.   Neurological: Positive for numbness.  Hematological: Negative.   Psychiatric/Behavioral: Negative.      PAST MEDICAL/SURGICAL HISTORY:  Past Medical History:  Diagnosis Date  . Diabetes mellitus without complication ([place])   . Hypertension   . Pancreatic cancer ([place])   . Port-A-Cath in place 05/16/2019   Past Surgical History:  Procedure Laterality Date  .  ABDOMINAL HYSTERECTOMY    . BACK SURGERY    . KIDNEY SURGERY     per pt, had leakage  . tubal ligation Left      SOCIAL HISTORY:  Social History   Socioeconomic History  . Marital status: Divorced    Spouse name: Not on file  .  Number of children: 1  . Years of education: Not on file  . Highest education level: Not on file  Occupational History  . Occupation: disabled  Social Needs  . Financial resource strain: Not hard at all  . Food insecurity    Worry: Never true    Inability: Never true  . Transportation needs    Medical: Yes    Non-medical: Yes  Tobacco Use  . Smoking status: Current Every Day Smoker    Packs/day: 1.00    Years: 45.00    Pack years: 45.00    Types: Cigarettes  . Smokeless tobacco: Never Used  Substance and Sexual Activity  . Alcohol use: Not Currently  . Drug use: Never  . Sexual activity: Not on file  Lifestyle  . Physical activity    Days per week: 0 days    Minutes per session: 0 min  . Stress: Not at all  Relationships  . Social connections    Talks on phone: More than three times a week    Gets together: More than three times a week    Attends religious service: 1 to 4 times per year    Active member of club or organization: No    Attends meetings of clubs or organizations: Never    Relationship status: Divorced  . Intimate partner violence    Fear of current or ex partner: No    Emotionally abused: No    Physically abused: No    Forced sexual activity: No  Other Topics Concern  . Not on file  Social History Narrative  . Not on file    FAMILY HISTORY:  Family History  Problem Relation Age of Onset  . Diabetes Mother   . Hypertension Mother   . Diabetes Father   . Hypertension Father   . Heart disease Brother     CURRENT MEDICATIONS:  Outpatient Encounter Medications as of 07/19/2019  Medication Sig  . amLODipine (NORVASC) 5 MG tablet Take 1 tablet (5 mg total) by mouth 2 (two) times daily.  . Gemcitabine HCl (GEMZAR IV) Inject into the vein. Days 1, 8, 15 q 28 days  . insulin aspart (NOVOLOG) 100 UNIT/ML injection Inject 20 Units into the skin 3 (three) times daily before meals.  Marland Kitchen linaclotide (LINZESS) 145 MCG CAPS capsule Take 1 capsule (145 mcg  total) by mouth daily before breakfast.  . lisinopril (ZESTRIL) 40 MG tablet Take 1 tablet (40 mg total) by mouth daily.  . ondansetron (ZOFRAN) 4 MG tablet Take 1 tablet (4 mg total) by mouth 3 (three) times daily.  . Oxycodone HCl 10 MG TABS Take 1 tablet (10 mg total) by mouth every 6 (six) hours as needed.  Marland Kitchen PACLitaxel Protein-Bound Part (ABRAXANE IV) Inject into the vein. Days 1, 8, 15 q 28 days  . Lactulose 20 GM/30ML SOLN Take 30 mLs (20 g total) by mouth at bedtime as needed. (Patient not taking: Reported on 07/19/2019)  . lidocaine-prilocaine (EMLA) cream Apply small amount to port a cath site and cover with plastic wrap 1 hour prior to chemotherapy appointments (Patient not taking: Reported on 07/12/2019)  . prochlorperazine (COMPAZINE) 10 MG tablet  Take 1 tablet (10 mg total) by mouth every 6 (six) hours as needed (Nausea or vomiting). (Patient not taking: Reported on 07/12/2019)   No facility-administered encounter medications on file as of 07/19/2019.     ALLERGIES:  Allergies  Allergen Reactions  . Hydrocodone Hives  . Tramadol Itching     PHYSICAL EXAM:  ECOG Performance status: 1  There were no vitals filed for this visit. There were no vitals filed for this visit.  Physical Exam Constitutional:      Appearance: Normal appearance. She is obese.  HENT:     Head: Normocephalic.     Right Ear: External ear normal.     Left Ear: External ear normal.     Nose: Nose normal.     Mouth/Throat:     Pharynx: Oropharynx is clear.  Eyes:     Conjunctiva/sclera: Conjunctivae normal.  Neck:     Musculoskeletal: Normal range of motion.  Cardiovascular:     Rate and Rhythm: Normal rate and regular rhythm.     Pulses: Normal pulses.     Heart sounds: Normal heart sounds.  Pulmonary:     Effort: Pulmonary effort is normal.     Breath sounds: Normal breath sounds.  Abdominal:     General: Bowel sounds are normal.  Musculoskeletal: Normal range of motion.  Skin:     General: Skin is warm.  Neurological:     General: No focal deficit present.     Mental Status: She is alert and oriented to person, place, and time.  Psychiatric:        Mood and Affect: Mood normal.        Behavior: Behavior normal.        Thought Content: Thought content normal.        Judgment: Judgment normal.      LABORATORY DATA:  I have reviewed the labs as listed.  CBC    Component Value Date/Time   WBC 4.2 07/19/2019 0921   RBC 4.28 07/19/2019 0921   HGB 11.0 (L) 07/19/2019 0921   HCT 36.8 07/19/2019 0921   PLT 239 07/19/2019 0921   MCV 86.0 07/19/2019 0921   MCH 25.7 (L) 07/19/2019 0921   MCHC 29.9 (L) 07/19/2019 0921   RDW 19.1 (H) 07/19/2019 0921   LYMPHSABS 2.5 07/19/2019 0921   MONOABS 0.3 07/19/2019 0921   EOSABS 0.1 07/19/2019 0921   BASOSABS 0.0 07/19/2019 0921   CMP Latest Ref Rng & Units 07/19/2019 07/12/2019 06/28/2019  Glucose 70 - 99 mg/dL 202(H) 231(H) 211(H)  BUN 6 - 20 mg/dL _0 Creatinine 0.44 - 1.00 mg/dL 0.68 0.77 0.68  Sodium 135 - 145 mmol/L 137 137 137  Potassium 3.5 - 5.1 mmol/L 3.8 3.8 3.6  Chloride 98 - 111 mmol/L 102 97(L) 99  CO2 22 - 32 mmol/L _1 Calcium 8.9 - 10.3 mg/dL 9.0 9.0 8.8(L)  Total Protein 6.5 - 8.1 g/dL 6.9 6.4(L) 6.4(L)  Total Bilirubin 0.3 - 1.2 mg/dL <0.1(L) 0.1(L) 0.3  Alkaline Phos 38 - 126 U/L 65 71 77  AST 15 - 41 U/L _2 ALT 0 - 44 U/L 34 21 17       ASSESSMENT & PLAN:   Pancreatic cancer metastasized to liver ([facility]) 1.  Metastatic pancreatic cancer to the liver: -Biopsy of the pancreatic tail mass on 01/05/2019, moderately to poorly differentiated adenocarcinoma. -Foundation 1 testing shows MS-stable, K-ras G 12 V, CDK 6 amplification, ARID1A - 3  cycles of FOLFIRINOX from 02/25/2019 through 03/28/2019. -CTAP on 04/04/2019 shows pancreatic mass measuring 3.3 x 4.7 x 2.9 cm, hypodense lesions in the liver, largest 3.2 cm in the right hepatic lobe and 1.4 cm lesion adjacent to the gallbladder  fossa.  There is a 3.3 cm hypodense lesion adjacent to the falciform ligament.  Scan was compared to prior scan from 10/21/2018. - She was thought to have progression in chemotherapy was switched to gemcitabine and Abraxane.  She received 2 doses on 03/14/2019 and 03/28/2019 in [place]. -I do not believe she had a true progression on FOLFIRINOX. - Cycle 1 of gemcitabine and Abraxane on 05/17/2019.  Cycle 2-day 1 on 06/14/2019. -She received day 15 of cycle 2 on 06/28/2019.  She missed day 8 of treatment. -Labs are stable to proceed with cycle 3-day 8.  We will reduce Abraxane 25% due to grade 2/3 peripheral neuropathy. -Will plan to repeat CT scans after completion of cycle 3.  If there is very good response, we will consider illuminating day 8 of treatment.  2.  Abdominal pain: -She will continue oxycodone 10 mg every 6 hours as needed.  3.  Constipation: - She ran out of Linzess as she doubled up on the dose. -I will increase Linzess to 145 mcg daily.  I have also given prescription for lactulose.  4.  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  We discussed the side effects at length.      Orders placed this encounter:  Orders Placed This Encounter  Procedures  . CBC with Differential  . Comprehensive metabolic panel     [place] [phone]

## 2019-07-19 NOTE — Assessment & Plan Note (Signed)
1.  Metastatic pancreatic cancer to the liver: -Biopsy of the pancreatic tail mass on 01/05/2019, moderately to poorly differentiated adenocarcinoma. -Foundation 1 testing shows MS-stable, K-ras G 12 V, CDK 6 amplification, ARID1A - 3 cycles of FOLFIRINOX from 02/25/2019 through 03/28/2019. -CTAP on 04/04/2019 shows pancreatic mass measuring 3.3 x 4.7 x 2.9 cm, hypodense lesions in the liver, largest 3.2 cm in the right hepatic lobe and 1.4 cm lesion adjacent to the gallbladder fossa.  There is a 3.3 cm hypodense lesion adjacent to the falciform ligament.  Scan was compared to prior scan from 10/21/2018. - She was thought to have progression in chemotherapy was switched to gemcitabine and Abraxane.  She received 2 doses on 03/14/2019 and 03/28/2019 in [place]. -I do not believe she had a true progression on FOLFIRINOX. - Cycle 1 of gemcitabine and Abraxane on 05/17/2019.  Cycle 2-day 1 on 06/14/2019. -She received day 15 of cycle 2 on 06/28/2019.  She missed day 8 of treatment. -Labs are stable to proceed with cycle 3-day 8.  We will reduce Abraxane 25% due to grade 2/3 peripheral neuropathy. -Will plan to repeat CT scans after completion of cycle 3.  If there is very good response, we will consider illuminating day 8 of treatment.  2.  Abdominal pain: -She will continue oxycodone 10 mg every 6 hours as needed.  3.  Constipation: - She ran out of Linzess as she doubled up on the dose. -I will increase Linzess to 145 mcg daily.  I have also given prescription for lactulose.  4.  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  We discussed the side effects at length.

## 2019-07-26 ENCOUNTER — Other Ambulatory Visit (HOSPITAL_COMMUNITY): Payer: Medicare HMO

## 2019-07-26 ENCOUNTER — Ambulatory Visit (HOSPITAL_COMMUNITY): Payer: Medicare HMO | Admitting: Hematology

## 2019-07-26 ENCOUNTER — Ambulatory Visit (HOSPITAL_COMMUNITY): Payer: Medicare HMO

## 2019-08-11 ENCOUNTER — Other Ambulatory Visit: Payer: Self-pay

## 2019-08-11 ENCOUNTER — Inpatient Hospital Stay (HOSPITAL_COMMUNITY): Payer: Medicare HMO | Attending: Genetic Counselor | Admitting: Genetic Counselor

## 2019-08-11 ENCOUNTER — Encounter (HOSPITAL_COMMUNITY): Payer: Self-pay | Admitting: Genetic Counselor

## 2019-08-11 ENCOUNTER — Inpatient Hospital Stay (HOSPITAL_COMMUNITY): Payer: Medicare HMO

## 2019-08-11 DIAGNOSIS — Z794 Long term (current) use of insulin: Secondary | ICD-10-CM | POA: Insufficient documentation

## 2019-08-11 DIAGNOSIS — C259 Malignant neoplasm of pancreas, unspecified: Secondary | ICD-10-CM

## 2019-08-11 DIAGNOSIS — C787 Secondary malignant neoplasm of liver and intrahepatic bile duct: Secondary | ICD-10-CM | POA: Insufficient documentation

## 2019-08-11 DIAGNOSIS — K5900 Constipation, unspecified: Secondary | ICD-10-CM | POA: Insufficient documentation

## 2019-08-11 DIAGNOSIS — F17210 Nicotine dependence, cigarettes, uncomplicated: Secondary | ICD-10-CM | POA: Insufficient documentation

## 2019-08-11 DIAGNOSIS — Z833 Family history of diabetes mellitus: Secondary | ICD-10-CM | POA: Insufficient documentation

## 2019-08-11 DIAGNOSIS — Z800 Family history of malignant neoplasm of digestive organs: Secondary | ICD-10-CM

## 2019-08-11 DIAGNOSIS — Z8042 Family history of malignant neoplasm of prostate: Secondary | ICD-10-CM | POA: Diagnosis not present

## 2019-08-11 DIAGNOSIS — Z5111 Encounter for antineoplastic chemotherapy: Secondary | ICD-10-CM | POA: Insufficient documentation

## 2019-08-11 DIAGNOSIS — C252 Malignant neoplasm of tail of pancreas: Secondary | ICD-10-CM | POA: Insufficient documentation

## 2019-08-11 DIAGNOSIS — I10 Essential (primary) hypertension: Secondary | ICD-10-CM | POA: Insufficient documentation

## 2019-08-11 DIAGNOSIS — Z8249 Family history of ischemic heart disease and other diseases of the circulatory system: Secondary | ICD-10-CM | POA: Insufficient documentation

## 2019-08-11 DIAGNOSIS — Z79899 Other long term (current) drug therapy: Secondary | ICD-10-CM | POA: Insufficient documentation

## 2019-08-11 DIAGNOSIS — G893 Neoplasm related pain (acute) (chronic): Secondary | ICD-10-CM | POA: Insufficient documentation

## 2019-08-11 DIAGNOSIS — G629 Polyneuropathy, unspecified: Secondary | ICD-10-CM | POA: Insufficient documentation

## 2019-08-11 DIAGNOSIS — E119 Type 2 diabetes mellitus without complications: Secondary | ICD-10-CM | POA: Insufficient documentation

## 2019-08-11 DIAGNOSIS — Z801 Family history of malignant neoplasm of trachea, bronchus and lung: Secondary | ICD-10-CM | POA: Insufficient documentation

## 2019-08-11 NOTE — Progress Notes (Signed)
REFERRING PROVIDER: Derek Jack, MD [address] [place],  [place] [address]  PRIMARY PROVIDER:  Patient, No Pcp Per  PRIMARY REASON FOR VISIT:  1. Family history of stomach cancer   2. Family history of prostate cancer      HISTORY OF PRESENT ILLNESS:   I connected with  Ms. Zendejas on 08/11/2019 at 9 AM EDT by Webex video conference and verified that I am speaking with the correct person using two identifiers.   Patient location: [facility] Provider location: [facility]   Ms. Prien, a 58 y.o. female, was seen for a [facility] cancer genetics consultation at the request of Dr. Delton Coombes due to a personal and family history of cancer.  Ms. Oshields presents to clinic today to discuss the possibility of a hereditary predisposition to cancer, genetic testing, and to further clarify her future cancer risks, as well as potential cancer risks for family members.   In January, at the age of 59, Ms. Hosie was diagnosed with cancer of the pancreas. The treatment plan includes chemotherapy.  She is being seen for genetic testing to determine the feasibility of PARP inhibitors.      CANCER HISTORY:  Oncology History  Pancreatic cancer metastasized to liver ([place])  05/09/2019 Initial Diagnosis   Pancreatic cancer metastasized to liver ([place])   05/17/2019 -  Chemotherapy   The patient had PACLitaxel-protein bound (ABRAXANE) chemo infusion 275 mg, 125 mg/m2 = 275 mg, Intravenous,  Once, 3 of 4 cycles Dose modification: 100 mg/m2 (80 % of original dose 125 mg/m2, Cycle 1, Reason: Other (see comments), Comment: neutropenia), 93.75 mg/m2 (75 % of original dose 125 mg/m2, Cycle 1, Reason: Other (see comments), Comment: neutropenia), 100 mg/m2 (80 % of original dose 125 mg/m2, Cycle 2, Reason: Other (see comments), Comment: cytopenias with previous treatment), 75 mg/m2 (original dose 125 mg/m2, Cycle 3, Reason: Provider Judgment, Comment: neuropathy stated as 10/10 dose reduce required per NP Reynolds Bowl) Administration: 275 mg (05/17/2019), 225 mg (05/24/2019), 200 mg (05/31/2019), 225 mg (06/14/2019), 225 mg (06/28/2019), 225 mg (07/12/2019), 175 mg (07/19/2019) ondansetron (ZOFRAN) 8 mg in sodium chloride 0.9 % 50 mL IVPB, 8 mg (100 % of original dose 8 mg), Intravenous,  Once, 1 of 1 cycle Dose modification: 8 mg (original dose 8 mg, Cycle 1) gemcitabine (GEMZAR) 2,200 mg in sodium chloride 0.9 % 250 mL chemo infusion, 2,204 mg, Intravenous,  Once, 3 of 4 cycles Dose modification: 750 mg/m2 (75 % of original dose 1,000 mg/m2, Cycle 1, Reason: Other (see comments), Comment: neutropenia), 750 mg/m2 (75 % of original dose 1,000 mg/m2, Cycle 1, Reason: Other (see comments), Comment: neutropenia), 800 mg/m2 (80 % of original dose 1,000 mg/m2, Cycle 2, Reason: Other (see comments), Comment: cytopenia) Administration: 2,200 mg (05/17/2019), 1,600 mg (05/24/2019), 1,600 mg (05/31/2019), 1,786 mg (06/14/2019), 1,786 mg (06/28/2019), 1,786 mg (07/12/2019), 1,786 mg (07/19/2019) ondansetron (ZOFRAN) 8 mg, dexamethasone (DECADRON) 10 mg in sodium chloride 0.9 % 50 mL IVPB, , Intravenous,  Once, 3 of 4 cycles Administration:  (05/17/2019),  (05/24/2019),  (05/31/2019),  (06/14/2019),  (06/28/2019),  (07/12/2019),  (07/19/2019)  for chemotherapy treatment.       RISK FACTORS:  Menarche was at age 46.  First live birth at age 59.  Ovaries intact: no.  Hysterectomy: yes.  Menopausal status: postmenopausal.  HRT use: 1 years. Colonoscopy: yes; polyposis - 20+ polyps. Mammogram within the last year: no. Number of breast biopsies: 0. Up to date with pelvic exams: yes. Any excessive radiation  exposure in the past: no  Past Medical History:  Diagnosis Date   Diabetes mellitus without complication ([facility])    Family history of prostate cancer    Family history of stomach cancer    Hypertension    Pancreatic cancer ([place])    Port-A-Cath in place 05/16/2019    Past Surgical History:  Procedure Laterality  Date   ABDOMINAL HYSTERECTOMY     BACK SURGERY     KIDNEY SURGERY     per pt, had leakage   tubal ligation Left     Social History   Socioeconomic History   Marital status: Divorced    Spouse name: Not on file   Number of children: 1   Years of education: Not on file   Highest education level: Not on file  Occupational History   Occupation: disabled  Social Designer, fashion/clothing strain: Not hard at all   Food insecurity    Worry: Never true    Inability: Never true   Transportation needs    Medical: Yes    Non-medical: Yes  Tobacco Use   Smoking status: Current Every Day Smoker    Packs/day: 1.00    Years: 45.00    Pack years: 45.00    Types: Cigarettes   Smokeless tobacco: Never Used  Substance and Sexual Activity   Alcohol use: Not Currently   Drug use: Never   Sexual activity: Not on file  Lifestyle   Physical activity    Days per week: 0 days    Minutes per session: 0 min   Stress: Not at all  Relationships   Social connections    Talks on phone: More than three times a week    Gets together: More than three times a week    Attends religious service: 1 to 4 times per year    Active member of club or organization: No    Attends meetings of clubs or organizations: Never    Relationship status: Divorced  Other Topics Concern   Not on file  Social History Narrative   Not on file     FAMILY HISTORY:  We obtained a detailed, 4-generation family history.  Significant diagnoses are listed below: Family History  Problem Relation Age of Onset   Diabetes Mother    Hypertension Mother    Lung cancer Mother 23       d. 49   Diabetes Father    Hypertension Father    Heart disease Brother    Stomach cancer Paternal Aunt 45   Stroke Paternal Grandmother    Prostate cancer Other        PGMs brother   Stomach cancer Other        PGMs mother    The patient has one daughter who is cancer free.  She has a full brother and  paternal half sister who are cancer free.  Her mother is deceased and her father is living.  The patient's father has three brothers and three sisters, one sister had stomach cancer at 35.  His parents are deceased from non cancer related issues. His mother's brother had prostate cancer and his mother's mother had stomach cancer.  The patient's mother had lung cancer at 4.  She has two brothers and three sisters who are reportedly cancer free.  Her parents are deceased from non-cancer related issues.  Ms. Swier is unaware of previous family history of genetic testing for hereditary cancer risks. Patient's maternal ancestors are of [place] American  descent, and paternal ancestors are of African American descent. There is no reported Ashkenazi Jewish ancestry. There is no known consanguinity.     GENETIC COUNSELING ASSESSMENT: Ms. Rasmussen is a 58 y.o. female with a personal and family history of cancer which is somewhat suggestive of a hereditary cancer panel and predisposition to cancer given her diagnosis of pancreatic cancer and polyposis. We, therefore, discussed and recommended the following at today's visit.   DISCUSSION: We discussed that 5 - 10% of pancreatic cancer is hereditary, with most cases associated with BRCA mutations.  There are other genes that can be associated with hereditary pancreatic cancer syndromes.  These include APC, PALB2, ATM and others.  We discussed that testing is beneficial for several reasons including knowing how to follow individuals after completing their treatment, identifying whether potential treatment options such as PARP inhibitors would be beneficial, and understand if other family members could be at risk for cancer and allow them to undergo genetic testing.   We reviewed the characteristics, features and inheritance patterns of hereditary cancer syndromes. We also discussed genetic testing, including the appropriate family members to test, the process of  testing, insurance coverage and turn-around-time for results. We discussed the implications of a negative, positive, carrier and/or variant of uncertain significant result. We recommended Ms. Sabra Heck pursue genetic testing for the common hereditary cancer gene panel. The Common Hereditary Gene Panel offered by [facility] includes sequencing and/or deletion duplication testing of the following 48 genes: APC, ATM, AXIN2, BARD1, BMPR1A, BRCA1, BRCA2, BRIP1, CDH1, CDK4, CDKN2A (p14ARF), CDKN2A (p16INK4a), CHEK2, CTNNA1, DICER1, EPCAM (Deletion/duplication testing only), GREM1 (promoter region deletion/duplication testing only), KIT, MEN1, MLH1, MSH2, MSH3, MSH6, MUTYH, NBN, NF1, NHTL1, PALB2, PDGFRA, PMS2, POLD1, POLE, PTEN, RAD50, RAD51C, RAD51D, RNF43, SDHB, SDHC, SDHD, SMAD4, SMARCA4. STK11, TP53, TSC1, TSC2, and VHL.  The following genes were evaluated for sequence changes only: SDHA and HOXB13 c.251G>A variant only.   Based on Ms. Nipper's personal and family history of cancer, she meets medical criteria for genetic testing. Despite that she meets criteria, she may still have an out of pocket cost. We discussed that if her out of pocket cost for testing is over $100, the laboratory will call and confirm whether she wants to proceed with testing.  If the out of pocket cost of testing is less than $100 she will be billed by the genetic testing laboratory.   PLAN: After considering the risks, benefits, and limitations, Ms. Coudriet provided informed consent to pursue genetic testing and the blood sample was sent to [facility] for analysis of the common hereditary cancer panel. Results should be available within approximately 2-3 weeks' time, at which point they will be disclosed by telephone to Ms. Sabra Heck, as will any additional recommendations warranted by these results. Ms. Vanderhoff will receive a summary of her genetic counseling visit and a copy of her results once available. This information will also be  available in Epic.   Lastly, we encouraged Ms. Hazelip to remain in contact with cancer genetics annually so that we can continuously update the family history and inform her of any changes in cancer genetics and testing that may be of benefit for this family.   Ms. Verhagen questions were answered to her satisfaction today. Our contact information was provided should additional questions or concerns arise. Thank you for the referral and allowing [place] to share in the care of your patient.   Brightyn Mozer P. [place]ne [place], [place], [facility] Licensed, Insurance risk surveyor [email or web address] phone: [phone]  The  patient was seen for a total of 45 minutes in face-to-face genetic counseling.  This patient was discussed with Drs. Magrinat, Lindi Adie and/or Burr Medico who agrees with the above.    _______________________________________________________________________ For Office Staff:  Number of people involved in session: 1 Was an Intern/ student involved with case: no

## 2019-08-16 ENCOUNTER — Inpatient Hospital Stay (HOSPITAL_COMMUNITY): Payer: Medicare HMO

## 2019-08-16 ENCOUNTER — Other Ambulatory Visit: Payer: Self-pay

## 2019-08-16 ENCOUNTER — Encounter (HOSPITAL_COMMUNITY): Payer: Self-pay | Admitting: Hematology

## 2019-08-16 ENCOUNTER — Inpatient Hospital Stay (HOSPITAL_BASED_OUTPATIENT_CLINIC_OR_DEPARTMENT_OTHER): Payer: Medicare HMO | Admitting: Hematology

## 2019-08-16 VITALS — BP 134/67 | HR 70 | Temp 97.1°F | Resp 18 | Wt 239.2 lb

## 2019-08-16 VITALS — BP 152/75 | HR 87 | Temp 97.1°F | Resp 18 | Wt 239.2 lb

## 2019-08-16 DIAGNOSIS — C259 Malignant neoplasm of pancreas, unspecified: Secondary | ICD-10-CM

## 2019-08-16 DIAGNOSIS — Z794 Long term (current) use of insulin: Secondary | ICD-10-CM | POA: Diagnosis not present

## 2019-08-16 DIAGNOSIS — C787 Secondary malignant neoplasm of liver and intrahepatic bile duct: Secondary | ICD-10-CM | POA: Diagnosis present

## 2019-08-16 DIAGNOSIS — Z8042 Family history of malignant neoplasm of prostate: Secondary | ICD-10-CM | POA: Diagnosis not present

## 2019-08-16 DIAGNOSIS — Z95828 Presence of other vascular implants and grafts: Secondary | ICD-10-CM

## 2019-08-16 DIAGNOSIS — E119 Type 2 diabetes mellitus without complications: Secondary | ICD-10-CM | POA: Diagnosis not present

## 2019-08-16 DIAGNOSIS — Z800 Family history of malignant neoplasm of digestive organs: Secondary | ICD-10-CM | POA: Diagnosis not present

## 2019-08-16 DIAGNOSIS — Z833 Family history of diabetes mellitus: Secondary | ICD-10-CM | POA: Diagnosis not present

## 2019-08-16 DIAGNOSIS — F17210 Nicotine dependence, cigarettes, uncomplicated: Secondary | ICD-10-CM | POA: Diagnosis not present

## 2019-08-16 DIAGNOSIS — Z801 Family history of malignant neoplasm of trachea, bronchus and lung: Secondary | ICD-10-CM | POA: Diagnosis not present

## 2019-08-16 DIAGNOSIS — Z5111 Encounter for antineoplastic chemotherapy: Secondary | ICD-10-CM | POA: Diagnosis present

## 2019-08-16 DIAGNOSIS — C252 Malignant neoplasm of tail of pancreas: Secondary | ICD-10-CM | POA: Diagnosis present

## 2019-08-16 DIAGNOSIS — G893 Neoplasm related pain (acute) (chronic): Secondary | ICD-10-CM | POA: Diagnosis not present

## 2019-08-16 DIAGNOSIS — G629 Polyneuropathy, unspecified: Secondary | ICD-10-CM | POA: Diagnosis not present

## 2019-08-16 DIAGNOSIS — Z8249 Family history of ischemic heart disease and other diseases of the circulatory system: Secondary | ICD-10-CM | POA: Diagnosis not present

## 2019-08-16 DIAGNOSIS — I10 Essential (primary) hypertension: Secondary | ICD-10-CM | POA: Diagnosis not present

## 2019-08-16 DIAGNOSIS — K5900 Constipation, unspecified: Secondary | ICD-10-CM | POA: Diagnosis not present

## 2019-08-16 DIAGNOSIS — Z79899 Other long term (current) drug therapy: Secondary | ICD-10-CM | POA: Diagnosis not present

## 2019-08-16 LAB — CBC WITH DIFFERENTIAL/PLATELET
Abs Immature Granulocytes: 0.02 10*3/uL (ref 0.00–0.07)
Basophils Absolute: 0.1 10*3/uL (ref 0.0–0.1)
Basophils Relative: 1 %
Eosinophils Absolute: 0.2 10*3/uL (ref 0.0–0.5)
Eosinophils Relative: 4 %
HCT: 41.8 % (ref 36.0–46.0)
Hemoglobin: 12.3 g/dL (ref 12.0–15.0)
Immature Granulocytes: 0 %
Lymphocytes Relative: 41 %
Lymphs Abs: 2.4 10*3/uL (ref 0.7–4.0)
MCH: 25.2 pg — ABNORMAL LOW (ref 26.0–34.0)
MCHC: 29.4 g/dL — ABNORMAL LOW (ref 30.0–36.0)
MCV: 85.7 fL (ref 80.0–100.0)
Monocytes Absolute: 0.7 10*3/uL (ref 0.1–1.0)
Monocytes Relative: 12 %
Neutro Abs: 2.5 10*3/uL (ref 1.7–7.7)
Neutrophils Relative %: 42 %
Platelets: 238 10*3/uL (ref 150–400)
RBC: 4.88 MIL/uL (ref 3.87–5.11)
RDW: 19.5 % — ABNORMAL HIGH (ref 11.5–15.5)
WBC: 5.9 10*3/uL (ref 4.0–10.5)
nRBC: 0 % (ref 0.0–0.2)

## 2019-08-16 LAB — COMPREHENSIVE METABOLIC PANEL
ALT: 16 U/L (ref 0–44)
AST: 16 U/L (ref 15–41)
Albumin: 3.9 g/dL (ref 3.5–5.0)
Alkaline Phosphatase: 76 U/L (ref 38–126)
Anion gap: 10 (ref 5–15)
BUN: 13 mg/dL (ref 6–20)
CO2: 26 mmol/L (ref 22–32)
Calcium: 9 mg/dL (ref 8.9–10.3)
Chloride: 102 mmol/L (ref 98–111)
Creatinine, Ser: 0.7 mg/dL (ref 0.44–1.00)
GFR calc Af Amer: >60 mL/min (ref >60)
GFR calc non Af Amer: >60 mL/min (ref >60)
Glucose, Bld: 199 mg/dL — ABNORMAL HIGH (ref 70–99)
Potassium: 3.9 mmol/L (ref 3.5–5.1)
Sodium: 138 mmol/L (ref 135–145)
Total Bilirubin: 0.5 mg/dL (ref 0.3–1.2)
Total Protein: 6.8 g/dL (ref 6.5–8.1)

## 2019-08-16 MED ORDER — SODIUM CHLORIDE 0.9 % IV SOLN
Freq: Once | INTRAVENOUS | Status: AC
  Administered 2019-08-16: 09:00:00 via INTRAVENOUS

## 2019-08-16 MED ORDER — HEPARIN SOD (PORK) LOCK FLUSH 100 UNIT/ML IV SOLN
500.0000 [IU] | Freq: Once | INTRAVENOUS | Status: AC | PRN
  Administered 2019-08-16: 500 [IU]

## 2019-08-16 MED ORDER — SODIUM CHLORIDE 0.9 % IV SOLN
Freq: Once | INTRAVENOUS | Status: AC
  Administered 2019-08-16: 09:00:00 via INTRAVENOUS
  Filled 2019-08-16: qty 4

## 2019-08-16 MED ORDER — SODIUM CHLORIDE 0.9% FLUSH
10.0000 mL | INTRAVENOUS | Status: DC | PRN
  Administered 2019-08-16: 10 mL
  Filled 2019-08-16: qty 10

## 2019-08-16 MED ORDER — PACLITAXEL PROTEIN-BOUND CHEMO INJECTION 100 MG
75.0000 mg/m2 | Freq: Once | INTRAVENOUS | Status: AC
  Administered 2019-08-16: 10:00:00 175 mg via INTRAVENOUS
  Filled 2019-08-16: qty 35

## 2019-08-16 MED ORDER — SODIUM CHLORIDE 0.9 % IV SOLN
800.0000 mg/m2 | Freq: Once | INTRAVENOUS | Status: AC
  Administered 2019-08-16: 1786 mg via INTRAVENOUS
  Filled 2019-08-16: qty 21.04

## 2019-08-16 MED ORDER — GABAPENTIN 300 MG PO CAPS: 300.0000 mg | ORAL_CAPSULE | Freq: Three times a day (TID) | ORAL | 0 refills | Status: DC

## 2019-08-16 NOTE — Patient Instructions (Addendum)
[place] at [facility] Discharge Instructions  You were seen today by Dr. Delton Coombes. He went over your recent lab results. He will see you back in 2 weeks for labs, treatment, scans and follow up.   Thank you for choosing [place] at [facility] to provide your oncology and hematology care.  To afford each patient quality time with our provider, please arrive at least 15 minutes before your scheduled appointment time.   If you have a lab appointment with the [facility] please come in thru the  Main Entrance and check in at the main information desk  You need to re-schedule your appointment should you arrive 10 or more minutes late.  We strive to give you quality time with our providers, and arriving late affects you and other patients whose appointments are after yours.  Also, if you no show three or more times for appointments you may be dismissed from the clinic at the providers discretion.     Again, thank you for choosing [facility].  Our hope is that these requests will decrease the amount of time that you wait before being seen by our physicians.       _____________________________________________________________  Should you have questions after your visit to [facility], please contact our office at [phone] between the hours of 8:00 a.m. and 4:30 p.m.  Voicemails left after 4:00 p.m. will not be returned until the following business day.  For prescription refill requests, have your pharmacy contact our office and allow 72 hours.    Cancer Center Support Programs:   > Cancer Support Group  2nd Tuesday of the month 1pm-2pm, Journey Room

## 2019-08-16 NOTE — Progress Notes (Signed)
Labs reviewed with MD. Proceed with treatment today per MD.  Treatment given per orders. Patient tolerated it well without problems. Vitals stable and discharged home from clinic ambulatory. Follow up as scheduled.

## 2019-08-16 NOTE — Patient Instructions (Signed)
[facility] Discharge Instructions for Patients Receiving Chemotherapy  Today you received the following chemotherapy agents   To help prevent nausea and vomiting after your treatment, we encourage you to take your nausea medication   If you develop nausea and vomiting that is not controlled by your nausea medication, call the clinic.   BELOW ARE SYMPTOMS THAT SHOULD BE REPORTED IMMEDIATELY:  *FEVER GREATER THAN 100.5 F  *CHILLS WITH OR WITHOUT FEVER  NAUSEA AND VOMITING THAT IS NOT CONTROLLED WITH YOUR NAUSEA MEDICATION  *UNUSUAL SHORTNESS OF BREATH  *UNUSUAL BRUISING OR BLEEDING  TENDERNESS IN MOUTH AND THROAT WITH OR WITHOUT PRESENCE OF ULCERS  *URINARY PROBLEMS  *BOWEL PROBLEMS  UNUSUAL RASH Items with * indicate a potential emergency and should be followed up as soon as possible.  Feel free to call the clinic should you have any questions or concerns. The clinic phone number is [phone].  Please show the CHEMO ALERT CARD at check-in to the Emergency Department and triage nurse.   

## 2019-08-16 NOTE — Assessment & Plan Note (Signed)
1.  Metastatic pancreatic cancer to the liver: -Biopsy of the pancreatic tail mass on 01/05/2019, moderately to poorly differentiated adenocarcinoma. -Foundation 1 testing shows MS-stable, K-ras G 12 V, CDK 6 amplification, ARID1A - 3 cycles of FOLFIRINOX from 02/25/2019 through 03/28/2019. -CTAP on 04/04/2019 shows pancreatic mass measuring 3.3 x 4.7 x 2.9 cm, hypodense lesions in the liver, largest 3.2 cm in the right hepatic lobe and 1.4 cm lesion adjacent to the gallbladder fossa.  There is a 3.3 cm hypodense lesion adjacent to the falciform ligament.  Scan was compared to prior scan from 10/21/2018. - She was thought to have progression in chemotherapy was switched to gemcitabine and Abraxane.  She received 2 doses on 03/14/2019 and 03/28/2019 in [place]. -I do not believe she had a true progression on FOLFIRINOX. -3 cycles of gemcitabine and Abraxane, day 1 and day 8 from 05/17/2019 through 07/12/2019. -She has missed follow-up appointments as she did not feel well.  Denied any abdominal pains. -Reportedly developed numbness in the feet after last treatment.  This is keeping her awake at nighttime. -Her cycle 3-day 8 Abraxane was decreased to 75 mg/m2. -I have reviewed her blood work today.  I will proceed with cycle 4-day 1 treatment today at the same dose level.  I have counseled her to maintain treatment appointments.  I will switch her treatment to day 1 and day 15 every 28 days. -I plan to repeat CT CAP prior to next visit in 2 weeks.  2.  Abdominal pain: -She will continue oxycodone 10 mg every 6 hours as needed.  3.  Constipation: -I have increased her Linzess to 145 mcg daily.  She is also using lactulose as needed.  4.  Peripheral neuropathy: -She developed numbness in the feet which is keeping her awake.  I will start her on gabapentin 300 mg 3 times a day.

## 2019-08-16 NOTE — Progress Notes (Signed)
[place] [place], [place] [address]   CLINIC:  Medical Oncology/Hematology  PCP:  Patient, No Pcp Per No address on file None   REASON FOR VISIT:  Follow-up for metastatic pancreatic cancer to the liver.   BRIEF ONCOLOGIC HISTORY:  Oncology History  Pancreatic cancer metastasized to liver ([place])  05/09/2019 Initial Diagnosis   Pancreatic cancer metastasized to liver ([place])   05/17/2019 -  Chemotherapy   The patient had PACLitaxel-protein bound (ABRAXANE) chemo infusion 275 mg, 125 mg/m2 = 275 mg, Intravenous,  Once, 4 of 5 cycles Dose modification: 100 mg/m2 (80 % of original dose 125 mg/m2, Cycle 1, Reason: Other (see comments), Comment: neutropenia), 93.75 mg/m2 (75 % of original dose 125 mg/m2, Cycle 1, Reason: Other (see comments), Comment: neutropenia), 100 mg/m2 (80 % of original dose 125 mg/m2, Cycle 2, Reason: Other (see comments), Comment: cytopenias with previous treatment), 75 mg/m2 (original dose 125 mg/m2, Cycle 3, Reason: Provider Judgment, Comment: neuropathy stated as 10/10 dose reduce required per NP Reynolds Bowl) Administration: 275 mg (05/17/2019), 225 mg (05/24/2019), 200 mg (05/31/2019), 225 mg (06/14/2019), 225 mg (06/28/2019), 225 mg (07/12/2019), 175 mg (07/19/2019), 175 mg (08/16/2019) ondansetron (ZOFRAN) 8 mg in sodium chloride 0.9 % 50 mL IVPB, 8 mg (100 % of original dose 8 mg), Intravenous,  Once, 1 of 1 cycle Dose modification: 8 mg (original dose 8 mg, Cycle 1) gemcitabine (GEMZAR) 2,200 mg in sodium chloride 0.9 % 250 mL chemo infusion, 2,204 mg, Intravenous,  Once, 4 of 5 cycles Dose modification: 750 mg/m2 (75 % of original dose 1,000 mg/m2, Cycle 1, Reason: Other (see comments), Comment: neutropenia), 750 mg/m2 (75 % of original dose 1,000 mg/m2, Cycle 1, Reason: Other (see comments), Comment: neutropenia), 800 mg/m2 (80 % of original dose 1,000 mg/m2, Cycle 2, Reason: Other (see comments), Comment: cytopenia) Administration: 2,200  mg (05/17/2019), 1,600 mg (05/24/2019), 1,600 mg (05/31/2019), 1,786 mg (06/14/2019), 1,786 mg (06/28/2019), 1,786 mg (07/12/2019), 1,786 mg (07/19/2019), 1,786 mg (08/16/2019) ondansetron (ZOFRAN) 8 mg, dexamethasone (DECADRON) 10 mg in sodium chloride 0.9 % 50 mL IVPB, , Intravenous,  Once, 4 of 5 cycles Administration:  (05/17/2019),  (05/24/2019),  (05/31/2019),  (06/14/2019),  (06/28/2019),  (07/12/2019),  (07/19/2019),  (08/16/2019)  for chemotherapy treatment.       CANCER STAGING: Cancer Staging No matching staging information was found for the patient.   INTERVAL HISTORY:  Doris [name] 58 y.o. female seen for follow-up of metastatic pancreatic cancer to the liver.  She did not show up for her last appointment.  She reported that she did not feel well.  Today she denies any abdominal pains.  No nausea, vomiting or diarrhea reported.  Appetite and energy levels are 100%.  She reported numbness in the feet which is constant and sometimes keeping her awake at nighttime.  She developed this at the start of cycle 3.  At last treatment, Abraxane was further dose reduced.  Numbness has remained the same.  Denies any numbness in the hands.  No new onset pains reported.  REVIEW OF SYSTEMS:  Review of Systems  Neurological: Positive for numbness.  All other systems reviewed and are negative.    PAST MEDICAL/SURGICAL HISTORY:  Past Medical History:  Diagnosis Date  . Diabetes mellitus without complication ([place])   . Family history of prostate cancer   . Family history of stomach cancer   . Hypertension   . Pancreatic cancer ([place])   . Port-A-Cath in place 05/16/2019   Past  Surgical History:  Procedure Laterality Date  . ABDOMINAL HYSTERECTOMY    . BACK SURGERY    . KIDNEY SURGERY     per pt, had leakage  . tubal ligation Left      SOCIAL HISTORY:  Social History   Socioeconomic History  . Marital status: Divorced    Spouse name: Not on file  . Number of children: 1  . Years of  education: Not on file  . Highest education level: Not on file  Occupational History  . Occupation: disabled  Social Needs  . Financial resource strain: Not hard at all  . Food insecurity    Worry: Never true    Inability: Never true  . Transportation needs    Medical: Yes    Non-medical: Yes  Tobacco Use  . Smoking status: Current Every Day Smoker    Packs/day: 1.00    Years: 45.00    Pack years: 45.00    Types: Cigarettes  . Smokeless tobacco: Never Used  Substance and Sexual Activity  . Alcohol use: Not Currently  . Drug use: Never  . Sexual activity: Not on file  Lifestyle  . Physical activity    Days per week: 0 days    Minutes per session: 0 min  . Stress: Not at all  Relationships  . Social connections    Talks on phone: More than three times a week    Gets together: More than three times a week    Attends religious service: 1 to 4 times per year    Active member of club or organization: No    Attends meetings of clubs or organizations: Never    Relationship status: Divorced  . Intimate partner violence    Fear of current or ex partner: No    Emotionally abused: No    Physically abused: No    Forced sexual activity: No  Other Topics Concern  . Not on file  Social History Narrative  . Not on file    FAMILY HISTORY:  Family History  Problem Relation Age of Onset  . Diabetes Mother   . Hypertension Mother   . Lung cancer Mother 64       d. 57  . Diabetes Father   . Hypertension Father   . Heart disease Brother   . Stomach cancer Paternal Aunt 26  . Stroke Paternal Grandmother   . Prostate cancer Other        PGMs brother  . Stomach cancer Other        PGMs mother    CURRENT MEDICATIONS:  Outpatient Encounter Medications as of 08/16/2019  Medication Sig  . amLODipine (NORVASC) 5 MG tablet Take 1 tablet (5 mg total) by mouth 2 (two) times daily.  . Gemcitabine HCl (GEMZAR IV) Inject into the vein. Days 1, 8, 15 q 28 days  . insulin aspart  (NOVOLOG) 100 UNIT/ML injection Inject 20 Units into the skin 3 (three) times daily before meals.  Marland Kitchen linaclotide (LINZESS) 145 MCG CAPS capsule Take 1 capsule (145 mcg total) by mouth daily before breakfast.  . lisinopril (ZESTRIL) 40 MG tablet Take 1 tablet (40 mg total) by mouth daily.  . Oxycodone HCl 10 MG TABS Take 1 tablet (10 mg total) by mouth every 6 (six) hours as needed.  Marland Kitchen PACLitaxel Protein-Bound Part (ABRAXANE IV) Inject into the vein. Days 1, 8, 15 q 28 days  . gabapentin (NEURONTIN) 300 MG capsule Take 1 capsule (300 mg total) by mouth 3 (three)  times daily.  . Lactulose 20 GM/30ML SOLN Take 30 mLs (20 g total) by mouth at bedtime as needed. (Patient not taking: Reported on 07/19/2019)  . lidocaine-prilocaine (EMLA) cream Apply small amount to port a cath site and cover with plastic wrap 1 hour prior to chemotherapy appointments (Patient not taking: Reported on 07/12/2019)  . ondansetron (ZOFRAN) 4 MG tablet Take 1 tablet (4 mg total) by mouth 3 (three) times daily. (Patient not taking: Reported on 08/16/2019)  . prochlorperazine (COMPAZINE) 10 MG tablet Take 1 tablet (10 mg total) by mouth every 6 (six) hours as needed (Nausea or vomiting). (Patient not taking: Reported on 07/12/2019)   No facility-administered encounter medications on file as of 08/16/2019.     ALLERGIES:  Allergies  Allergen Reactions  . Hydrocodone Hives  . Tramadol Itching     PHYSICAL EXAM:  ECOG Performance status: 1  Vitals:   08/16/19 0807  BP: (!) 152/75  Pulse: 87  Resp: 18  Temp: (!) 97.1 F (36.2 C)  SpO2: 98%   Filed Weights   08/16/19 0807  Weight: 239 lb 3.2 oz (108.5 kg)    Physical Exam Vitals signs reviewed.  Constitutional:      Appearance: Normal appearance.  Cardiovascular:     Rate and Rhythm: Normal rate and regular rhythm.     Heart sounds: Normal heart sounds.  Pulmonary:     Effort: Pulmonary effort is normal.     Breath sounds: Normal breath sounds.   Abdominal:     General: There is no distension.     Palpations: Abdomen is soft. There is no mass.  Musculoskeletal:        General: No swelling.  Lymphadenopathy:     Cervical: No cervical adenopathy.  Skin:    General: Skin is warm.  Neurological:     General: No focal deficit present.     Mental Status: She is alert and oriented to person, place, and time.  Psychiatric:        Mood and Affect: Mood normal.        Behavior: Behavior normal.      LABORATORY DATA:  I have reviewed the labs as listed.  CBC    Component Value Date/Time   WBC 5.9 08/16/2019 0754   RBC 4.88 08/16/2019 0754   HGB 12.3 08/16/2019 0754   HCT 41.8 08/16/2019 0754   PLT 238 08/16/2019 0754   MCV 85.7 08/16/2019 0754   MCH 25.2 (L) 08/16/2019 0754   MCHC 29.4 (L) 08/16/2019 0754   RDW 19.5 (H) 08/16/2019 0754   LYMPHSABS 2.4 08/16/2019 0754   MONOABS 0.7 08/16/2019 0754   EOSABS 0.2 08/16/2019 0754   BASOSABS 0.1 08/16/2019 0754   CMP Latest Ref Rng & Units 08/16/2019 07/19/2019 07/12/2019  Glucose 70 - 99 mg/dL 199(H) 202(H) 231(H)  BUN 6 - 20 mg/dL _0 Creatinine 0.44 - 1.00 mg/dL 0.70 0.68 0.77  Sodium 135 - 145 mmol/L 138 137 137  Potassium 3.5 - 5.1 mmol/L 3.9 3.8 3.8  Chloride 98 - 111 mmol/L 102 102 97(L)  CO2 22 - 32 mmol/L _1 Calcium 8.9 - 10.3 mg/dL 9.0 9.0 9.0  Total Protein 6.5 - 8.1 g/dL 6.8 6.9 6.4(L)  Total Bilirubin 0.3 - 1.2 mg/dL 0.5 <0.1(L) 0.1(L)  Alkaline Phos 38 - 126 U/L 76 65 71  AST 15 - 41 U/L _2 ALT 0 - 44 U/L 16 34 21  DIAGNOSTIC IMAGING:  I have independently reviewed the scans and discussed with the patient.   I have reviewed Doris Lick LPN's note and agree with the documentation.  I personally performed a face-to-face visit, made revisions and my assessment and plan is as follows.    ASSESSMENT & PLAN:   Pancreatic cancer metastasized to liver ([place]) 1.  Metastatic pancreatic cancer to the liver: -Biopsy of the  pancreatic tail mass on 01/05/2019, moderately to poorly differentiated adenocarcinoma. -Foundation 1 testing shows MS-stable, K-ras G 12 V, CDK 6 amplification, ARID1A - 3 cycles of FOLFIRINOX from 02/25/2019 through 03/28/2019. -CTAP on 04/04/2019 shows pancreatic mass measuring 3.3 x 4.7 x 2.9 cm, hypodense lesions in the liver, largest 3.2 cm in the right hepatic lobe and 1.4 cm lesion adjacent to the gallbladder fossa.  There is a 3.3 cm hypodense lesion adjacent to the falciform ligament.  Scan was compared to prior scan from 10/21/2018. - She was thought to have progression in chemotherapy was switched to gemcitabine and Abraxane.  She received 2 doses on 03/14/2019 and 03/28/2019 in [place]. -I do not believe she had a true progression on FOLFIRINOX. -3 cycles of gemcitabine and Abraxane, day 1 and day 8 from 05/17/2019 through 07/12/2019. -She has missed follow-up appointments as she did not feel well.  Denied any abdominal pains. -Reportedly developed numbness in the feet after last treatment.  This is keeping her awake at nighttime. -Her cycle 3-day 8 Abraxane was decreased to 75 mg/m2. -I have reviewed her blood work today.  I will proceed with cycle 4-day 1 treatment today at the same dose level.  I have counseled her to maintain treatment appointments.  I will switch her treatment to day 1 and day 15 every 28 days. -I plan to repeat CT CAP prior to next visit in 2 weeks.  2.  Abdominal pain: -She will continue oxycodone 10 mg every 6 hours as needed.  3.  Constipation: -I have increased her Linzess to 145 mcg daily.  She is also using lactulose as needed.  4.  Peripheral neuropathy: -She developed numbness in the feet which is keeping her awake.  I will start her on gabapentin 300 mg 3 times a day.  Total time spent is 25 minutes with more than 50% of the time spent face-to-face discussing treatment plan, counseling and coordination of care.    Orders placed this  encounter:  Orders Placed This Encounter  Procedures  . CT Chest W Contrast  . CT Abdomen Pelvis W Contrast  . CBC with Differential/Platelet  . Comprehensive metabolic panel      Derek Jack, MD [place] [phone]

## 2019-08-16 NOTE — Patient Instructions (Signed)
[facility] at [facility] Discharge Instructions  Labs drawn from portacath today   Thank you for choosing [facility] at [facility] to provide your oncology and hematology care.  To afford each patient quality time with our provider, please arrive at least 15 minutes before your scheduled appointment time.   If you have a lab appointment with the Cancer Center please come in thru the Main Entrance and check in at the main information desk.  You need to re-schedule your appointment should you arrive 10 or more minutes late.  We strive to give you quality time with our providers, and arriving late affects you and other patients whose appointments are after yours.  Also, if you no show three or more times for appointments you may be dismissed from the clinic at the providers discretion.     Again, thank you for choosing [facility].  Our hope is that these requests will decrease the amount of time that you wait before being seen by our physicians.       _____________________________________________________________  Should you have questions after your visit to [facility], please contact our office at [phone] between the hours of 8:00 a.m. and 4:30 p.m.  Voicemails left after 4:00 p.m. will not be returned until the following business day.  For prescription refill requests, have your pharmacy contact our office and allow 72 hours.    Due to Covid, you will need to wear a mask upon entering the hospital. If you do not have a mask, a mask will be given to you at the Main Entrance upon arrival. For doctor visits, patients may have 1 support person with them. For treatment visits, patients can not have anyone with them due to social distancing guidelines and our immunocompromised population.     

## 2019-08-22 ENCOUNTER — Ambulatory Visit (HOSPITAL_COMMUNITY): Payer: Medicare HMO

## 2019-08-23 ENCOUNTER — Other Ambulatory Visit (HOSPITAL_COMMUNITY): Payer: Medicare HMO

## 2019-08-23 ENCOUNTER — Ambulatory Visit (HOSPITAL_COMMUNITY): Payer: Medicare HMO

## 2019-08-29 ENCOUNTER — Other Ambulatory Visit: Payer: Self-pay

## 2019-08-29 ENCOUNTER — Telehealth (HOSPITAL_COMMUNITY): Payer: Self-pay | Admitting: Hematology

## 2019-08-29 ENCOUNTER — Ambulatory Visit (HOSPITAL_COMMUNITY)
Admission: RE | Admit: 2019-08-29 | Discharge: 2019-08-29 | Disposition: A | Discharge status: Home / Self Care | Payer: Medicare HMO | Source: Ambulatory Visit | Attending: Hematology | Admitting: Hematology

## 2019-08-29 DIAGNOSIS — C787 Secondary malignant neoplasm of liver and intrahepatic bile duct: Secondary | ICD-10-CM | POA: Insufficient documentation

## 2019-08-29 DIAGNOSIS — C259 Malignant neoplasm of pancreas, unspecified: Secondary | ICD-10-CM | POA: Diagnosis present

## 2019-08-29 MED ORDER — IOHEXOL 300 MG/ML  SOLN
100.0000 mL | Freq: Once | INTRAMUSCULAR | Status: AC | PRN
  Administered 2019-08-29: 100 mL via INTRAVENOUS

## 2019-08-30 ENCOUNTER — Ambulatory Visit (HOSPITAL_COMMUNITY): Payer: Medicare HMO

## 2019-08-31 ENCOUNTER — Inpatient Hospital Stay (HOSPITAL_BASED_OUTPATIENT_CLINIC_OR_DEPARTMENT_OTHER): Payer: Medicare HMO | Admitting: Hematology

## 2019-08-31 ENCOUNTER — Encounter (HOSPITAL_COMMUNITY): Payer: Self-pay

## 2019-08-31 ENCOUNTER — Inpatient Hospital Stay (HOSPITAL_COMMUNITY): Payer: Medicare HMO

## 2019-08-31 ENCOUNTER — Other Ambulatory Visit: Payer: Self-pay

## 2019-08-31 ENCOUNTER — Inpatient Hospital Stay (HOSPITAL_COMMUNITY): Payer: Medicare HMO | Attending: Hematology

## 2019-08-31 VITALS — BP 137/70 | HR 78 | Temp 98.1°F | Resp 18

## 2019-08-31 DIAGNOSIS — Z95828 Presence of other vascular implants and grafts: Secondary | ICD-10-CM

## 2019-08-31 DIAGNOSIS — Z794 Long term (current) use of insulin: Secondary | ICD-10-CM | POA: Diagnosis not present

## 2019-08-31 DIAGNOSIS — R109 Unspecified abdominal pain: Secondary | ICD-10-CM | POA: Diagnosis not present

## 2019-08-31 DIAGNOSIS — Z79899 Other long term (current) drug therapy: Secondary | ICD-10-CM | POA: Insufficient documentation

## 2019-08-31 DIAGNOSIS — C259 Malignant neoplasm of pancreas, unspecified: Secondary | ICD-10-CM

## 2019-08-31 DIAGNOSIS — Z8249 Family history of ischemic heart disease and other diseases of the circulatory system: Secondary | ICD-10-CM | POA: Diagnosis not present

## 2019-08-31 DIAGNOSIS — C252 Malignant neoplasm of tail of pancreas: Secondary | ICD-10-CM | POA: Insufficient documentation

## 2019-08-31 DIAGNOSIS — I10 Essential (primary) hypertension: Secondary | ICD-10-CM | POA: Insufficient documentation

## 2019-08-31 DIAGNOSIS — F17210 Nicotine dependence, cigarettes, uncomplicated: Secondary | ICD-10-CM | POA: Insufficient documentation

## 2019-08-31 DIAGNOSIS — Z801 Family history of malignant neoplasm of trachea, bronchus and lung: Secondary | ICD-10-CM | POA: Insufficient documentation

## 2019-08-31 DIAGNOSIS — C787 Secondary malignant neoplasm of liver and intrahepatic bile duct: Secondary | ICD-10-CM | POA: Diagnosis not present

## 2019-08-31 DIAGNOSIS — K5900 Constipation, unspecified: Secondary | ICD-10-CM | POA: Insufficient documentation

## 2019-08-31 DIAGNOSIS — G629 Polyneuropathy, unspecified: Secondary | ICD-10-CM | POA: Insufficient documentation

## 2019-08-31 DIAGNOSIS — E119 Type 2 diabetes mellitus without complications: Secondary | ICD-10-CM | POA: Diagnosis not present

## 2019-08-31 DIAGNOSIS — Z5111 Encounter for antineoplastic chemotherapy: Secondary | ICD-10-CM | POA: Diagnosis not present

## 2019-08-31 LAB — CBC WITH DIFFERENTIAL/PLATELET
Abs Immature Granulocytes: 0.01 10*3/uL (ref 0.00–0.07)
Basophils Absolute: 0 10*3/uL (ref 0.0–0.1)
Basophils Relative: 0 %
Eosinophils Absolute: 0.4 10*3/uL (ref 0.0–0.5)
Eosinophils Relative: 7 %
HCT: 39.5 % (ref 36.0–46.0)
Hemoglobin: 11.8 g/dL — ABNORMAL LOW (ref 12.0–15.0)
Immature Granulocytes: 0 %
Lymphocytes Relative: 34 %
Lymphs Abs: 1.8 10*3/uL (ref 0.7–4.0)
MCH: 25.3 pg — ABNORMAL LOW (ref 26.0–34.0)
MCHC: 29.9 g/dL — ABNORMAL LOW (ref 30.0–36.0)
MCV: 84.8 fL (ref 80.0–100.0)
Monocytes Absolute: 0.6 10*3/uL (ref 0.1–1.0)
Monocytes Relative: 11 %
Neutro Abs: 2.5 10*3/uL (ref 1.7–7.7)
Neutrophils Relative %: 48 %
Platelets: 197 10*3/uL (ref 150–400)
RBC: 4.66 MIL/uL (ref 3.87–5.11)
RDW: 18.5 % — ABNORMAL HIGH (ref 11.5–15.5)
WBC: 5.3 10*3/uL (ref 4.0–10.5)
nRBC: 0 % (ref 0.0–0.2)

## 2019-08-31 LAB — COMPREHENSIVE METABOLIC PANEL
ALT: 20 U/L (ref 0–44)
AST: 19 U/L (ref 15–41)
Albumin: 3.5 g/dL (ref 3.5–5.0)
Alkaline Phosphatase: 75 U/L (ref 38–126)
Anion gap: 10 (ref 5–15)
BUN: 8 mg/dL (ref 6–20)
CO2: 27 mmol/L (ref 22–32)
Calcium: 8.5 mg/dL — ABNORMAL LOW (ref 8.9–10.3)
Chloride: 99 mmol/L (ref 98–111)
Creatinine, Ser: 0.89 mg/dL (ref 0.44–1.00)
GFR calc Af Amer: >60 mL/min (ref >60)
GFR calc non Af Amer: >60 mL/min (ref >60)
Glucose, Bld: 244 mg/dL — ABNORMAL HIGH (ref 70–99)
Potassium: 4 mmol/L (ref 3.5–5.1)
Sodium: 136 mmol/L (ref 135–145)
Total Bilirubin: 0.4 mg/dL (ref 0.3–1.2)
Total Protein: 6.3 g/dL — ABNORMAL LOW (ref 6.5–8.1)

## 2019-08-31 MED ORDER — SODIUM CHLORIDE 0.9 % IV SOLN
Freq: Once | INTRAVENOUS | Status: AC
  Administered 2019-08-31: 09:00:00 via INTRAVENOUS

## 2019-08-31 MED ORDER — SODIUM CHLORIDE 0.9 % IV SOLN: Freq: Once | INTRAVENOUS | Status: DC

## 2019-08-31 MED ORDER — HEPARIN SOD (PORK) LOCK FLUSH 100 UNIT/ML IV SOLN
500.0000 [IU] | Freq: Once | INTRAVENOUS | Status: AC | PRN
  Administered 2019-08-31: 500 [IU]

## 2019-08-31 MED ORDER — DEXAMETHASONE SODIUM PHOSPHATE 100 MG/10ML IJ SOLN
Freq: Once | INTRAMUSCULAR | Status: AC
Start: 2019-08-31 — End: 2019-08-31
  Administered 2019-08-31: 10:00:00 via INTRAVENOUS
  Filled 2019-08-31: qty 4

## 2019-08-31 MED ORDER — PALONOSETRON HCL INJECTION 0.25 MG/5ML
INTRAVENOUS | Status: AC
  Filled 2019-08-31: qty 5

## 2019-08-31 MED ORDER — PACLITAXEL PROTEIN-BOUND CHEMO INJECTION 100 MG
75.0000 mg/m2 | Freq: Once | INTRAVENOUS | Status: AC
  Administered 2019-08-31: 175 mg via INTRAVENOUS
  Filled 2019-08-31: qty 35

## 2019-08-31 MED ORDER — SODIUM CHLORIDE 0.9 % IV SOLN
800.0000 mg/m2 | Freq: Once | INTRAVENOUS | Status: AC
  Administered 2019-08-31: 1786 mg via INTRAVENOUS
  Filled 2019-08-31: qty 26.3

## 2019-08-31 MED ORDER — SODIUM CHLORIDE 0.9% FLUSH
10.0000 mL | INTRAVENOUS | Status: DC | PRN
  Administered 2019-08-31: 10 mL
  Filled 2019-08-31: qty 10

## 2019-08-31 NOTE — Progress Notes (Signed)
[place], [place] [address]   CLINIC:  Medical Oncology/Hematology  PCP:  Patient, No Pcp Per No address on file None   REASON FOR VISIT:  Follow-up for metastatic pancreatic cancer to the liver.   BRIEF ONCOLOGIC HISTORY:  Oncology History  Pancreatic cancer metastasized to liver ([place])  05/09/2019 Initial Diagnosis   Pancreatic cancer metastasized to liver ([place])   05/17/2019 -  Chemotherapy   The patient had PACLitaxel-protein bound (ABRAXANE) chemo infusion 275 mg, 125 mg/m2 = 275 mg, Intravenous,  Once, 4 of 5 cycles Dose modification: 100 mg/m2 (80 % of original dose 125 mg/m2, Cycle 1, Reason: Other (see comments), Comment: neutropenia), 93.75 mg/m2 (75 % of original dose 125 mg/m2, Cycle 1, Reason: Other (see comments), Comment: neutropenia), 100 mg/m2 (80 % of original dose 125 mg/m2, Cycle 2, Reason: Other (see comments), Comment: cytopenias with previous treatment), 75 mg/m2 (original dose 125 mg/m2, Cycle 3, Reason: Provider Judgment, Comment: neuropathy stated as 10/10 dose reduce required per NP Reynolds Bowl) Administration: 275 mg (05/17/2019), 225 mg (05/24/2019), 200 mg (05/31/2019), 225 mg (06/14/2019), 225 mg (06/28/2019), 225 mg (07/12/2019), 175 mg (07/19/2019), 175 mg (08/16/2019), 175 mg (08/31/2019) ondansetron (ZOFRAN) 8 mg in sodium chloride 0.9 % 50 mL IVPB, 8 mg (100 % of original dose 8 mg), Intravenous,  Once, 1 of 1 cycle Dose modification: 8 mg (original dose 8 mg, Cycle 1) gemcitabine (GEMZAR) 2,200 mg in sodium chloride 0.9 % 250 mL chemo infusion, 2,204 mg, Intravenous,  Once, 4 of 5 cycles Dose modification: 750 mg/m2 (75 % of original dose 1,000 mg/m2, Cycle 1, Reason: Other (see comments), Comment: neutropenia), 750 mg/m2 (75 % of original dose 1,000 mg/m2, Cycle 1, Reason: Other (see comments), Comment: neutropenia), 800 mg/m2 (80 % of original dose 1,000 mg/m2, Cycle 2, Reason: Other (see comments), Comment: cytopenia)  Administration: 2,200 mg (05/17/2019), 1,600 mg (05/24/2019), 1,600 mg (05/31/2019), 1,786 mg (06/14/2019), 1,786 mg (06/28/2019), 1,786 mg (07/12/2019), 1,786 mg (07/19/2019), 1,786 mg (08/16/2019), 1,786 mg (08/31/2019) ondansetron (ZOFRAN) 8 mg, dexamethasone (DECADRON) 10 mg in sodium chloride 0.9 % 50 mL IVPB, , Intravenous,  Once, 4 of 5 cycles Administration:  (05/17/2019),  (05/24/2019),  (05/31/2019),  (06/14/2019),  (06/28/2019),  (07/12/2019),  (07/19/2019),  (08/16/2019),  (08/31/2019)  for chemotherapy treatment.       CANCER STAGING: Cancer Staging No matching staging information was found for the patient.   INTERVAL HISTORY:  Ms. Floren 58 y.o. female seen for follow-up of metastatic pancreatic cancer prior to chemotherapy and toxicity assessment.  She had CT scans done on 08/29/2019.  Appetite is 100%.  Energy levels are 60%.  Numbness in the bottom of the feet present.  She is taking lactulose for constipation which is helping.  She stopped taking Linzess.  Gabapentin helped with pins-and-needles sensation.  Vague abdominal pain is reported today.  Denies any nausea or vomiting.  REVIEW OF SYSTEMS:  Review of Systems  Respiratory: Positive for shortness of breath.   Cardiovascular: Positive for leg swelling.  Gastrointestinal: Positive for abdominal pain.  Neurological: Positive for numbness.  All other systems reviewed and are negative.    PAST MEDICAL/SURGICAL HISTORY:  Past Medical History:  Diagnosis Date  . Diabetes mellitus without complication ([place])   . Family history of prostate cancer   . Family history of stomach cancer   . Hypertension   . Pancreatic cancer ([place])   . Port-A-Cath in place 05/16/2019   Past Surgical History:  Procedure  Laterality Date  . ABDOMINAL HYSTERECTOMY    . BACK SURGERY    . KIDNEY SURGERY     per pt, had leakage  . tubal ligation Left      SOCIAL HISTORY:  Social History   Socioeconomic History  . Marital status: Divorced     Spouse name: Not on file  . Number of children: 1  . Years of education: Not on file  . Highest education level: Not on file  Occupational History  . Occupation: disabled  Social Needs  . Financial resource strain: Not hard at all  . Food insecurity    Worry: Never true    Inability: Never true  . Transportation needs    Medical: Yes    Non-medical: Yes  Tobacco Use  . Smoking status: Current Every Day Smoker    Packs/day: 1.00    Years: 45.00    Pack years: 45.00    Types: Cigarettes  . Smokeless tobacco: Never Used  Substance and Sexual Activity  . Alcohol use: Not Currently  . Drug use: Never  . Sexual activity: Not on file  Lifestyle  . Physical activity    Days per week: 0 days    Minutes per session: 0 min  . Stress: Not at all  Relationships  . Social connections    Talks on phone: More than three times a week    Gets together: More than three times a week    Attends religious service: 1 to 4 times per year    Active member of club or organization: No    Attends meetings of clubs or organizations: Never    Relationship status: Divorced  . Intimate partner violence    Fear of current or ex partner: No    Emotionally abused: No    Physically abused: No    Forced sexual activity: No  Other Topics Concern  . Not on file  Social History Narrative  . Not on file    FAMILY HISTORY:  Family History  Problem Relation Age of Onset  . Diabetes Mother   . Hypertension Mother   . Lung cancer Mother 34       d. 40  . Diabetes Father   . Hypertension Father   . Heart disease Brother   . Stomach cancer Paternal Aunt 41  . Stroke Paternal Grandmother   . Prostate cancer Other        PGMs brother  . Stomach cancer Other        PGMs mother    CURRENT MEDICATIONS:  Outpatient Encounter Medications as of 08/31/2019  Medication Sig  . amLODipine (NORVASC) 5 MG tablet Take 1 tablet (5 mg total) by mouth 2 (two) times daily.  Marland Kitchen gabapentin (NEURONTIN) 300 MG  capsule Take 1 capsule (300 mg total) by mouth 3 (three) times daily.  . Gemcitabine HCl (GEMZAR IV) Inject into the vein. Days 1, 8, 15 q 28 days  . insulin aspart (NOVOLOG) 100 UNIT/ML injection Inject 20 Units into the skin 3 (three) times daily before meals.  . Lactulose 20 GM/30ML SOLN Take 30 mLs (20 g total) by mouth at bedtime as needed. (Patient not taking: Reported on 07/19/2019)  . lidocaine-prilocaine (EMLA) cream Apply small amount to port a cath site and cover with plastic wrap 1 hour prior to chemotherapy appointments (Patient not taking: Reported on 07/12/2019)  . linaclotide (LINZESS) 145 MCG CAPS capsule Take 1 capsule (145 mcg total) by mouth daily before breakfast.  . lisinopril (  ZESTRIL) 40 MG tablet Take 1 tablet (40 mg total) by mouth daily.  . ondansetron (ZOFRAN) 4 MG tablet Take 1 tablet (4 mg total) by mouth 3 (three) times daily. (Patient not taking: Reported on 08/16/2019)  . Oxycodone HCl 10 MG TABS Take 1 tablet (10 mg total) by mouth every 6 (six) hours as needed.  Marland Kitchen PACLitaxel Protein-Bound Part (ABRAXANE IV) Inject into the vein. Days 1, 8, 15 q 28 days  . prochlorperazine (COMPAZINE) 10 MG tablet Take 1 tablet (10 mg total) by mouth every 6 (six) hours as needed (Nausea or vomiting). (Patient not taking: Reported on 07/12/2019)   No facility-administered encounter medications on file as of 08/31/2019.     ALLERGIES:  Allergies  Allergen Reactions  . Hydrocodone Hives  . Tramadol Itching     PHYSICAL EXAM:  ECOG Performance status: 1  Vitals:   08/31/19 0749  BP: (!) 148/80  Pulse: 94  Resp: 18  Temp: 97.9 F (36.6 C)  SpO2: 95%   Filed Weights   08/31/19 0749  Weight: 243 lb 3.2 oz (110.3 kg)    Physical Exam Vitals signs reviewed.  Constitutional:      Appearance: Normal appearance.  Cardiovascular:     Rate and Rhythm: Normal rate and regular rhythm.     Heart sounds: Normal heart sounds.  Pulmonary:     Effort: Pulmonary effort is  normal.     Breath sounds: Normal breath sounds.  Abdominal:     General: There is no distension.     Palpations: Abdomen is soft. There is no mass.  Musculoskeletal:        General: No swelling.  Lymphadenopathy:     Cervical: No cervical adenopathy.  Skin:    General: Skin is warm.  Neurological:     General: No focal deficit present.     Mental Status: She is alert and oriented to person, place, and time.  Psychiatric:        Mood and Affect: Mood normal.        Behavior: Behavior normal.      LABORATORY DATA:  I have reviewed the labs as listed.  CBC    Component Value Date/Time   WBC 5.3 08/31/2019 0739   RBC 4.66 08/31/2019 0739   HGB 11.8 (L) 08/31/2019 0739   HCT 39.5 08/31/2019 0739   PLT 197 08/31/2019 0739   MCV 84.8 08/31/2019 0739   MCH 25.3 (L) 08/31/2019 0739   MCHC 29.9 (L) 08/31/2019 0739   RDW 18.5 (H) 08/31/2019 0739   LYMPHSABS 1.8 08/31/2019 0739   MONOABS 0.6 08/31/2019 0739   EOSABS 0.4 08/31/2019 0739   BASOSABS 0.0 08/31/2019 0739   CMP Latest Ref Rng & Units 08/31/2019 08/16/2019 07/19/2019  Glucose 70 - 99 mg/dL 244(H) 199(H) 202(H)  BUN 6 - 20 mg/dL _0 Creatinine 0.44 - 1.00 mg/dL 0.89 0.70 0.68  Sodium 135 - 145 mmol/L 136 138 137  Potassium 3.5 - 5.1 mmol/L 4.0 3.9 3.8  Chloride 98 - 111 mmol/L 99 102 102  CO2 22 - 32 mmol/L _1 Calcium 8.9 - 10.3 mg/dL 8.5(L) 9.0 9.0  Total Protein 6.5 - 8.1 g/dL 6.3(L) 6.8 6.9  Total Bilirubin 0.3 - 1.2 mg/dL 0.4 0.5 <0.1(L)  Alkaline Phos 38 - 126 U/L 75 76 65  AST 15 - 41 U/L _2 ALT 0 - 44 U/L 20 16 34       DIAGNOSTIC IMAGING:  I have independently reviewed the scans and discussed with the patient.   I have reviewed Venita Lick LPN's note and agree with the documentation.  I personally performed a face-to-face visit, made revisions and my assessment and plan is as follows.    ASSESSMENT & PLAN:   Pancreatic cancer metastasized to liver ([place]) 1.  Metastatic  pancreatic cancer to the liver: -Biopsy of the pancreatic tail mass on 01/05/2019, moderately to poorly differentiated adenocarcinoma. -Foundation 1 testing shows MS-stable, K-ras G 12 V, CDK 6 amplification, ARID1A - 3 cycles of FOLFIRINOX from 02/25/2019 through 03/28/2019. -CTAP on 04/04/2019 shows pancreatic mass measuring 3.3 x 4.7 x 2.9 cm, hypodense lesions in the liver, largest 3.2 cm in the right hepatic lobe and 1.4 cm lesion adjacent to the gallbladder fossa.  There is a 3.3 cm hypodense lesion adjacent to the falciform ligament.  Scan was compared to prior scan from 10/21/2018. - She was thought to have progression in chemotherapy was switched to gemcitabine and Abraxane.  She received 2 doses on 03/14/2019 and 03/28/2019 in [place]. -I do not believe she had a true progression on FOLFIRINOX. -3 cycles of gemcitabine and Abraxane, day 1 and day 8 from 05/17/2019 through 07/12/2019. -She missed several follow-up appointments.  Developed numbness in the feet.  Cycle 3-day 8 Abraxane decreased to 75 mg/m2. -Cycle 4-day 1 was on 08/16/2019.  We switched her to day 1 and day 15 treatments. -We reviewed CT CAP from 08/29/2019.  No suspicious liver lesions were seen in the parenchyma.  Pancreatic mass measures 4.1 x 2.8 cm in the tail.  No dilatation of the main pancreatic duct.  The mass appears to involve the right adrenal gland and obliterates splenic vein.  Scattered tiny bilateral pulmonary nodules, too small to characterize.  CA 19-9 has always been normal. -She will proceed with day 15 of cycle 4 today.  We will continue with day 1 and day 15 with each cycle. -She will come back in 2 weeks for follow-up.  2.  Abdominal pain: -She is taking oxycodone 10 mg every 6 hours as needed provided by her PMD.  3.  Constipation: -She is using lactulose which is helping.  She stopped taking Linzess.  4.  Peripheral neuropathy: -She will continue gabapentin 3 times a day.  Neuropathic pain  has improved.  She still has numbness.  Total time spent is 25 minutes with more than 50% of the time spent face-to-face discussing scan results, treatment plan, counseling and coordination of care.    Orders placed this encounter:  No orders of the defined types were placed in this encounter.     Derek Jack, MD [place] [phone]

## 2019-08-31 NOTE — Patient Instructions (Signed)
[facility] Discharge Instructions for Patients Receiving Chemotherapy   Beginning January 23rd 2017 lab work for the Cancer Center will be done in the  Main lab at [facility] on 1st floor. If you have a lab appointment with the Cancer Center please come in thru the  Main Entrance and check in at the main information desk   Today you received the following chemotherapy agents Abraxane and Gemzar. Follow-up as scheduled. Call clinic for any questions or concerns  To help prevent nausea and vomiting after your treatment, we encourage you to take your nausea medication   If you develop nausea and vomiting, or diarrhea that is not controlled by your medication, call the clinic.  The clinic phone number is [phone]. Office hours are Monday-Friday 8:30am-5:00pm.  BELOW ARE SYMPTOMS THAT SHOULD BE REPORTED IMMEDIATELY:  *FEVER GREATER THAN 101.0 F  *CHILLS WITH OR WITHOUT FEVER  NAUSEA AND VOMITING THAT IS NOT CONTROLLED WITH YOUR NAUSEA MEDICATION  *UNUSUAL SHORTNESS OF BREATH  *UNUSUAL BRUISING OR BLEEDING  TENDERNESS IN MOUTH AND THROAT WITH OR WITHOUT PRESENCE OF ULCERS  *URINARY PROBLEMS  *BOWEL PROBLEMS  UNUSUAL RASH Items with * indicate a potential emergency and should be followed up as soon as possible. If you have an emergency after office hours please contact your primary care physician or go to the nearest emergency department.  Please call the clinic during office hours if you have any questions or concerns.   You may also contact the Patient Navigator at [phone] should you have any questions or need assistance in obtaining follow up care.      Resources For Cancer Patients and their Caregivers ? American Cancer Society: Can assist with transportation, wigs, general needs, runs Look Good Feel Better.        1-888-227-6333 ? Cancer Care: Provides financial assistance, online support groups, medication/co-pay assistance.   1-800-813-HOPE (4673) ? [facility] Assists [place] cancer patients and their families through emotional , educational and financial support.  336-427-4357 ? [place] DSS Where to apply for food stamps, Medicaid and utility assistance. 336-342-1394 ? [facility]: Transportation to medical appointments. 336-347-2287 ? Social Security Administration: May apply for disability if have a Stage IV cancer. 336-342-7796 1-800-772-1213 ? [place] Aging, Disability and Transit Services: Assists with nutrition, care and transit needs. 336-349-2343         

## 2019-08-31 NOTE — Progress Notes (Signed)
0910 Labs reviewed with and pt seen by Dr. Lamonte Richer and pt approved for Gemzar and Abraxane infusions today per MD         Doris [name] tolerated Gemzar and Abraxane well without complaints or incident. VSS upon discharge. Pt discharged self ambulatory in satisfactory condition

## 2019-08-31 NOTE — Patient Instructions (Addendum)
[facility] at [facility] Discharge Instructions  You were seen today by Dr. Katragadda. He went over your recent lab, scan results. He will see you back in 2 weeks for labs and follow up.   Thank you for choosing [facility] at [facility] to provide your oncology and hematology care.  To afford each patient quality time with our provider, please arrive at least 15 minutes before your scheduled appointment time.   If you have a lab appointment with the Cancer Center please come in thru the  Main Entrance and check in at the main information desk  You need to re-schedule your appointment should you arrive 10 or more minutes late.  We strive to give you quality time with our providers, and arriving late affects you and other patients whose appointments are after yours.  Also, if you no show three or more times for appointments you may be dismissed from the clinic at the providers discretion.     Again, thank you for choosing [facility].  Our hope is that these requests will decrease the amount of time that you wait before being seen by our physicians.       _____________________________________________________________  Should you have questions after your visit to [facility], please contact our office at [phone] between the hours of 8:00 a.m. and 4:30 p.m.  Voicemails left after 4:00 p.m. will not be returned until the following business day.  For prescription refill requests, have your pharmacy contact our office and allow 72 hours.    Cancer Center Support Programs:   > Cancer Support Group  2nd Tuesday of the month 1pm-2pm, Journey Room    

## 2019-08-31 NOTE — Assessment & Plan Note (Addendum)
1.  Metastatic pancreatic cancer to the liver: -Biopsy of the pancreatic tail mass on 01/05/2019, moderately to poorly differentiated adenocarcinoma. -Foundation 1 testing shows MS-stable, K-ras G 12 V, CDK 6 amplification, ARID1A - 3 cycles of FOLFIRINOX from 02/25/2019 through 03/28/2019. -CTAP on 04/04/2019 shows pancreatic mass measuring 3.3 x 4.7 x 2.9 cm, hypodense lesions in the liver, largest 3.2 cm in the right hepatic lobe and 1.4 cm lesion adjacent to the gallbladder fossa.  There is a 3.3 cm hypodense lesion adjacent to the falciform ligament.  Scan was compared to prior scan from 10/21/2018. - She was thought to have progression in chemotherapy was switched to gemcitabine and Abraxane.  She received 2 doses on 03/14/2019 and 03/28/2019 in [place]. -I do not believe she had a true progression on FOLFIRINOX. -3 cycles of gemcitabine and Abraxane, day 1 and day 8 from 05/17/2019 through 07/12/2019. -She missed several follow-up appointments.  Developed numbness in the feet.  Cycle 3-day 8 Abraxane decreased to 75 mg/m2. -Cycle 4-day 1 was on 08/16/2019.  We switched her to day 1 and day 15 treatments. -We reviewed CT CAP from 08/29/2019.  No suspicious liver lesions were seen in the parenchyma.  Pancreatic mass measures 4.1 x 2.8 cm in the tail.  No dilatation of the main pancreatic duct.  The mass appears to involve the right adrenal gland and obliterates splenic vein.  Scattered tiny bilateral pulmonary nodules, too small to characterize.  CA 19-9 has always been normal. -She will proceed with day 15 of cycle 4 today.  We will continue with day 1 and day 15 with each cycle. -She will come back in 2 weeks for follow-up.  2.  Abdominal pain: -She is taking oxycodone 10 mg every 6 hours as needed provided by her PMD.  3.  Constipation: -She is using lactulose which is helping.  She stopped taking Linzess.  4.  Peripheral neuropathy: -She will continue gabapentin 3 times a day.   Neuropathic pain has improved.  She still has numbness.

## 2019-09-01 ENCOUNTER — Encounter (HOSPITAL_COMMUNITY): Payer: Self-pay | Admitting: Hematology

## 2019-09-02 ENCOUNTER — Telehealth: Payer: Self-pay | Admitting: Genetic Counselor

## 2019-09-02 ENCOUNTER — Encounter: Payer: Self-pay | Admitting: Genetic Counselor

## 2019-09-02 DIAGNOSIS — Z1379 Encounter for other screening for genetic and chromosomal anomalies: Secondary | ICD-10-CM | POA: Insufficient documentation

## 2019-09-02 NOTE — Telephone Encounter (Signed)
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  Most research for cancer risks in in the slavik mutation which is NOT what was found in her.  Therefore, the cancer risk is unknown.  We will see her for genetic counseling on 12/16.

## 2019-09-12 ENCOUNTER — Other Ambulatory Visit (HOSPITAL_COMMUNITY): Payer: Self-pay | Admitting: Hematology

## 2019-09-14 ENCOUNTER — Other Ambulatory Visit (HOSPITAL_COMMUNITY): Payer: Medicare HMO

## 2019-09-14 ENCOUNTER — Ambulatory Visit (HOSPITAL_COMMUNITY): Payer: Medicare HMO

## 2019-09-14 ENCOUNTER — Inpatient Hospital Stay: Payer: Medicare HMO | Admitting: Genetic Counselor

## 2019-09-14 ENCOUNTER — Ambulatory Visit (HOSPITAL_COMMUNITY): Payer: Medicare HMO | Admitting: Hematology

## 2019-09-19 ENCOUNTER — Other Ambulatory Visit: Payer: Self-pay

## 2019-09-19 ENCOUNTER — Inpatient Hospital Stay (HOSPITAL_COMMUNITY): Payer: Medicare HMO

## 2019-09-19 ENCOUNTER — Emergency Department (HOSPITAL_COMMUNITY): Payer: Medicare HMO

## 2019-09-19 ENCOUNTER — Encounter (HOSPITAL_COMMUNITY): Payer: Self-pay | Admitting: Hematology

## 2019-09-19 ENCOUNTER — Ambulatory Visit (HOSPITAL_COMMUNITY): Payer: Medicare HMO

## 2019-09-19 ENCOUNTER — Encounter (HOSPITAL_COMMUNITY): Payer: Self-pay

## 2019-09-19 ENCOUNTER — Inpatient Hospital Stay: Payer: Medicare HMO | Admitting: Genetic Counselor

## 2019-09-19 ENCOUNTER — Inpatient Hospital Stay (HOSPITAL_BASED_OUTPATIENT_CLINIC_OR_DEPARTMENT_OTHER): Payer: Medicare HMO | Admitting: Hematology

## 2019-09-19 ENCOUNTER — Emergency Department (HOSPITAL_COMMUNITY)
Admission: EM | Admit: 2019-09-19 | Discharge: 2019-09-19 | Discharge status: Against Medical Advice | Payer: Medicare HMO | Attending: Emergency Medicine | Admitting: Emergency Medicine

## 2019-09-19 VITALS — BP 159/85 | HR 81 | Temp 97.9°F | Resp 18 | Wt 232.4 lb

## 2019-09-19 DIAGNOSIS — C259 Malignant neoplasm of pancreas, unspecified: Secondary | ICD-10-CM | POA: Insufficient documentation

## 2019-09-19 DIAGNOSIS — R1010 Upper abdominal pain, unspecified: Secondary | ICD-10-CM | POA: Diagnosis present

## 2019-09-19 DIAGNOSIS — I10 Essential (primary) hypertension: Secondary | ICD-10-CM | POA: Diagnosis not present

## 2019-09-19 DIAGNOSIS — C787 Secondary malignant neoplasm of liver and intrahepatic bile duct: Secondary | ICD-10-CM

## 2019-09-19 DIAGNOSIS — Z794 Long term (current) use of insulin: Secondary | ICD-10-CM | POA: Insufficient documentation

## 2019-09-19 DIAGNOSIS — E119 Type 2 diabetes mellitus without complications: Secondary | ICD-10-CM | POA: Insufficient documentation

## 2019-09-19 DIAGNOSIS — Z79899 Other long term (current) drug therapy: Secondary | ICD-10-CM | POA: Insufficient documentation

## 2019-09-19 DIAGNOSIS — F17210 Nicotine dependence, cigarettes, uncomplicated: Secondary | ICD-10-CM | POA: Diagnosis not present

## 2019-09-19 LAB — URINALYSIS, ROUTINE W REFLEX MICROSCOPIC
Bilirubin Urine: NEGATIVE
Glucose, UA: NEGATIVE mg/dL
Hgb urine dipstick: NEGATIVE
Ketones, ur: NEGATIVE mg/dL
Leukocytes,Ua: NEGATIVE
Nitrite: NEGATIVE
Protein, ur: NEGATIVE mg/dL
Specific Gravity, Urine: 1.003 — ABNORMAL LOW (ref 1.005–1.030)
pH: 6 (ref 5.0–8.0)

## 2019-09-19 LAB — CBC WITH DIFFERENTIAL/PLATELET
Abs Immature Granulocytes: 0.02 10*3/uL (ref 0.00–0.07)
Basophils Absolute: 0 10*3/uL (ref 0.0–0.1)
Basophils Relative: 1 %
Eosinophils Absolute: 0.2 10*3/uL (ref 0.0–0.5)
Eosinophils Relative: 3 %
HCT: 43.9 % (ref 36.0–46.0)
Hemoglobin: 13.4 g/dL (ref 12.0–15.0)
Immature Granulocytes: 0 %
Lymphocytes Relative: 31 %
Lymphs Abs: 2 10*3/uL (ref 0.7–4.0)
MCH: 24.8 pg — ABNORMAL LOW (ref 26.0–34.0)
MCHC: 30.5 g/dL (ref 30.0–36.0)
MCV: 81.3 fL (ref 80.0–100.0)
Monocytes Absolute: 0.6 10*3/uL (ref 0.1–1.0)
Monocytes Relative: 9 %
Neutro Abs: 3.7 10*3/uL (ref 1.7–7.7)
Neutrophils Relative %: 56 %
Platelets: 324 10*3/uL (ref 150–400)
RBC: 5.4 MIL/uL — ABNORMAL HIGH (ref 3.87–5.11)
RDW: 19 % — ABNORMAL HIGH (ref 11.5–15.5)
WBC: 6.6 10*3/uL (ref 4.0–10.5)
nRBC: 0 % (ref 0.0–0.2)

## 2019-09-19 LAB — COMPREHENSIVE METABOLIC PANEL
ALT: 23 U/L (ref 0–44)
AST: 29 U/L (ref 15–41)
Albumin: 3.8 g/dL (ref 3.5–5.0)
Alkaline Phosphatase: 78 U/L (ref 38–126)
Anion gap: 12 (ref 5–15)
BUN: 8 mg/dL (ref 6–20)
CO2: 25 mmol/L (ref 22–32)
Calcium: 9 mg/dL (ref 8.9–10.3)
Chloride: 100 mmol/L (ref 98–111)
Creatinine, Ser: 0.76 mg/dL (ref 0.44–1.00)
GFR calc Af Amer: >60 mL/min (ref >60)
GFR calc non Af Amer: >60 mL/min (ref >60)
Glucose, Bld: 258 mg/dL — ABNORMAL HIGH (ref 70–99)
Potassium: 3.3 mmol/L — ABNORMAL LOW (ref 3.5–5.1)
Sodium: 137 mmol/L (ref 135–145)
Total Bilirubin: 0.4 mg/dL (ref 0.3–1.2)
Total Protein: 6.8 g/dL (ref 6.5–8.1)

## 2019-09-19 LAB — AMYLASE: Amylase: 52 U/L (ref 28–100)

## 2019-09-19 LAB — LIPASE, BLOOD: Lipase: 20 U/L (ref 11–51)

## 2019-09-19 MED ORDER — HEPARIN SOD (PORK) LOCK FLUSH 100 UNIT/ML IV SOLN
500.0000 [IU] | Freq: Once | INTRAVENOUS | Status: DC
  Filled 2019-09-19: qty 5

## 2019-09-19 MED ORDER — IOHEXOL 300 MG/ML  SOLN
100.0000 mL | Freq: Once | INTRAMUSCULAR | Status: AC | PRN
  Administered 2019-09-19: 100 mL via INTRAVENOUS

## 2019-09-19 MED ORDER — MORPHINE SULFATE (PF) 2 MG/ML IV SOLN
INTRAVENOUS | Status: AC
  Filled 2019-09-19: qty 1

## 2019-09-19 MED ORDER — MORPHINE SULFATE (PF) 2 MG/ML IV SOLN
2.0000 mg | Freq: Once | INTRAVENOUS | Status: AC
  Administered 2019-09-19: 12:00:00 2 mg via INTRAVENOUS
  Filled 2019-09-19: qty 1

## 2019-09-19 MED ORDER — MORPHINE SULFATE 4 MG/ML IJ SOLN
2.0000 mg | Freq: Once | INTRAMUSCULAR | Status: DC
  Filled 2019-09-19: qty 1

## 2019-09-19 MED ORDER — HEPARIN SOD (PORK) LOCK FLUSH 100 UNIT/ML IV SOLN
500.0000 [IU] | Freq: Once | INTRAVENOUS | Status: AC
  Administered 2019-09-19: 09:00:00 500 [IU] via INTRAVENOUS

## 2019-09-19 MED ORDER — MORPHINE SULFATE 2 MG/ML IJ SOLN
2.0000 mg | Freq: Once | INTRAMUSCULAR | Status: AC
  Administered 2019-09-19: 2 mg via INTRAVENOUS
  Filled 2019-09-19: qty 1

## 2019-09-19 MED ORDER — SODIUM CHLORIDE 0.9% FLUSH
10.0000 mL | INTRAVENOUS | Status: DC | PRN
Start: 2019-09-19 — End: 2019-09-19
  Administered 2019-09-19: 10 mL via INTRAVENOUS

## 2019-09-19 NOTE — ED Provider Notes (Signed)
[facility] EMERGENCY DEPARTMENT Provider Note   CSN: [id number] Arrival date & time: 09/19/19  [id number]     History Chief Complaint  Patient presents with  . Abdominal Pain    Doris [name] is a 58 y.o. female who is currently being treated for primary pancreatic cancer, was at the cancer center for her chemotherapy treatment but presented with severe upper abdominal pain, described as "knife like" and constant, stating she woke with symptoms this am.  She denies fevers ,chills, chest pain, sob, also no nausea or vomiting, denies dysuria or diarrhea. Also denies abdominal distention and has been passing flatus.    She was given morphine 2 mg IV and sent down to the ed for further evaluation and control of her symptoms.  She does report being pain free at this time.   The history is provided by the patient and medical records.       Past Medical History:  Diagnosis Date  . Diabetes mellitus without complication ([place])   . Family history of prostate cancer   . Family history of stomach cancer   . Hypertension   . Pancreatic cancer ([place])   . Port-A-Cath in place 05/16/2019    Patient Active Problem List   Diagnosis Date Noted  . Genetic testing 09/02/2019  . Family history of stomach cancer   . Family history of prostate cancer   . Port-A-Cath in place 05/16/2019  . Pancreatic cancer metastasized to liver ([place]) 05/09/2019  . Goals of care, counseling/discussion 05/09/2019    Past Surgical History:  Procedure Laterality Date  . ABDOMINAL HYSTERECTOMY    . BACK SURGERY    . KIDNEY SURGERY     per pt, had leakage  . tubal ligation Left      OB History   No obstetric history on file.     Family History  Problem Relation Age of Onset  . Diabetes Mother   . Hypertension Mother   . Lung cancer Mother 27       d. 65  . Diabetes Father   . Hypertension Father   . Heart disease Brother   . Stomach cancer Paternal Aunt 60  . Stroke Paternal Grandmother   . Prostate  cancer Other        PGMs brother  . Stomach cancer Other        PGMs mother    Social History   Tobacco Use  . Smoking status: Current Every Day Smoker    Packs/day: 1.00    Years: 45.00    Pack years: 45.00    Types: Cigarettes  . Smokeless tobacco: Never Used  Substance Use Topics  . Alcohol use: Not Currently  . Drug use: Never    Home Medications Prior to Admission medications   Medication Sig Start Date End Date Taking? Authorizing Provider  Gemcitabine HCl (GEMZAR IV) Inject into the vein. Days 1, 8, 15 q 28 days 05/17/19  Yes [provider]  PACLitaxel Protein-Bound Part (ABRAXANE IV) Inject into the vein. Days 1, 8, 15 q 28 days 05/17/19  Yes [provider]  amLODipine (NORVASC) 5 MG tablet Take 1 tablet (5 mg total) by mouth 2 (two) times daily. 06/01/19   Derek Jack, MD  gabapentin (NEURONTIN) 300 MG capsule TAKE 1 CAPSULE(300 MG) BY MOUTH THREE TIMES DAILY 09/12/19   Derek Jack, MD  insulin aspart (NOVOLOG) 100 UNIT/ML injection Inject 20 Units into the skin 3 (three) times daily before meals. 06/01/19   Derek Jack, MD  Lactulose 20 GM/30ML SOLN Take 30 mLs (20 g total) by mouth at bedtime as needed. Patient not taking: Reported on 07/19/2019 07/12/19   Derek Jack, MD  lidocaine-prilocaine (EMLA) cream Apply small amount to port a cath site and cover with plastic wrap 1 hour prior to chemotherapy appointments Patient not taking: Reported on 07/12/2019 06/01/19   Derek Jack, MD  linaclotide [facility]) 145 MCG CAPS capsule Take 1 capsule (145 mcg total) by mouth daily before breakfast. 07/12/19   Derek Jack, MD  lisinopril (ZESTRIL) 40 MG tablet Take 1 tablet (40 mg total) by mouth daily. 06/01/19   Derek Jack, MD  ondansetron (ZOFRAN) 4 MG tablet Take 1 tablet (4 mg total) by mouth 3 (three) times daily. Patient not taking: Reported on 08/16/2019 06/01/19   Derek Jack, MD  Oxycodone  HCl 10 MG TABS Take 1 tablet (10 mg total) by mouth every 6 (six) hours as needed. 06/01/19   Derek Jack, MD  prochlorperazine (COMPAZINE) 10 MG tablet Take 1 tablet (10 mg total) by mouth every 6 (six) hours as needed (Nausea or vomiting). Patient not taking: Reported on 07/12/2019 06/01/19   Derek Jack, MD    Allergies    Hydrocodone and Tramadol  Review of Systems   Review of Systems  Constitutional: Negative for chills and fever.  HENT: Negative for congestion and sore throat.   Eyes: Negative.   Respiratory: Negative for chest tightness and shortness of breath.   Cardiovascular: Negative for chest pain.  Gastrointestinal: Positive for abdominal pain. Negative for abdominal distention, constipation, diarrhea, nausea and vomiting.  Genitourinary: Negative.  Negative for dysuria.  Musculoskeletal: Negative for arthralgias, joint swelling and neck pain.  Skin: Negative.  Negative for rash and wound.  Neurological: Negative for dizziness, weakness, light-headedness, numbness and headaches.  Psychiatric/Behavioral: Negative.     Physical Exam Updated Vital Signs BP (!) 133/56 (BP Location: Right Arm)   Pulse 63   Temp 98.3 F (36.8 C) (Oral)   Resp 18   SpO2 95%   Physical Exam Vitals and nursing note reviewed.  Constitutional:      Appearance: She is well-developed.  HENT:     Head: Normocephalic and atraumatic.  Eyes:     Conjunctiva/sclera: Conjunctivae normal.  Cardiovascular:     Rate and Rhythm: Normal rate and regular rhythm.     Heart sounds: Normal heart sounds.  Pulmonary:     Effort: Pulmonary effort is normal.     Breath sounds: Normal breath sounds. No wheezing.  Abdominal:     General: Bowel sounds are normal.     Palpations: Abdomen is soft.     Tenderness: There is no abdominal tenderness.     Comments: Benign exam without c/o pain, no guarding or rebound.  No distention.  Of note, pt had received morphine just prior to arrival here.    Musculoskeletal:        General: Normal range of motion.     Cervical back: Normal range of motion.  Skin:    General: Skin is warm and dry.  Neurological:     Mental Status: She is alert.     ED Results / Procedures / Treatments   Labs (all labs ordered are listed, but only abnormal results are displayed) Labs Reviewed  URINALYSIS, ROUTINE W REFLEX MICROSCOPIC - Abnormal; Notable for the following components:      Result Value   Color, Urine STRAW (*)    Specific Gravity, Urine 1.003 (*)    All  other components within normal limits   Additional labs including CBC with diff, cmet, lipase obtained at the cancer center prior to arrival reviwed.  Normal wbc count at 6.6 , hgb 13.4.  CMET glucose 258, potassium slightly low at 3.3.  LFTs normal, lipase and amylase are also normal. EKG None  Radiology No results found.  Procedures Procedures (including critical care time)  Medications Ordered in ED Medications  heparin lock flush 100 unit/mL (has no administration in time range)  morphine 2 MG/ML injection 2 mg (2 mg Intravenous Given 09/19/19 1223)  iohexol (OMNIPAQUE) 300 MG/ML solution 100 mL (100 mLs Intravenous Contrast Given 09/19/19 1241)    ED Course  I have reviewed the triage vital signs and the nursing notes.  Pertinent labs & imaging results that were available during my care of the patient were reviewed by me and considered in my medical decision making (see chart for details).    MDM Rules/Calculators/A&P                      CT imaging has been completed at this time.  Patient was found walking down the hallway walking towards the ED entrance.  Discussed with patient that her CT results are currently pending, however she refuses to stay any longer stating it is taking too long to get the CT scan and she was leaving.  She denies have any symptoms currently.  Advised patient that I cannot tell her if she has a surgical emergency or need to be admitted to the  hospital until I have the CT results back.  She still refuses to stay for this result.  She was signed out AMA. Final Clinical Impression(s) / ED Diagnoses Final diagnoses:  Pain of upper abdomen    Rx / DC Orders ED Discharge Orders    None       Landis Martins 09/19/19 1312    Long, Wonda Olds, MD 09/19/19 1429

## 2019-09-19 NOTE — ED Notes (Signed)
Pt refused to wait for results of CT.  Pt was wanting to leave earlier prior to CT due to long wait for CT.  Pt was about to leave when CT showed up to take pt to CT.  Once pt returned, pt wanting to leave AMA.  Explained to pt that by leaving AMA, insurance will not cover ED expense.

## 2019-09-19 NOTE — ED Triage Notes (Signed)
Pt brought down by cancer center due to abdominal pain. Pt scheduled for tx for pancreatic cancer with metastatic to liver. Pt reports she woke up with abdominal pain. Port accessed and 2mg  morphine administered at ca center prior to being brought to ED

## 2019-09-19 NOTE — Patient Instructions (Signed)
[facility] at [facility] Discharge Instructions  Labs drawn from portacath today   Thank you for choosing [facility] at [facility] to provide your oncology and hematology care.  To afford each patient quality time with our provider, please arrive at least 15 minutes before your scheduled appointment time.   If you have a lab appointment with the Cancer Center please come in thru the Main Entrance and check in at the main information desk.  You need to re-schedule your appointment should you arrive 10 or more minutes late.  We strive to give you quality time with our providers, and arriving late affects you and other patients whose appointments are after yours.  Also, if you no show three or more times for appointments you may be dismissed from the clinic at the providers discretion.     Again, thank you for choosing [facility].  Our hope is that these requests will decrease the amount of time that you wait before being seen by our physicians.       _____________________________________________________________  Should you have questions after your visit to  Cancer Center, please contact our office at [phone] between the hours of 8:00 a.m. and 4:30 p.m.  Voicemails left after 4:00 p.m. will not be returned until the following business day.  For prescription refill requests, have your pharmacy contact our office and allow 72 hours.    Due to Covid, you will need to wear a mask upon entering the hospital. If you do not have a mask, a mask will be given to you at the Main Entrance upon arrival. For doctor visits, patients may have 1 support person with them. For treatment visits, patients can not have anyone with them due to social distancing guidelines and our immunocompromised population.     

## 2019-09-19 NOTE — ED Notes (Signed)
Notified cancer center that pt was signing out ama

## 2019-09-19 NOTE — Patient Instructions (Signed)
[place] at [facility] Discharge Instructions  Chemo tx held today and pt sent to ER for pain management   Thank you for choosing [place] at [facility] to provide your oncology and hematology care.  To afford each patient quality time with our provider, please arrive at least 15 minutes before your scheduled appointment time.   If you have a lab appointment with the [place] please come in thru the Main Entrance and check in at the main information desk.  You need to re-schedule your appointment should you arrive 10 or more minutes late.  We strive to give you quality time with our providers, and arriving late affects you and other patients whose appointments are after yours.  Also, if you no show three or more times for appointments you may be dismissed from the clinic at the providers discretion.     Again, thank you for choosing [facility].  Our hope is that these requests will decrease the amount of time that you wait before being seen by our physicians.       _____________________________________________________________  Should you have questions after your visit to [facility], please contact our office at [phone] between the hours of 8:00 a.m. and 4:30 p.m.  Voicemails left after 4:00 p.m. will not be returned until the following business day.  For prescription refill requests, have your pharmacy contact our office and allow 72 hours.    Due to Covid, you will need to wear a mask upon entering the hospital. If you do not have a mask, a mask will be given to you at the Main Entrance upon arrival. For doctor visits, patients may have 1 support person with them. For treatment visits, patients can not have anyone with them due to social distancing guidelines and our immunocompromised population.

## 2019-09-19 NOTE — Patient Instructions (Signed)
[facility] at [facility] Discharge Instructions  You were seen today by Dr. Katragadda. He went over your recent lab results. He will see you back in  for labs and follow up.   Thank you for choosing [facility] at [facility] to provide your oncology and hematology care.  To afford each patient quality time with our provider, please arrive at least 15 minutes before your scheduled appointment time.   If you have a lab appointment with the Cancer Center please come in thru the  Main Entrance and check in at the main information desk  You need to re-schedule your appointment should you arrive 10 or more minutes late.  We strive to give you quality time with our providers, and arriving late affects you and other patients whose appointments are after yours.  Also, if you no show three or more times for appointments you may be dismissed from the clinic at the providers discretion.     Again, thank you for choosing [facility].  Our hope is that these requests will decrease the amount of time that you wait before being seen by our physicians.       _____________________________________________________________  Should you have questions after your visit to [facility], please contact our office at [phone] between the hours of 8:00 a.m. and 4:30 p.m.  Voicemails left after 4:00 p.m. will not be returned until the following business day.  For prescription refill requests, have your pharmacy contact our office and allow 72 hours.    Cancer Center Support Programs:   > Cancer Support Group  2nd Tuesday of the month 1pm-2pm, Journey Room    

## 2019-09-19 NOTE — Progress Notes (Signed)
[place], [place] [address]   CLINIC:  Medical Oncology/Hematology  PCP:  Patient, No Pcp Per No address on file None   REASON FOR VISIT:  Follow-up for metastatic pancreatic cancer to the liver.   BRIEF ONCOLOGIC HISTORY:  Oncology History  Pancreatic cancer metastasized to liver ([place])  05/09/2019 Initial Diagnosis   Pancreatic cancer metastasized to liver ([place])   05/17/2019 -  Chemotherapy   The patient had PACLitaxel-protein bound (ABRAXANE) chemo infusion 275 mg, 125 mg/m2 = 275 mg, Intravenous,  Once, 4 of 5 cycles Dose modification: 100 mg/m2 (80 % of original dose 125 mg/m2, Cycle 1, Reason: Other (see comments), Comment: neutropenia), 93.75 mg/m2 (75 % of original dose 125 mg/m2, Cycle 1, Reason: Other (see comments), Comment: neutropenia), 100 mg/m2 (80 % of original dose 125 mg/m2, Cycle 2, Reason: Other (see comments), Comment: cytopenias with previous treatment), 75 mg/m2 (original dose 125 mg/m2, Cycle 3, Reason: Provider Judgment, Comment: neuropathy stated as 10/10 dose reduce required per NP Reynolds Bowl) Administration: 275 mg (05/17/2019), 225 mg (05/24/2019), 200 mg (05/31/2019), 225 mg (06/14/2019), 225 mg (06/28/2019), 225 mg (07/12/2019), 175 mg (07/19/2019), 175 mg (08/16/2019), 175 mg (08/31/2019) ondansetron (ZOFRAN) 8 mg in sodium chloride 0.9 % 50 mL IVPB, 8 mg (100 % of original dose 8 mg), Intravenous,  Once, 1 of 1 cycle Dose modification: 8 mg (original dose 8 mg, Cycle 1) gemcitabine (GEMZAR) 2,200 mg in sodium chloride 0.9 % 250 mL chemo infusion, 2,204 mg, Intravenous,  Once, 4 of 5 cycles Dose modification: 750 mg/m2 (75 % of original dose 1,000 mg/m2, Cycle 1, Reason: Other (see comments), Comment: neutropenia), 750 mg/m2 (75 % of original dose 1,000 mg/m2, Cycle 1, Reason: Other (see comments), Comment: neutropenia), 800 mg/m2 (80 % of original dose 1,000 mg/m2, Cycle 2, Reason: Other (see comments), Comment:  cytopenia) Administration: 2,200 mg (05/17/2019), 1,600 mg (05/24/2019), 1,600 mg (05/31/2019), 1,786 mg (06/14/2019), 1,786 mg (06/28/2019), 1,786 mg (07/12/2019), 1,786 mg (07/19/2019), 1,786 mg (08/16/2019), 1,786 mg (08/31/2019) ondansetron (ZOFRAN) 8 mg, dexamethasone (DECADRON) 10 mg in sodium chloride 0.9 % 50 mL IVPB, , Intravenous,  Once, 4 of 5 cycles Administration:  (05/17/2019),  (05/24/2019),  (05/31/2019),  (06/14/2019),  (06/28/2019),  (07/12/2019),  (07/19/2019),  (08/16/2019),  (08/31/2019)  for chemotherapy treatment.    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  The Common Hereditary Gene Panel offered by [facility] includes sequencing and/or deletion duplication testing of the following 48 genes: APC, ATM, AXIN2, BARD1, BMPR1A, BRCA1, BRCA2, BRIP1, CDH1, CDK4, CDKN2A (p14ARF), CDKN2A (p16INK4a), CHEK2, CTNNA1, DICER1, EPCAM (Deletion/duplication testing only), GREM1 (promoter region deletion/duplication testing only), KIT, MEN1, MLH1, MSH2, MSH3, MSH6, MUTYH, NBN, NF1, NHTL1, PALB2, PDGFRA, PMS2, POLD1, POLE, PTEN, RAD50, RAD51C, RAD51D, RNF43, SDHB, SDHC, SDHD, SMAD4, SMARCA4. STK11, TP53, TSC1, TSC2, and VHL.  The following genes were evaluated for sequence changes only: SDHA and HOXB13 c.251G>A variant only. The report date is August 02, 2019.      CANCER STAGING: Cancer Staging No matching staging information was found for the patient.   INTERVAL HISTORY:  Doris [name] 58 y.o. female seen for follow-up and start of cycle 5.  She received cycle 4-day 15 2 weeks ago.  Appetite is 50%.  Energy levels are 25%.  She woke up and had a bowel movement this morning.  She started having severe abdominal pain which she describes as a aching type.  She reported decreased  appetite and lost about 11 pounds.  She is very tearful due to the pain.  Pain does not radiate anywhere.  It is rated as 10 out of 10.  Not associated with any  nausea, vomiting or diarrhea.  Neuropathy in the feet has been stable.  REVIEW OF SYSTEMS:  Review of Systems  Constitutional: Positive for fatigue and unexpected weight change.  Gastrointestinal: Positive for abdominal pain.  Neurological: Positive for numbness.  Psychiatric/Behavioral: Positive for sleep disturbance.  All other systems reviewed and are negative.    PAST MEDICAL/SURGICAL HISTORY:  Past Medical History:  Diagnosis Date  . Diabetes mellitus without complication ([place])   . Family history of prostate cancer   . Family history of stomach cancer   . Hypertension   . Pancreatic cancer ([place])   . Port-A-Cath in place 05/16/2019   Past Surgical History:  Procedure Laterality Date  . ABDOMINAL HYSTERECTOMY    . BACK SURGERY    . KIDNEY SURGERY     per pt, had leakage  . tubal ligation Left      SOCIAL HISTORY:  Social History   Socioeconomic History  . Marital status: Divorced    Spouse name: Not on file  . Number of children: 1  . Years of education: Not on file  . Highest education level: Not on file  Occupational History  . Occupation: disabled  Tobacco Use  . Smoking status: Current Every Day Smoker    Packs/day: 1.00    Years: 45.00    Pack years: 45.00    Types: Cigarettes  . Smokeless tobacco: Never Used  Substance and Sexual Activity  . Alcohol use: Not Currently  . Drug use: Never  . Sexual activity: Not on file  Other Topics Concern  . Not on file  Social History Narrative  . Not on file   Social Determinants of Health   Financial Resource Strain: Low Risk   . Difficulty of Paying Living Expenses: Not hard at all  Food Insecurity: No Food Insecurity  . Worried About Charity fundraiser in the Last Year: Never true  . Ran Out of Food in the Last Year: Never true  Transportation Needs: Unmet Transportation Needs  . Lack of Transportation (Medical): Yes  . Lack of Transportation (Non-Medical): Yes  Physical Activity: Inactive  . Days  of Exercise per Week: 0 days  . Minutes of Exercise per Session: 0 min  Stress: No Stress Concern Present  . Feeling of Stress : Not at all  Social Connections: Somewhat Isolated  . Frequency of Communication with Friends and Family: More than three times a week  . Frequency of Social Gatherings with Friends and Family: More than three times a week  . Attends Religious Services: 1 to 4 times per year  . Active Member of Clubs or Organizations: No  . Attends Archivist Meetings: Never  . Marital Status: Divorced  Human resources officer Violence: Not At Risk  . Fear of Current or Ex-Partner: No  . Emotionally Abused: No  . Physically Abused: No  . Sexually Abused: No    FAMILY HISTORY:  Family History  Problem Relation Age of Onset  . Diabetes Mother   . Hypertension Mother   . Lung cancer Mother 66       d. 73  . Diabetes Father   . Hypertension Father   . Heart disease Brother   . Stomach cancer Paternal Aunt 75  . Stroke Paternal Grandmother   . Prostate  cancer Other        PGMs brother  . Stomach cancer Other        PGMs mother    CURRENT MEDICATIONS:  Outpatient Encounter Medications as of 09/19/2019  Medication Sig  . amLODipine (NORVASC) 5 MG tablet Take 1 tablet (5 mg total) by mouth 2 (two) times daily.  Marland Kitchen gabapentin (NEURONTIN) 300 MG capsule TAKE 1 CAPSULE(300 MG) BY MOUTH THREE TIMES DAILY  . Gemcitabine HCl (GEMZAR IV) Inject into the vein. Days 1, 8, 15 q 28 days  . insulin aspart (NOVOLOG) 100 UNIT/ML injection Inject 20 Units into the skin 3 (three) times daily before meals.  . Lactulose 20 GM/30ML SOLN Take 30 mLs (20 g total) by mouth at bedtime as needed. (Patient not taking: Reported on 07/19/2019)  . lidocaine-prilocaine (EMLA) cream Apply small amount to port a cath site and cover with plastic wrap 1 hour prior to chemotherapy appointments (Patient not taking: Reported on 07/12/2019)  . linaclotide (LINZESS) 145 MCG CAPS capsule Take 1 capsule (145  mcg total) by mouth daily before breakfast.  . lisinopril (ZESTRIL) 40 MG tablet Take 1 tablet (40 mg total) by mouth daily.  . ondansetron (ZOFRAN) 4 MG tablet Take 1 tablet (4 mg total) by mouth 3 (three) times daily. (Patient not taking: Reported on 08/16/2019)  . Oxycodone HCl 10 MG TABS Take 1 tablet (10 mg total) by mouth every 6 (six) hours as needed.  Marland Kitchen PACLitaxel Protein-Bound Part (ABRAXANE IV) Inject into the vein. Days 1, 8, 15 q 28 days  . prochlorperazine (COMPAZINE) 10 MG tablet Take 1 tablet (10 mg total) by mouth every 6 (six) hours as needed (Nausea or vomiting). (Patient not taking: Reported on 07/12/2019)   No facility-administered encounter medications on file as of 09/19/2019.    ALLERGIES:  Allergies  Allergen Reactions  . Hydrocodone Hives  . Tramadol Itching     PHYSICAL EXAM:  ECOG Performance status: 1  Vitals:   09/19/19 0803  BP: (!) 159/85  Pulse: 81  Resp: 18  Temp: 97.9 F (36.6 C)  SpO2: 100%   Filed Weights   09/19/19 0803  Weight: 232 lb 6.4 oz (105.4 kg)    Physical Exam Vitals reviewed.  Constitutional:      Appearance: Normal appearance.  Cardiovascular:     Rate and Rhythm: Normal rate and regular rhythm.     Heart sounds: Normal heart sounds.  Pulmonary:     Effort: Pulmonary effort is normal.     Breath sounds: Normal breath sounds.  Abdominal:     General: There is no distension.     Palpations: Abdomen is soft. There is no mass.  Musculoskeletal:        General: No swelling.  Lymphadenopathy:     Cervical: No cervical adenopathy.  Skin:    General: Skin is warm.  Neurological:     General: No focal deficit present.     Mental Status: She is alert and oriented to person, place, and time.  Psychiatric:        Mood and Affect: Mood normal.        Behavior: Behavior normal.      LABORATORY DATA:  I have reviewed the labs as listed.  CBC    Component Value Date/Time   WBC 6.6 09/19/2019 0802   RBC 5.40 (H)  09/19/2019 0802   HGB 13.4 09/19/2019 0802   HCT 43.9 09/19/2019 0802   PLT 324 09/19/2019 0802   MCV 81.3  09/19/2019 0802   MCH 24.8 (L) 09/19/2019 0802   MCHC 30.5 09/19/2019 0802   RDW 19.0 (H) 09/19/2019 0802   LYMPHSABS 2.0 09/19/2019 0802   MONOABS 0.6 09/19/2019 0802   EOSABS 0.2 09/19/2019 0802   BASOSABS 0.0 09/19/2019 0802   CMP Latest Ref Rng & Units 09/19/2019 08/31/2019 08/16/2019  Glucose 70 - 99 mg/dL 258(H) 244(H) 199(H)  BUN 6 - 20 mg/dL _0 Creatinine 0.44 - 1.00 mg/dL 0.76 0.89 0.70  Sodium 135 - 145 mmol/L 137 136 138  Potassium 3.5 - 5.1 mmol/L 3.3(L) 4.0 3.9  Chloride 98 - 111 mmol/L 100 99 102  CO2 22 - 32 mmol/L _1 Calcium 8.9 - 10.3 mg/dL 9.0 8.5(L) 9.0  Total Protein 6.5 - 8.1 g/dL 6.8 6.3(L) 6.8  Total Bilirubin 0.3 - 1.2 mg/dL 0.4 0.4 0.5  Alkaline Phos 38 - 126 U/L 78 75 76  AST 15 - 41 U/L _2 ALT 0 - 44 U/L _3 DIAGNOSTIC IMAGING:  I have independently reviewed the scans and discussed with the patient.   I have reviewed Venita Lick LPN's note and agree with the documentation.  I personally performed a face-to-face visit, made revisions and my assessment and plan is as follows.    ASSESSMENT & PLAN:   Pancreatic cancer metastasized to liver ([place]) 1.  Metastatic pancreatic cancer to the liver: -Biopsy of the pancreatic tail mass on 01/05/2019, moderately to poorly differentiated adenocarcinoma. -Foundation 1 testing shows MS-stable, K-ras G 12 V, CDK 6 amplification, ARID1A - 3 cycles of FOLFIRINOX from 02/25/2019 through 03/28/2019. -CTAP on 04/04/2019 shows pancreatic mass measuring 3.3 x 4.7 x 2.9 cm, hypodense lesions in the liver, largest 3.2 cm in the right hepatic lobe and 1.4 cm lesion adjacent to the gallbladder fossa.  There is a 3.3 cm hypodense lesion adjacent to the falciform ligament.  Scan was compared to prior scan from 10/21/2018. - She was thought to have progression in chemotherapy was switched to  gemcitabine and Abraxane.  She received 2 doses on 03/14/2019 and 03/28/2019 in [place]. -I do not believe she had a true progression on FOLFIRINOX. -4 cycles of gemcitabine and Abraxane day 1 and day 15 on 05/17/2019 through 08/16/2019. -CT CAP on 08/29/2019 showed no suspicious liver lesions.  Pancreatic mass measures 4.1 x 2.8 cm in the tail.  No dilatation of the main pancreatic duct.  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  Scattered tiny bilateral pulmonary nodules too small to characterize. -CA 19-has always been normal. -She presented today with to the clinic with severe epigastric and abdominal pain which started this morning.  She reports it as aching type.  She is very tearful.  She also had 11 pound weight loss and reports decreased appetite. -She denied any associated nausea or vomiting.  She had a bowel movement which was regular this morning. -I have recommended her to be immediately evaluated in the ER for urgent CT scan of the abdomen and pelvis.  We have also ordered amylase and lipase levels.  I have given 2 mg of morphine IV to alleviate the pain.  2.  Abdominal pain: -She is reportedly taking oxycodone 10 mg as needed provided by her PMD.  3.  Constipation: -She will continue lactulose which is helping.  She stopped taking Linzess.  4.  Peripheral neuropathy: -She will continue gabapentin 3 times a  day.  Neuropathic pain has improved but she still has numbness.  Addendum: -I have reviewed CT scan from the ER which showed stable appearance of pancreatic mass with occlusion of the splenic vein.  Stable appearance of small hepatic metastasis which is unchanged from the previous scan.  Potential sigmoid colon wall thickening suspicious for mild colitis versus underdistention. -If the pain persists, we might consider anticoagulation and see if that improves.    Orders placed this encounter:  Orders Placed This Encounter   Procedures  . Amylase  . Lipase      Derek Jack, MD [place] [phone]

## 2019-09-19 NOTE — ED Notes (Signed)
EDP made aware of pt leaving AMA

## 2019-09-19 NOTE — Progress Notes (Signed)
0835 Labs reviewed with and pt seen by Dr. Delton Coombes and pt to be given Morphine 2 mg IV and sent to ER for pain management per MD                                                                       0845 Report given to ER nurse                                                      0900 Pt given Morphine 2 mg IV per MD order.                              0910 Pt transported to ER via wheelchair by NT in stable condition. Port left accessed,saline locked and flushed for use in ER.

## 2019-09-19 NOTE — Assessment & Plan Note (Addendum)
1.  Metastatic pancreatic cancer to the liver: -Biopsy of the pancreatic tail mass on 01/05/2019, moderately to poorly differentiated adenocarcinoma. -Foundation 1 testing shows MS-stable, K-ras G 12 V, CDK 6 amplification, ARID1A - 3 cycles of FOLFIRINOX from 02/25/2019 through 03/28/2019. -CTAP on 04/04/2019 shows pancreatic mass measuring 3.3 x 4.7 x 2.9 cm, hypodense lesions in the liver, largest 3.2 cm in the right hepatic lobe and 1.4 cm lesion adjacent to the gallbladder fossa.  There is a 3.3 cm hypodense lesion adjacent to the falciform ligament.  Scan was compared to prior scan from 10/21/2018. - She was thought to have progression in chemotherapy was switched to gemcitabine and Abraxane.  She received 2 doses on 03/14/2019 and 03/28/2019 in [place]. -I do not believe she had a true progression on FOLFIRINOX. -4 cycles of gemcitabine and Abraxane day 1 and day 15 on 05/17/2019 through 08/16/2019. -CT CAP on 08/29/2019 showed no suspicious liver lesions.  Pancreatic mass measures 4.1 x 2.8 cm in the tail.  No dilatation of the main pancreatic duct.  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  Scattered tiny bilateral pulmonary nodules too small to characterize. -CA 19-has always been normal. -She presented today with to the clinic with severe epigastric and abdominal pain which started this morning.  She reports it as aching type.  She is very tearful.  She also had 11 pound weight loss and reports decreased appetite. -She denied any associated nausea or vomiting.  She had a bowel movement which was regular this morning. -I have recommended her to be immediately evaluated in the ER for urgent CT scan of the abdomen and pelvis.  We have also ordered amylase and lipase levels.  I have given 2 mg of morphine IV to alleviate the pain.  2.  Abdominal pain: -She is reportedly taking oxycodone 10 mg as needed provided by her PMD.  3.  Constipation: -She  will continue lactulose which is helping.  She stopped taking Linzess.  4.  Peripheral neuropathy: -She will continue gabapentin 3 times a day.  Neuropathic pain has improved but she still has numbness.

## 2019-09-21 ENCOUNTER — Ambulatory Visit (HOSPITAL_COMMUNITY): Payer: Medicare HMO | Admitting: Hematology

## 2019-09-21 ENCOUNTER — Ambulatory Visit (HOSPITAL_COMMUNITY): Payer: Medicare HMO

## 2019-09-21 ENCOUNTER — Other Ambulatory Visit (HOSPITAL_COMMUNITY): Payer: Medicare HMO

## 2019-09-27 ENCOUNTER — Inpatient Hospital Stay (HOSPITAL_BASED_OUTPATIENT_CLINIC_OR_DEPARTMENT_OTHER): Payer: Medicare HMO | Admitting: Hematology

## 2019-09-27 ENCOUNTER — Inpatient Hospital Stay (HOSPITAL_COMMUNITY): Payer: Medicare HMO

## 2019-09-27 ENCOUNTER — Other Ambulatory Visit: Payer: Self-pay

## 2019-09-27 ENCOUNTER — Other Ambulatory Visit (HOSPITAL_COMMUNITY): Payer: Self-pay | Admitting: Hematology

## 2019-09-27 VITALS — BP 146/72 | HR 79 | Temp 96.9°F | Resp 18

## 2019-09-27 DIAGNOSIS — C259 Malignant neoplasm of pancreas, unspecified: Secondary | ICD-10-CM

## 2019-09-27 DIAGNOSIS — C787 Secondary malignant neoplasm of liver and intrahepatic bile duct: Secondary | ICD-10-CM | POA: Diagnosis not present

## 2019-09-27 DIAGNOSIS — Z5111 Encounter for antineoplastic chemotherapy: Secondary | ICD-10-CM | POA: Diagnosis not present

## 2019-09-27 DIAGNOSIS — Z95828 Presence of other vascular implants and grafts: Secondary | ICD-10-CM

## 2019-09-27 LAB — COMPREHENSIVE METABOLIC PANEL
ALT: 20 U/L (ref 0–44)
AST: 20 U/L (ref 15–41)
Albumin: 3.5 g/dL (ref 3.5–5.0)
Alkaline Phosphatase: 73 U/L (ref 38–126)
Anion gap: 11 (ref 5–15)
BUN: 8 mg/dL (ref 6–20)
CO2: 28 mmol/L (ref 22–32)
Calcium: 9 mg/dL (ref 8.9–10.3)
Chloride: 98 mmol/L (ref 98–111)
Creatinine, Ser: 0.65 mg/dL (ref 0.44–1.00)
GFR calc Af Amer: >60 mL/min (ref >60)
GFR calc non Af Amer: >60 mL/min (ref >60)
Glucose, Bld: 285 mg/dL — ABNORMAL HIGH (ref 70–99)
Potassium: 3.9 mmol/L (ref 3.5–5.1)
Sodium: 137 mmol/L (ref 135–145)
Total Bilirubin: 0.3 mg/dL (ref 0.3–1.2)
Total Protein: 6.6 g/dL (ref 6.5–8.1)

## 2019-09-27 LAB — CBC WITH DIFFERENTIAL/PLATELET
Abs Immature Granulocytes: 0.01 10*3/uL (ref 0.00–0.07)
Basophils Absolute: 0 10*3/uL (ref 0.0–0.1)
Basophils Relative: 0 %
Eosinophils Absolute: 0.2 10*3/uL (ref 0.0–0.5)
Eosinophils Relative: 4 %
HCT: 43.5 % (ref 36.0–46.0)
Hemoglobin: 13 g/dL (ref 12.0–15.0)
Immature Granulocytes: 0 %
Lymphocytes Relative: 32 %
Lymphs Abs: 1.8 10*3/uL (ref 0.7–4.0)
MCH: 24.7 pg — ABNORMAL LOW (ref 26.0–34.0)
MCHC: 29.9 g/dL — ABNORMAL LOW (ref 30.0–36.0)
MCV: 82.5 fL (ref 80.0–100.0)
Monocytes Absolute: 0.6 10*3/uL (ref 0.1–1.0)
Monocytes Relative: 11 %
Neutro Abs: 2.9 10*3/uL (ref 1.7–7.7)
Neutrophils Relative %: 53 %
Platelets: 180 10*3/uL (ref 150–400)
RBC: 5.27 MIL/uL — ABNORMAL HIGH (ref 3.87–5.11)
RDW: 18.5 % — ABNORMAL HIGH (ref 11.5–15.5)
WBC: 5.5 10*3/uL (ref 4.0–10.5)
nRBC: 0 % (ref 0.0–0.2)

## 2019-09-27 MED ORDER — SODIUM CHLORIDE 0.9 % IV SOLN
Freq: Once | INTRAVENOUS | Status: AC
Start: 2019-09-27 — End: 2019-09-27
  Filled 2019-09-27: qty 4

## 2019-09-27 MED ORDER — "BD DISP NEEDLES 30G X 1/2"" MISC": 6 refills | Status: DC

## 2019-09-27 MED ORDER — SODIUM CHLORIDE 0.9 % IV SOLN
800.0000 mg/m2 | Freq: Once | INTRAVENOUS | Status: AC
  Administered 2019-09-27: 13:00:00 1786 mg via INTRAVENOUS
  Filled 2019-09-27: qty 20.67

## 2019-09-27 MED ORDER — HEPARIN SOD (PORK) LOCK FLUSH 100 UNIT/ML IV SOLN
500.0000 [IU] | Freq: Once | INTRAVENOUS | Status: AC | PRN
  Administered 2019-09-27: 500 [IU]

## 2019-09-27 MED ORDER — SODIUM CHLORIDE 0.9 % IV SOLN: Freq: Once | INTRAVENOUS | Status: AC

## 2019-09-27 MED ORDER — PACLITAXEL PROTEIN-BOUND CHEMO INJECTION 100 MG
75.0000 mg/m2 | Freq: Once | INTRAVENOUS | Status: AC
  Administered 2019-09-27: 175 mg via INTRAVENOUS
  Filled 2019-09-27: qty 35

## 2019-09-27 MED ORDER — SODIUM CHLORIDE 0.9% FLUSH
10.0000 mL | INTRAVENOUS | Status: DC | PRN
  Administered 2019-09-27: 10 mL

## 2019-09-27 NOTE — Assessment & Plan Note (Signed)
1.  Metastatic pancreatic cancer to the liver: -Biopsy of the pancreatic tail mass on 01/05/2019, moderately to poorly differentiated adenocarcinoma. -Foundation 1 testing shows MS-stable, K-ras G 12 V, CDK 6 amplification, ARID1A - 3 cycles of FOLFIRINOX from 02/25/2019 through 03/28/2019. -CTAP on 04/04/2019 shows pancreatic mass measuring 3.3 x 4.7 x 2.9 cm, hypodense lesions in the liver, largest 3.2 cm in the right hepatic lobe and 1.4 cm lesion adjacent to the gallbladder fossa.  There is a 3.3 cm hypodense lesion adjacent to the falciform ligament.  Scan was compared to prior scan from 10/21/2018. - She was thought to have progression in chemotherapy was switched to gemcitabine and Abraxane.  She received 2 doses on 03/14/2019 and 03/28/2019 in [place]. -I do not believe she had a true progression on FOLFIRINOX. -4 cycles of gemcitabine and Abraxane day 1 and day 15 on 05/17/2019 through 08/16/2019. -CT CAP on 08/29/2019 showed no suspicious liver lesions.  Pancreatic mass measures 4.1 x 2.8 cm in the tail.  No dilatation of the main pancreatic duct.  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  Scattered tiny bilateral pulmonary nodules too small to characterize. -CA 19-has always been normal. -Labs are acceptable to proceed with treatment today.  She will proceed with cycle 5 gemcitabine and Abraxane. -She will return to clinic in 4 weeks.  2.  Abdominal pain: -She is reportedly taking oxycodone 10 mg as needed provided by her PMD. -Amylase and lipase normal.  Recent CT abdomen on 09/19/2019 did not reveal any significant findings to suggest probable colitis.  Abdominal pain has nearly resolved.  3.  Constipation: -She will continue lactulose which is helping.  She stopped taking Linzess.  4.  Peripheral neuropathy: -She will continue gabapentin 3 times a day.  Neuropathic pain has improved but she still has numbness.

## 2019-09-27 NOTE — Progress Notes (Signed)
Patient presents today for treatment and follow up visit with RNester NP. Labs reviewed by NP. Verbal orders received to proceed with treatment. MAR reviewed.   Treatment given today per MD orders. Tolerated infusion without adverse affects. Vital signs stable. No complaints at this time. Discharged from clinic ambulatory. F/U with [facility] as scheduled.

## 2019-09-27 NOTE — Progress Notes (Signed)
[address] [place], [place] [address]   CLINIC:  Medical Oncology/Hematology  PCP:  Patient, No Pcp Per No address on file None   REASON FOR VISIT:  Follow-up for pancreatic cancer   CURRENT THERAPY: Abraxane gemcitabine  BRIEF ONCOLOGIC HISTORY:  Oncology History  Pancreatic cancer metastasized to liver ([place])  05/09/2019 Initial Diagnosis   Pancreatic cancer metastasized to liver ([place])   05/17/2019 -  Chemotherapy   The patient had PACLitaxel-protein bound (ABRAXANE) chemo infusion 275 mg, 125 mg/m2 = 275 mg, Intravenous,  Once, 5 of 5 cycles Dose modification: 100 mg/m2 (80 % of original dose 125 mg/m2, Cycle 1, Reason: Other (see comments), Comment: neutropenia), 93.75 mg/m2 (75 % of original dose 125 mg/m2, Cycle 1, Reason: Other (see comments), Comment: neutropenia), 100 mg/m2 (80 % of original dose 125 mg/m2, Cycle 2, Reason: Other (see comments), Comment: cytopenias with previous treatment), 75 mg/m2 (original dose 125 mg/m2, Cycle 3, Reason: Provider Judgment, Comment: neuropathy stated as 10/10 dose reduce required per NP Reynolds Bowl) Administration: 275 mg (05/17/2019), 225 mg (05/24/2019), 200 mg (05/31/2019), 225 mg (06/14/2019), 225 mg (06/28/2019), 225 mg (07/12/2019), 175 mg (07/19/2019), 175 mg (08/16/2019), 175 mg (08/31/2019), 175 mg (09/27/2019) ondansetron (ZOFRAN) 8 mg in sodium chloride 0.9 % 50 mL IVPB, 8 mg (100 % of original dose 8 mg), Intravenous,  Once, 1 of 1 cycle Dose modification: 8 mg (original dose 8 mg, Cycle 1) gemcitabine (GEMZAR) 2,200 mg in sodium chloride 0.9 % 250 mL chemo infusion, 2,204 mg, Intravenous,  Once, 5 of 5 cycles Dose modification: 750 mg/m2 (75 % of original dose 1,000 mg/m2, Cycle 1, Reason: Other (see comments), Comment: neutropenia), 750 mg/m2 (75 % of original dose 1,000 mg/m2, Cycle 1, Reason: Other (see comments), Comment: neutropenia), 800 mg/m2 (80 % of original dose 1,000 mg/m2, Cycle 2, Reason: Other  (see comments), Comment: cytopenia) Administration: 2,200 mg (05/17/2019), 1,600 mg (05/24/2019), 1,600 mg (05/31/2019), 1,786 mg (06/14/2019), 1,786 mg (06/28/2019), 1,786 mg (07/12/2019), 1,786 mg (07/19/2019), 1,786 mg (08/16/2019), 1,786 mg (08/31/2019), 1,786 mg (09/27/2019) ondansetron (ZOFRAN) 8 mg, dexamethasone (DECADRON) 10 mg in sodium chloride 0.9 % 50 mL IVPB, , Intravenous,  Once, 5 of 5 cycles Administration:  (05/17/2019),  (05/24/2019),  (05/31/2019),  (06/14/2019),  (06/28/2019),  (07/12/2019),  (07/19/2019),  (08/16/2019),  (08/31/2019),  (09/27/2019)  for chemotherapy treatment.    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  The Common Hereditary Gene Panel offered by [facility] includes sequencing and/or deletion duplication testing of the following 48 genes: APC, ATM, AXIN2, BARD1, BMPR1A, BRCA1, BRCA2, BRIP1, CDH1, CDK4, CDKN2A (p14ARF), CDKN2A (p16INK4a), CHEK2, CTNNA1, DICER1, EPCAM (Deletion/duplication testing only), GREM1 (promoter region deletion/duplication testing only), KIT, MEN1, MLH1, MSH2, MSH3, MSH6, MUTYH, NBN, NF1, NHTL1, PALB2, PDGFRA, PMS2, POLD1, POLE, PTEN, RAD50, RAD51C, RAD51D, RNF43, SDHB, SDHC, SDHD, SMAD4, SMARCA4. STK11, TP53, TSC1, TSC2, and VHL.  The following genes were evaluated for sequence changes only: SDHA and HOXB13 c.251G>A variant only. The report date is August 02, 2019.        INTERVAL HISTORY:  Ms. Patti 58 y.o. female presents today for follow-up.  She reports overall doing well.  She was recently seen in the emergency room on September 19, 2019 for abdominal pains.  CT of abdomen did not show any significant findings but suggested possible colitis.  She reports abdominal pain has nearly resolved.  She denies any change in bowel habits.  Appetite is  stable.  No new pains.  She states she is ready to proceed with treatment today.    REVIEW OF SYSTEMS:  Review of Systems    Constitutional: Positive for fatigue.  HENT:  Negative.   Eyes: Negative.   Respiratory: Negative.   Cardiovascular: Negative.   Gastrointestinal: Positive for abdominal pain.  Endocrine: Negative.   Genitourinary: Negative.    Musculoskeletal: Negative.   Skin: Negative.   Neurological: Negative.   Hematological: Negative.   Psychiatric/Behavioral: Negative.      PAST MEDICAL/SURGICAL HISTORY:  Past Medical History:  Diagnosis Date  . Diabetes mellitus without complication ([facility])   . Family history of prostate cancer   . Family history of stomach cancer   . Hypertension   . Pancreatic cancer ([facility])   . Port-A-Cath in place 05/16/2019   Past Surgical History:  Procedure Laterality Date  . ABDOMINAL HYSTERECTOMY    . BACK SURGERY    . KIDNEY SURGERY     per pt, had leakage  . tubal ligation Left      SOCIAL HISTORY:  Social History   Socioeconomic History  . Marital status: Divorced    Spouse name: Not on file  . Number of children: 1  . Years of education: Not on file  . Highest education level: Not on file  Occupational History  . Occupation: disabled  Tobacco Use  . Smoking status: Current Every Day Smoker    Packs/day: 1.00    Years: 45.00    Pack years: 45.00    Types: Cigarettes  . Smokeless tobacco: Never Used  Substance and Sexual Activity  . Alcohol use: Not Currently  . Drug use: Never  . Sexual activity: Not on file  Other Topics Concern  . Not on file  Social History Narrative  . Not on file   Social Determinants of Health   Financial Resource Strain: Low Risk   . Difficulty of Paying Living Expenses: Not hard at all  Food Insecurity: No Food Insecurity  . Worried About Charity fundraiser in the Last Year: Never true  . Ran Out of Food in the Last Year: Never true  Transportation Needs: Unmet Transportation Needs  . Lack of Transportation (Medical): Yes  . Lack of Transportation (Non-Medical): Yes  Physical Activity: Inactive  .  Days of Exercise per Week: 0 days  . Minutes of Exercise per Session: 0 min  Stress: No Stress Concern Present  . Feeling of Stress : Not at all  Social Connections: Somewhat Isolated  . Frequency of Communication with Friends and Family: More than three times a week  . Frequency of Social Gatherings with Friends and Family: More than three times a week  . Attends Religious Services: 1 to 4 times per year  . Active Member of Clubs or Organizations: No  . Attends Archivist Meetings: Never  . Marital Status: Divorced  Human resources officer Violence: Not At Risk  . Fear of Current or Ex-Partner: No  . Emotionally Abused: No  . Physically Abused: No  . Sexually Abused: No    FAMILY HISTORY:  Family History  Problem Relation Age of Onset  . Diabetes Mother   . Hypertension Mother   . Lung cancer Mother 3       d. 78  . Diabetes Father   . Hypertension Father   . Heart disease Brother   . Stomach cancer Paternal Aunt 26  . Stroke Paternal Grandmother   . Prostate cancer Other  PGMs brother  . Stomach cancer Other        PGMs mother    CURRENT MEDICATIONS:  Outpatient Encounter Medications as of 09/27/2019  Medication Sig  . amLODipine (NORVASC) 5 MG tablet Take 1 tablet (5 mg total) by mouth 2 (two) times daily.  Marland Kitchen gabapentin (NEURONTIN) 300 MG capsule TAKE 1 CAPSULE(300 MG) BY MOUTH THREE TIMES DAILY  . Gemcitabine HCl (GEMZAR IV) Inject into the vein. Days 1, 8, 15 q 28 days  . insulin aspart (NOVOLOG) 100 UNIT/ML injection Inject 20 Units into the skin 3 (three) times daily before meals.  Marland Kitchen linaclotide (LINZESS) 145 MCG CAPS capsule Take 1 capsule (145 mcg total) by mouth daily before breakfast.  . lisinopril (ZESTRIL) 40 MG tablet Take 1 tablet (40 mg total) by mouth daily.  . Oxycodone HCl 10 MG TABS Take 1 tablet (10 mg total) by mouth every 6 (six) hours as needed.  Marland Kitchen oxyCODONE-acetaminophen (PERCOCET) 10-325 MG tablet Take 2 tablets by mouth daily.  Marland Kitchen  PACLitaxel Protein-Bound Part (ABRAXANE IV) Inject into the vein. Days 1, 8, 15 q 28 days  . Lactulose 20 GM/30ML SOLN Take 30 mLs (20 g total) by mouth at bedtime as needed. (Patient not taking: Reported on 07/19/2019)  . lidocaine-prilocaine (EMLA) cream Apply small amount to port a cath site and cover with plastic wrap 1 hour prior to chemotherapy appointments (Patient not taking: Reported on 07/12/2019)  . ondansetron (ZOFRAN) 4 MG tablet Take 1 tablet (4 mg total) by mouth 3 (three) times daily. (Patient not taking: Reported on 08/16/2019)  . prochlorperazine (COMPAZINE) 10 MG tablet Take 1 tablet (10 mg total) by mouth every 6 (six) hours as needed (Nausea or vomiting). (Patient not taking: Reported on 07/12/2019)   No facility-administered encounter medications on file as of 09/27/2019.    ALLERGIES:  Allergies  Allergen Reactions  . Hydrocodone Hives  . Tramadol Itching     PHYSICAL EXAM:  ECOG Performance status: 1  There were no vitals filed for this visit. There were no vitals filed for this visit.  Physical Exam Constitutional:      Appearance: Normal appearance.  HENT:     Head: Normocephalic.     Right Ear: External ear normal.     Left Ear: External ear normal.     Nose: Nose normal.     Mouth/Throat:     Pharynx: Oropharynx is clear.  Eyes:     Conjunctiva/sclera: Conjunctivae normal.  Cardiovascular:     Rate and Rhythm: Normal rate and regular rhythm.     Pulses: Normal pulses.     Heart sounds: Normal heart sounds.  Pulmonary:     Effort: Pulmonary effort is normal.  Abdominal:     General: Bowel sounds are normal.  Musculoskeletal:        General: Normal range of motion.     Cervical back: Normal range of motion.  Skin:    General: Skin is warm.  Neurological:     General: No focal deficit present.     Mental Status: She is alert and oriented to person, place, and time.  Psychiatric:        Mood and Affect: Mood normal.        Behavior:  Behavior normal.      LABORATORY DATA:  I have reviewed the labs as listed.  CBC    Component Value Date/Time   WBC 5.5 09/27/2019 0932   RBC 5.27 (H) 09/27/2019 0932   HGB 13.0  09/27/2019 0932   HCT 43.5 09/27/2019 0932   PLT 180 09/27/2019 0932   MCV 82.5 09/27/2019 0932   MCH 24.7 (L) 09/27/2019 0932   MCHC 29.9 (L) 09/27/2019 0932   RDW 18.5 (H) 09/27/2019 0932   LYMPHSABS 1.8 09/27/2019 0932   MONOABS 0.6 09/27/2019 0932   EOSABS 0.2 09/27/2019 0932   BASOSABS 0.0 09/27/2019 0932   CMP Latest Ref Rng & Units 09/27/2019 09/19/2019 08/31/2019  Glucose 70 - 99 mg/dL 285(H) 258(H) 244(H)  BUN 6 - 20 mg/dL _0 Creatinine 0.44 - 1.00 mg/dL 0.65 0.76 0.89  Sodium 135 - 145 mmol/L 137 137 136  Potassium 3.5 - 5.1 mmol/L 3.9 3.3(L) 4.0  Chloride 98 - 111 mmol/L 98 100 99  CO2 22 - 32 mmol/L _1 Calcium 8.9 - 10.3 mg/dL 9.0 9.0 8.5(L)  Total Protein 6.5 - 8.1 g/dL 6.6 6.8 6.3(L)  Total Bilirubin 0.3 - 1.2 mg/dL 0.3 0.4 0.4  Alkaline Phos 38 - 126 U/L 73 78 75  AST 15 - 41 U/L _2 ALT 0 - 44 U/L _3 ASSESSMENT & PLAN:   Pancreatic cancer metastasized to liver ([facility]) 1.  Metastatic pancreatic cancer to the liver: -Biopsy of the pancreatic tail mass on 01/05/2019, moderately to poorly differentiated adenocarcinoma. -Foundation 1 testing shows MS-stable, K-ras G 12 V, CDK 6 amplification, ARID1A - 3 cycles of FOLFIRINOX from 02/25/2019 through 03/28/2019. -CTAP on 04/04/2019 shows pancreatic mass measuring 3.3 x 4.7 x 2.9 cm, hypodense lesions in the liver, largest 3.2 cm in the right hepatic lobe and 1.4 cm lesion adjacent to the gallbladder fossa.  There is a 3.3 cm hypodense lesion adjacent to the falciform ligament.  Scan was compared to prior scan from 10/21/2018. - She was thought to have progression in chemotherapy was switched to gemcitabine and Abraxane.  She received 2 doses on 03/14/2019 and 03/28/2019 in [place]. -I do not  believe she had a true progression on FOLFIRINOX. -4 cycles of gemcitabine and Abraxane day 1 and day 15 on 05/17/2019 through 08/16/2019. -CT CAP on 08/29/2019 showed no suspicious liver lesions.  Pancreatic mass measures 4.1 x 2.8 cm in the tail.  No dilatation of the main pancreatic duct.  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  Scattered tiny bilateral pulmonary nodules too small to characterize. -CA 19-has always been normal. -Labs are acceptable to proceed with treatment today.  She will proceed with cycle 5 gemcitabine and Abraxane. -She will return to clinic in 4 weeks.  2.  Abdominal pain: -She is reportedly taking oxycodone 10 mg as needed provided by her PMD. -Amylase and lipase normal.  Recent CT abdomen on 09/19/2019 did not reveal any significant findings to suggest probable colitis.  Abdominal pain has nearly resolved.  3.  Constipation: -She will continue lactulose which is helping.  She stopped taking Linzess.  4.  Peripheral neuropathy: -She will continue gabapentin 3 times a day.  Neuropathic pain has improved but she still has numbness.       [place] [phone]

## 2019-09-27 NOTE — Patient Instructions (Signed)
[facility] Discharge Instructions for Patients Receiving Chemotherapy  Today you received the following chemotherapy agents   To help prevent nausea and vomiting after your treatment, we encourage you to take your nausea medication   If you develop nausea and vomiting that is not controlled by your nausea medication, call the clinic.   BELOW ARE SYMPTOMS THAT SHOULD BE REPORTED IMMEDIATELY:  *FEVER GREATER THAN 100.5 F  *CHILLS WITH OR WITHOUT FEVER  NAUSEA AND VOMITING THAT IS NOT CONTROLLED WITH YOUR NAUSEA MEDICATION  *UNUSUAL SHORTNESS OF BREATH  *UNUSUAL BRUISING OR BLEEDING  TENDERNESS IN MOUTH AND THROAT WITH OR WITHOUT PRESENCE OF ULCERS  *URINARY PROBLEMS  *BOWEL PROBLEMS  UNUSUAL RASH Items with * indicate a potential emergency and should be followed up as soon as possible.  Feel free to call the clinic should you have any questions or concerns. The clinic phone number is [phone].  Please show the CHEMO ALERT CARD at check-in to the Emergency Department and triage nurse.   

## 2019-09-29 ENCOUNTER — Other Ambulatory Visit (HOSPITAL_COMMUNITY): Payer: Self-pay | Admitting: *Deleted

## 2019-09-29 ENCOUNTER — Ambulatory Visit (HOSPITAL_COMMUNITY): Payer: Medicare HMO

## 2019-09-29 ENCOUNTER — Other Ambulatory Visit (HOSPITAL_COMMUNITY): Payer: Medicare HMO

## 2019-09-29 ENCOUNTER — Ambulatory Visit (HOSPITAL_COMMUNITY): Payer: Medicare HMO | Admitting: Hematology

## 2019-09-29 MED ORDER — "BD DISP NEEDLES 30G X 1/2"" MISC": 6 refills | Status: DC

## 2019-10-11 ENCOUNTER — Inpatient Hospital Stay (HOSPITAL_COMMUNITY): Payer: Medicare HMO

## 2019-10-11 ENCOUNTER — Inpatient Hospital Stay (HOSPITAL_COMMUNITY): Payer: Medicare HMO | Attending: Hematology

## 2019-10-11 ENCOUNTER — Encounter (HOSPITAL_COMMUNITY): Payer: Self-pay

## 2019-10-11 ENCOUNTER — Other Ambulatory Visit: Payer: Self-pay

## 2019-10-11 VITALS — BP 157/86 | HR 72 | Temp 97.4°F | Resp 18 | Wt 232.4 lb

## 2019-10-11 DIAGNOSIS — C259 Malignant neoplasm of pancreas, unspecified: Secondary | ICD-10-CM

## 2019-10-11 DIAGNOSIS — G629 Polyneuropathy, unspecified: Secondary | ICD-10-CM | POA: Diagnosis not present

## 2019-10-11 DIAGNOSIS — C787 Secondary malignant neoplasm of liver and intrahepatic bile duct: Secondary | ICD-10-CM | POA: Diagnosis present

## 2019-10-11 DIAGNOSIS — C252 Malignant neoplasm of tail of pancreas: Secondary | ICD-10-CM | POA: Diagnosis present

## 2019-10-11 DIAGNOSIS — Z79899 Other long term (current) drug therapy: Secondary | ICD-10-CM | POA: Insufficient documentation

## 2019-10-11 DIAGNOSIS — Z5111 Encounter for antineoplastic chemotherapy: Secondary | ICD-10-CM | POA: Diagnosis present

## 2019-10-11 DIAGNOSIS — Z95828 Presence of other vascular implants and grafts: Secondary | ICD-10-CM

## 2019-10-11 LAB — COMPREHENSIVE METABOLIC PANEL
ALT: 24 U/L (ref 0–44)
AST: 30 U/L (ref 15–41)
Albumin: 3.9 g/dL (ref 3.5–5.0)
Alkaline Phosphatase: 75 U/L (ref 38–126)
Anion gap: 9 (ref 5–15)
BUN: 12 mg/dL (ref 6–20)
CO2: 25 mmol/L (ref 22–32)
Calcium: 9.1 mg/dL (ref 8.9–10.3)
Chloride: 100 mmol/L (ref 98–111)
Creatinine, Ser: 0.77 mg/dL (ref 0.44–1.00)
GFR calc Af Amer: >60 mL/min (ref >60)
GFR calc non Af Amer: >60 mL/min (ref >60)
Glucose, Bld: 246 mg/dL — ABNORMAL HIGH (ref 70–99)
Potassium: 3.6 mmol/L (ref 3.5–5.1)
Sodium: 134 mmol/L — ABNORMAL LOW (ref 135–145)
Total Bilirubin: 0.5 mg/dL (ref 0.3–1.2)
Total Protein: 7.2 g/dL (ref 6.5–8.1)

## 2019-10-11 LAB — CBC WITH DIFFERENTIAL/PLATELET
Abs Immature Granulocytes: 0.02 K/uL (ref 0.00–0.07)
Basophils Absolute: 0 K/uL (ref 0.0–0.1)
Basophils Relative: 0 %
Eosinophils Absolute: 0.1 K/uL (ref 0.0–0.5)
Eosinophils Relative: 2 %
HCT: 45.5 % (ref 36.0–46.0)
Hemoglobin: 14 g/dL (ref 12.0–15.0)
Immature Granulocytes: 0 %
Lymphocytes Relative: 27 %
Lymphs Abs: 1.4 K/uL (ref 0.7–4.0)
MCH: 24.2 pg — ABNORMAL LOW (ref 26.0–34.0)
MCHC: 30.8 g/dL (ref 30.0–36.0)
MCV: 78.7 fL — ABNORMAL LOW (ref 80.0–100.0)
Monocytes Absolute: 0.5 K/uL (ref 0.1–1.0)
Monocytes Relative: 9 %
Neutro Abs: 3.2 K/uL (ref 1.7–7.7)
Neutrophils Relative %: 62 %
Platelets: 246 K/uL (ref 150–400)
RBC: 5.78 MIL/uL — ABNORMAL HIGH (ref 3.87–5.11)
RDW: 19 % — ABNORMAL HIGH (ref 11.5–15.5)
WBC: 5.2 K/uL (ref 4.0–10.5)
nRBC: 0 % (ref 0.0–0.2)

## 2019-10-11 MED ORDER — HEPARIN SOD (PORK) LOCK FLUSH 100 UNIT/ML IV SOLN
500.0000 [IU] | Freq: Once | INTRAVENOUS | Status: AC | PRN
  Administered 2019-10-11: 500 [IU]

## 2019-10-11 MED ORDER — SODIUM CHLORIDE 0.9 % IV SOLN
Freq: Once | INTRAVENOUS | Status: AC
  Filled 2019-10-11: qty 4

## 2019-10-11 MED ORDER — SODIUM CHLORIDE 0.9 % IV SOLN: Freq: Once | INTRAVENOUS | Status: AC

## 2019-10-11 MED ORDER — SODIUM CHLORIDE 0.9% FLUSH
10.0000 mL | INTRAVENOUS | Status: DC | PRN
  Administered 2019-10-11: 14:00:00 10 mL

## 2019-10-11 MED ORDER — PACLITAXEL PROTEIN-BOUND CHEMO INJECTION 100 MG
75.0000 mg/m2 | Freq: Once | INTRAVENOUS | Status: AC
  Administered 2019-10-11: 175 mg via INTRAVENOUS
  Filled 2019-10-11: qty 35

## 2019-10-11 MED ORDER — SODIUM CHLORIDE 0.9 % IV SOLN
800.0000 mg/m2 | Freq: Once | INTRAVENOUS | Status: AC
  Administered 2019-10-11: 13:00:00 1786 mg via INTRAVENOUS
  Filled 2019-10-11: qty 26.3

## 2019-10-11 NOTE — Progress Notes (Signed)
Patient presents today for treatment. Labs reviewed by RNester NP. Proceed with treatment orders received. Patient has no complaints of any changes since her last visit. Vital signs within parameters for treatment.   Treatment given today per MD orders. Tolerated infusion without adverse affects. Vital signs stable. No complaints at this time. Discharged from clinic ambulatory. F/U with [facility] as scheduled.

## 2019-10-11 NOTE — Patient Instructions (Signed)
[facility] Discharge Instructions for Patients Receiving Chemotherapy  Today you received the following chemotherapy agents   To help prevent nausea and vomiting after your treatment, we encourage you to take your nausea medication   If you develop nausea and vomiting that is not controlled by your nausea medication, call the clinic.   BELOW ARE SYMPTOMS THAT SHOULD BE REPORTED IMMEDIATELY:  *FEVER GREATER THAN 100.5 F  *CHILLS WITH OR WITHOUT FEVER  NAUSEA AND VOMITING THAT IS NOT CONTROLLED WITH YOUR NAUSEA MEDICATION  *UNUSUAL SHORTNESS OF BREATH  *UNUSUAL BRUISING OR BLEEDING  TENDERNESS IN MOUTH AND THROAT WITH OR WITHOUT PRESENCE OF ULCERS  *URINARY PROBLEMS  *BOWEL PROBLEMS  UNUSUAL RASH Items with * indicate a potential emergency and should be followed up as soon as possible.  Feel free to call the clinic should you have any questions or concerns. The clinic phone number is [phone].  Please show the CHEMO ALERT CARD at check-in to the Emergency Department and triage nurse.   

## 2019-10-25 ENCOUNTER — Inpatient Hospital Stay (HOSPITAL_COMMUNITY): Payer: Medicare HMO

## 2019-10-25 ENCOUNTER — Inpatient Hospital Stay (HOSPITAL_BASED_OUTPATIENT_CLINIC_OR_DEPARTMENT_OTHER): Payer: Medicare HMO | Admitting: Hematology

## 2019-10-25 ENCOUNTER — Other Ambulatory Visit: Payer: Self-pay

## 2019-10-25 VITALS — BP 147/81 | HR 82 | Temp 97.1°F | Resp 18 | Wt 237.1 lb

## 2019-10-25 VITALS — BP 149/76 | HR 70 | Temp 97.1°F | Resp 18

## 2019-10-25 DIAGNOSIS — C259 Malignant neoplasm of pancreas, unspecified: Secondary | ICD-10-CM

## 2019-10-25 DIAGNOSIS — Z95828 Presence of other vascular implants and grafts: Secondary | ICD-10-CM

## 2019-10-25 DIAGNOSIS — C787 Secondary malignant neoplasm of liver and intrahepatic bile duct: Secondary | ICD-10-CM

## 2019-10-25 DIAGNOSIS — Z5111 Encounter for antineoplastic chemotherapy: Secondary | ICD-10-CM | POA: Diagnosis not present

## 2019-10-25 LAB — COMPREHENSIVE METABOLIC PANEL
ALT: 23 U/L (ref 0–44)
AST: 23 U/L (ref 15–41)
Albumin: 3.6 g/dL (ref 3.5–5.0)
Alkaline Phosphatase: 72 U/L (ref 38–126)
Anion gap: 9 (ref 5–15)
BUN: 9 mg/dL (ref 6–20)
CO2: 29 mmol/L (ref 22–32)
Calcium: 9 mg/dL (ref 8.9–10.3)
Chloride: 100 mmol/L (ref 98–111)
Creatinine, Ser: 0.6 mg/dL (ref 0.44–1.00)
GFR calc Af Amer: >60 mL/min (ref >60)
GFR calc non Af Amer: >60 mL/min (ref >60)
Glucose, Bld: 188 mg/dL — ABNORMAL HIGH (ref 70–99)
Potassium: 3.9 mmol/L (ref 3.5–5.1)
Sodium: 138 mmol/L (ref 135–145)
Total Bilirubin: 0.5 mg/dL (ref 0.3–1.2)
Total Protein: 6.3 g/dL — ABNORMAL LOW (ref 6.5–8.1)

## 2019-10-25 LAB — CBC WITH DIFFERENTIAL/PLATELET
Abs Immature Granulocytes: 0 10*3/uL (ref 0.00–0.07)
Basophils Absolute: 0 10*3/uL (ref 0.0–0.1)
Basophils Relative: 1 %
Eosinophils Absolute: 0.1 10*3/uL (ref 0.0–0.5)
Eosinophils Relative: 3 %
HCT: 41.7 % (ref 36.0–46.0)
Hemoglobin: 12.7 g/dL (ref 12.0–15.0)
Immature Granulocytes: 0 %
Lymphocytes Relative: 41 %
Lymphs Abs: 1.7 10*3/uL (ref 0.7–4.0)
MCH: 24.3 pg — ABNORMAL LOW (ref 26.0–34.0)
MCHC: 30.5 g/dL (ref 30.0–36.0)
MCV: 79.7 fL — ABNORMAL LOW (ref 80.0–100.0)
Monocytes Absolute: 0.5 10*3/uL (ref 0.1–1.0)
Monocytes Relative: 13 %
Neutro Abs: 1.6 10*3/uL — ABNORMAL LOW (ref 1.7–7.7)
Neutrophils Relative %: 42 %
Platelets: 156 10*3/uL (ref 150–400)
RBC: 5.23 MIL/uL — ABNORMAL HIGH (ref 3.87–5.11)
RDW: 18.4 % — ABNORMAL HIGH (ref 11.5–15.5)
WBC: 3.9 10*3/uL — ABNORMAL LOW (ref 4.0–10.5)
nRBC: 0 % (ref 0.0–0.2)

## 2019-10-25 MED ORDER — SODIUM CHLORIDE 0.9 % IV SOLN: Freq: Once | INTRAVENOUS | Status: AC

## 2019-10-25 MED ORDER — HEPARIN SOD (PORK) LOCK FLUSH 100 UNIT/ML IV SOLN
500.0000 [IU] | Freq: Once | INTRAVENOUS | Status: AC | PRN
  Administered 2019-10-25: 500 [IU]

## 2019-10-25 MED ORDER — SODIUM CHLORIDE 0.9 % IV SOLN
Freq: Once | INTRAVENOUS | Status: AC
  Filled 2019-10-25: qty 4

## 2019-10-25 MED ORDER — SODIUM CHLORIDE 0.9 % IV SOLN
800.0000 mg/m2 | Freq: Once | INTRAVENOUS | Status: AC
  Administered 2019-10-25: 1786 mg via INTRAVENOUS
  Filled 2019-10-25: qty 26.3

## 2019-10-25 MED ORDER — SODIUM CHLORIDE 0.9% FLUSH
10.0000 mL | INTRAVENOUS | Status: DC | PRN
  Administered 2019-10-25: 14:00:00 10 mL

## 2019-10-25 MED ORDER — PACLITAXEL PROTEIN-BOUND CHEMO INJECTION 100 MG
75.0000 mg/m2 | Freq: Once | INTRAVENOUS | Status: AC
  Administered 2019-10-25: 175 mg via INTRAVENOUS
  Filled 2019-10-25: qty 35

## 2019-10-25 NOTE — Progress Notes (Signed)
[place], [place] [address]   CLINIC:  Medical Oncology/Hematology  PCP:  Patient, No Pcp Per No address on file None   REASON FOR VISIT:  Follow-up for metastatic pancreatic cancer to the liver.   BRIEF ONCOLOGIC HISTORY:  Oncology History  Pancreatic cancer metastasized to liver ([place])  05/09/2019 Initial Diagnosis   Pancreatic cancer metastasized to liver ([place])   05/17/2019 -  Chemotherapy   The patient had PACLitaxel-protein bound (ABRAXANE) chemo infusion 275 mg, 125 mg/m2 = 275 mg, Intravenous,  Once, 6 of 7 cycles Dose modification: 100 mg/m2 (80 % of original dose 125 mg/m2, Cycle 1, Reason: Other (see comments), Comment: neutropenia), 93.75 mg/m2 (75 % of original dose 125 mg/m2, Cycle 1, Reason: Other (see comments), Comment: neutropenia), 100 mg/m2 (80 % of original dose 125 mg/m2, Cycle 2, Reason: Other (see comments), Comment: cytopenias with previous treatment), 75 mg/m2 (original dose 125 mg/m2, Cycle 3, Reason: Provider Judgment, Comment: neuropathy stated as 10/10 dose reduce required per NP Reynolds Bowl) Administration: 275 mg (05/17/2019), 225 mg (05/24/2019), 200 mg (05/31/2019), 225 mg (06/14/2019), 225 mg (06/28/2019), 225 mg (07/12/2019), 175 mg (07/19/2019), 175 mg (08/16/2019), 175 mg (08/31/2019), 175 mg (09/27/2019), 175 mg (10/11/2019), 175 mg (10/25/2019) ondansetron (ZOFRAN) 8 mg in sodium chloride 0.9 % 50 mL IVPB, 8 mg (100 % of original dose 8 mg), Intravenous,  Once, 1 of 1 cycle Dose modification: 8 mg (original dose 8 mg, Cycle 1) gemcitabine (GEMZAR) 2,200 mg in sodium chloride 0.9 % 250 mL chemo infusion, 2,204 mg, Intravenous,  Once, 6 of 7 cycles Dose modification: 750 mg/m2 (75 % of original dose 1,000 mg/m2, Cycle 1, Reason: Other (see comments), Comment: neutropenia), 750 mg/m2 (75 % of original dose 1,000 mg/m2, Cycle 1, Reason: Other (see comments), Comment: neutropenia), 800 mg/m2 (80 % of original dose 1,000 mg/m2,  Cycle 2, Reason: Other (see comments), Comment: cytopenia) Administration: 2,200 mg (05/17/2019), 1,600 mg (05/24/2019), 1,600 mg (05/31/2019), 1,786 mg (06/14/2019), 1,786 mg (06/28/2019), 1,786 mg (07/12/2019), 1,786 mg (07/19/2019), 1,786 mg (08/16/2019), 1,786 mg (08/31/2019), 1,786 mg (09/27/2019), 1,786 mg (10/11/2019), 1,786 mg (10/25/2019) ondansetron (ZOFRAN) 8 mg, dexamethasone (DECADRON) 10 mg in sodium chloride 0.9 % 50 mL IVPB, , Intravenous,  Once, 6 of 7 cycles Administration:  (05/17/2019),  (05/24/2019),  (05/31/2019),  (06/14/2019),  (06/28/2019),  (07/12/2019),  (07/19/2019),  (08/16/2019),  (08/31/2019),  (09/27/2019),  (10/11/2019),  (10/25/2019)  for chemotherapy treatment.    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  The Common Hereditary Gene Panel offered by [facility] includes sequencing and/or deletion duplication testing of the following 48 genes: APC, ATM, AXIN2, BARD1, BMPR1A, BRCA1, BRCA2, BRIP1, CDH1, CDK4, CDKN2A (p14ARF), CDKN2A (p16INK4a), CHEK2, CTNNA1, DICER1, EPCAM (Deletion/duplication testing only), GREM1 (promoter region deletion/duplication testing only), KIT, MEN1, MLH1, MSH2, MSH3, MSH6, MUTYH, NBN, NF1, NHTL1, PALB2, PDGFRA, PMS2, POLD1, POLE, PTEN, RAD50, RAD51C, RAD51D, RNF43, SDHB, SDHC, SDHD, SMAD4, SMARCA4. STK11, TP53, TSC1, TSC2, and VHL.  The following genes were evaluated for sequence changes only: SDHA and HOXB13 c.251G>A variant only. The report date is August 02, 2019.      CANCER STAGING: Cancer Staging No matching staging information was found for the patient.   INTERVAL HISTORY:  Doris [name] 59 y.o. [name] seen for follow-up of pancreatic cancer and toxicity assessment prior to start of cycle 6 of chemotherapy.  She has tolerated cycle 5 very well.  Appetite is 100%.  Energy  levels are 50%.  Numbness in the feet has been stable.  She is doing gabapentin 300 mg 3 times a day.  Denies any  abdominal pains today.  REVIEW OF SYSTEMS:  Review of Systems  Neurological: Positive for numbness.  All other systems reviewed and are negative.    PAST MEDICAL/SURGICAL HISTORY:  Past Medical History:  Diagnosis Date  . Diabetes mellitus without complication ([place])   . Family history of prostate cancer   . Family history of stomach cancer   . Hypertension   . Pancreatic cancer ([place])   . Port-A-Cath in place 05/16/2019   Past Surgical History:  Procedure Laterality Date  . ABDOMINAL HYSTERECTOMY    . BACK SURGERY    . KIDNEY SURGERY     per pt, had leakage  . tubal ligation Left      SOCIAL HISTORY:  Social History   Socioeconomic History  . Marital status: Divorced    Spouse name: Not on file  . Number of children: 1  . Years of education: Not on file  . Highest education level: Not on file  Occupational History  . Occupation: disabled  Tobacco Use  . Smoking status: Current Every Day Smoker    Packs/day: 1.00    Years: 45.00    Pack years: 45.00    Types: Cigarettes  . Smokeless tobacco: Never Used  Substance and Sexual Activity  . Alcohol use: Not Currently  . Drug use: Never  . Sexual activity: Not on file  Other Topics Concern  . Not on file  Social History Narrative  . Not on file   Social Determinants of Health   Financial Resource Strain: Low Risk   . Difficulty of Paying Living Expenses: Not hard at all  Food Insecurity: No Food Insecurity  . Worried About Charity fundraiser in the Last Year: Never true  . Ran Out of Food in the Last Year: Never true  Transportation Needs: Unmet Transportation Needs  . Lack of Transportation (Medical): Yes  . Lack of Transportation (Non-Medical): Yes  Physical Activity: Inactive  . Days of Exercise per Week: 0 days  . Minutes of Exercise per Session: 0 min  Stress: No Stress Concern Present  . Feeling of Stress : Not at all  Social Connections: Somewhat Isolated  . Frequency of Communication with  Friends and Family: More than three times a week  . Frequency of Social Gatherings with Friends and Family: More than three times a week  . Attends Religious Services: 1 to 4 times per year  . Active Member of Clubs or Organizations: No  . Attends Archivist Meetings: Never  . Marital Status: Divorced  Human resources officer Violence: Not At Risk  . Fear of Current or Ex-Partner: No  . Emotionally Abused: No  . Physically Abused: No  . Sexually Abused: No    FAMILY HISTORY:  Family History  Problem Relation Age of Onset  . Diabetes Mother   . Hypertension Mother   . Lung cancer Mother 31       d. 12  . Diabetes Father   . Hypertension Father   . Heart disease Brother   . Stomach cancer Paternal Aunt 79  . Stroke Paternal Grandmother   . Prostate cancer Other        PGMs brother  . Stomach cancer Other        PGMs mother    CURRENT MEDICATIONS:  Outpatient Encounter Medications as of 10/25/2019  Medication Sig  .  amLODipine (NORVASC) 5 MG tablet Take 1 tablet (5 mg total) by mouth 2 (two) times daily.  Marland Kitchen gabapentin (NEURONTIN) 300 MG capsule TAKE 1 CAPSULE(300 MG) BY MOUTH THREE TIMES DAILY  . Gemcitabine HCl (GEMZAR IV) Inject into the vein. Days 1, 8, 15 q 28 days  . insulin aspart (NOVOLOG) 100 UNIT/ML injection Inject 20 Units into the skin 3 (three) times daily before meals.  Marland Kitchen linaclotide (LINZESS) 145 MCG CAPS capsule Take 1 capsule (145 mcg total) by mouth daily before breakfast.  . lisinopril (ZESTRIL) 40 MG tablet Take 1 tablet (40 mg total) by mouth daily.  Marland Kitchen NEEDLE, DISP, 30 G (BD DISP NEEDLES) 30G X 1/2" MISC Use as directed with insulin three times daily  . ondansetron (ZOFRAN) 4 MG tablet Take 1 tablet (4 mg total) by mouth 3 (three) times daily.  . Oxycodone HCl 10 MG TABS Take 1 tablet (10 mg total) by mouth every 6 (six) hours as needed.  Marland Kitchen oxyCODONE-acetaminophen (PERCOCET) 10-325 MG tablet Take 2 tablets by mouth daily.  . OXYCONTIN 40 MG 12 hr tablet    . PACLitaxel Protein-Bound Part (ABRAXANE IV) Inject into the vein. Days 1, 8, 15 q 28 days  . Lactulose 20 GM/30ML SOLN Take 30 mLs (20 g total) by mouth at bedtime as needed. (Patient not taking: Reported on 10/25/2019)  . lidocaine-prilocaine (EMLA) cream Apply small amount to port a cath site and cover with plastic wrap 1 hour prior to chemotherapy appointments (Patient not taking: Reported on 10/25/2019)  . prochlorperazine (COMPAZINE) 10 MG tablet Take 1 tablet (10 mg total) by mouth every 6 (six) hours as needed (Nausea or vomiting). (Patient not taking: Reported on 10/25/2019)  . [DISCONTINUED] ondansetron (ZOFRAN-ODT) 4 MG disintegrating tablet    No facility-administered encounter medications on file as of 10/25/2019.    ALLERGIES:  Allergies  Allergen Reactions  . Hydrocodone Hives  . Tramadol Itching     PHYSICAL EXAM:  ECOG Performance status: 1  Vitals:   10/25/19 0949  BP: (!) 147/81  Pulse: 82  Resp: 18  Temp: (!) 97.1 F (36.2 C)  SpO2: 98%   Filed Weights   10/25/19 0949  Weight: 237 lb 1.6 oz (107.5 kg)    Physical Exam Vitals reviewed.  Constitutional:      Appearance: Normal appearance.  Cardiovascular:     Rate and Rhythm: Normal rate and regular rhythm.     Heart sounds: Normal heart sounds.  Pulmonary:     Effort: Pulmonary effort is normal.     Breath sounds: Normal breath sounds.  Abdominal:     General: There is no distension.     Palpations: Abdomen is soft. There is no mass.  Musculoskeletal:        General: No swelling.  Lymphadenopathy:     Cervical: No cervical adenopathy.  Skin:    General: Skin is warm.  Neurological:     General: No focal deficit present.     Mental Status: She is alert and oriented to person, place, and time.  Psychiatric:        Mood and Affect: Mood normal.        Behavior: Behavior normal.      LABORATORY DATA:  I have reviewed the labs as listed.  CBC    Component Value Date/Time   WBC 3.9 (L)  10/25/2019 0954   RBC 5.23 (H) 10/25/2019 0954   HGB 12.7 10/25/2019 0954   HCT 41.7 10/25/2019 0954   PLT  156 10/25/2019 0954   MCV 79.7 (L) 10/25/2019 0954   MCH 24.3 (L) 10/25/2019 0954   MCHC 30.5 10/25/2019 0954   RDW 18.4 (H) 10/25/2019 0954   LYMPHSABS 1.7 10/25/2019 0954   MONOABS 0.5 10/25/2019 0954   EOSABS 0.1 10/25/2019 0954   BASOSABS 0.0 10/25/2019 0954   CMP Latest Ref Rng & Units 10/25/2019 10/11/2019 09/27/2019  Glucose 70 - 99 mg/dL 188(H) 246(H) 285(H)  BUN 6 - 20 mg/dL _0 Creatinine 0.44 - 1.00 mg/dL 0.60 0.77 0.65  Sodium 135 - 145 mmol/L 138 134(L) 137  Potassium 3.5 - 5.1 mmol/L 3.9 3.6 3.9  Chloride 98 - 111 mmol/L 100 100 98  CO2 22 - 32 mmol/L _1 Calcium 8.9 - 10.3 mg/dL 9.0 9.1 9.0  Total Protein 6.5 - 8.1 g/dL 6.3(L) 7.2 6.6  Total Bilirubin 0.3 - 1.2 mg/dL 0.5 0.5 0.3  Alkaline Phos 38 - 126 U/L 72 75 73  AST 15 - 41 U/L _2 ALT 0 - 44 U/L _3 DIAGNOSTIC IMAGING:  I have independently reviewed the scans and discussed with the patient.   I have reviewed Venita Lick LPN's note and agree with the documentation.  I personally performed a face-to-face visit, made revisions and my assessment and plan is as follows.    ASSESSMENT & PLAN:   Pancreatic cancer metastasized to liver ([place]) 1.  Metastatic pancreatic cancer to the liver: -Biopsy of the pancreatic tail mass on 01/05/2019, moderately to poorly differentiated adenocarcinoma. -Foundation 1 testing shows MS-stable, K-ras G 12 V, CDK 6 amplification, ARID1A - 3 cycles of FOLFIRINOX from 02/25/2019 through 03/28/2019. -CTAP on 04/04/2019 shows pancreatic mass measuring 3.3 x 4.7 x 2.9 cm, hypodense lesions in the liver, largest 3.2 cm in the right hepatic lobe and 1.4 cm lesion adjacent to the gallbladder fossa.  There is a 3.3 cm hypodense lesion adjacent to the falciform ligament.  Scan was compared to prior scan from 10/21/2018. - She was thought to have progression in  chemotherapy was switched to gemcitabine and Abraxane.  She received 2 doses on 03/14/2019 and 03/28/2019 in [place]. -I do not believe she had a true progression on FOLFIRINOX. -5 cycles of gemcitabine and Abraxane on day 1 and day 15 from 05/17/2019 through 09/27/2019. -CT CAP on 08/29/2019 showed no suspicious liver lesions.  Pancreatic mass measures 4.1 x 2.8 cm in the tail.  No dilatation of the main pancreatic duct.  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  Scattered tiny bilateral pulmonary nodules too small to characterize. -I also reviewed CT scan from recent ER visit which shows malignancy stable.  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  We will consider anticoagulation if there is any abdominal pain. -She will proceed with her cycle 6-day 1 today.  I will evaluate her in 2 weeks.  2.  Abdominal pain: -She is reportedly taking oxycodone 10 mg as needed provided by her PMD. -Amylase and lipase normal.  Recent CT abdomen on 09/19/2019 did not reveal any significant findings to suggest probable colitis.  Abdominal pain has nearly resolved.  3.  Constipation: -She will continue lactulose which is helping.  She stopped taking Linzess.  4.  Peripheral neuropathy: -She will continue gabapentin 3 times a day.  Neuropathic pain has improved but she still has numbness.      Orders placed this encounter:  Orders  Placed This Encounter  Procedures  . CBC with Differential/Platelet  . Comprehensive metabolic panel      Derek Jack, MD [place] [phone]

## 2019-10-25 NOTE — Progress Notes (Signed)
Message received from [facility] LPN/ Dr. Delton Coombes. Proceed with treatment. Labs reviewed. Vital signs within parameters for treatment.  Labs within parameters for treatment.   Treatment given today per MD orders. Tolerated infusion without adverse affects. Vital signs stable. No complaints at this time. Discharged from clinic ambulatory. F/U with [facility] as scheduled.

## 2019-10-25 NOTE — Progress Notes (Signed)
Patient has been assessed, vital signs and labs have been reviewed by Dr. Katragadda. ANC, Creatinine, LFTs, and Platelets are within treatment parameters per Dr. Katragadda. The patient is good to proceed with treatment at this time.  

## 2019-10-25 NOTE — Patient Instructions (Addendum)
[facility] at [facility] Discharge Instructions  You were seen today by Dr. Katragadda. He went over your recent lab results. He will see you back in 2 weeks for labs, treatment and follow up.   Thank you for choosing [facility] at [facility] to provide your oncology and hematology care.  To afford each patient quality time with our provider, please arrive at least 15 minutes before your scheduled appointment time.   If you have a lab appointment with the Cancer Center please come in thru the  Main Entrance and check in at the main information desk  You need to re-schedule your appointment should you arrive 10 or more minutes late.  We strive to give you quality time with our providers, and arriving late affects you and other patients whose appointments are after yours.  Also, if you no show three or more times for appointments you may be dismissed from the clinic at the providers discretion.     Again, thank you for choosing [facility].  Our hope is that these requests will decrease the amount of time that you wait before being seen by our physicians.       _____________________________________________________________  Should you have questions after your visit to [facility], please contact our office at [phone] between the hours of 8:00 a.m. and 4:30 p.m.  Voicemails left after 4:00 p.m. will not be returned until the following business day.  For prescription refill requests, have your pharmacy contact our office and allow 72 hours.    Cancer Center Support Programs:   > Cancer Support Group  2nd Tuesday of the month 1pm-2pm, Journey Room    

## 2019-10-25 NOTE — Patient Instructions (Signed)
[facility] Discharge Instructions for Patients Receiving Chemotherapy  Today you received the following chemotherapy agents   To help prevent nausea and vomiting after your treatment, we encourage you to take your nausea medication   If you develop nausea and vomiting that is not controlled by your nausea medication, call the clinic.   BELOW ARE SYMPTOMS THAT SHOULD BE REPORTED IMMEDIATELY:  *FEVER GREATER THAN 100.5 F  *CHILLS WITH OR WITHOUT FEVER  NAUSEA AND VOMITING THAT IS NOT CONTROLLED WITH YOUR NAUSEA MEDICATION  *UNUSUAL SHORTNESS OF BREATH  *UNUSUAL BRUISING OR BLEEDING  TENDERNESS IN MOUTH AND THROAT WITH OR WITHOUT PRESENCE OF ULCERS  *URINARY PROBLEMS  *BOWEL PROBLEMS  UNUSUAL RASH Items with * indicate a potential emergency and should be followed up as soon as possible.  Feel free to call the clinic should you have any questions or concerns. The clinic phone number is [phone].  Please show the CHEMO ALERT CARD at check-in to the Emergency Department and triage nurse.   

## 2019-10-30 NOTE — Assessment & Plan Note (Addendum)
1.  Metastatic pancreatic cancer to the liver: -Biopsy of the pancreatic tail mass on 01/05/2019, moderately to poorly differentiated adenocarcinoma. -Foundation 1 testing shows MS-stable, K-ras G 12 V, CDK 6 amplification, ARID1A - 3 cycles of FOLFIRINOX from 02/25/2019 through 03/28/2019. -CTAP on 04/04/2019 shows pancreatic mass measuring 3.3 x 4.7 x 2.9 cm, hypodense lesions in the liver, largest 3.2 cm in the right hepatic lobe and 1.4 cm lesion adjacent to the gallbladder fossa.  There is a 3.3 cm hypodense lesion adjacent to the falciform ligament.  Scan was compared to prior scan from 10/21/2018. - She was thought to have progression in chemotherapy was switched to gemcitabine and Abraxane.  She received 2 doses on 03/14/2019 and 03/28/2019 in [place]. -I do not believe she had a true progression on FOLFIRINOX. -5 cycles of gemcitabine and Abraxane on day 1 and day 15 from 05/17/2019 through 09/27/2019. -CT CAP on 08/29/2019 showed no suspicious liver lesions.  Pancreatic mass measures 4.1 x 2.8 cm in the tail.  No dilatation of the main pancreatic duct.  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  Scattered tiny bilateral pulmonary nodules too small to characterize. -I also reviewed CT scan from recent ER visit which shows malignancy stable.  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  We will consider anticoagulation if there is any abdominal pain. -She will proceed with her cycle 6-day 1 today.  I will evaluate her in 2 weeks.  2.  Abdominal pain: -She is reportedly taking oxycodone 10 mg as needed provided by her PMD. -Amylase and lipase normal.  Recent CT abdomen on 09/19/2019 did not reveal any significant findings to suggest probable colitis.  Abdominal pain has nearly resolved.  3.  Constipation: -She will continue lactulose which is helping.  She stopped taking Linzess.  4.  Peripheral neuropathy: -She will continue gabapentin 3 times a day.   Neuropathic pain has improved but she still has numbness.

## 2019-11-08 ENCOUNTER — Encounter (HOSPITAL_COMMUNITY): Payer: Self-pay | Admitting: Hematology

## 2019-11-08 ENCOUNTER — Inpatient Hospital Stay (HOSPITAL_COMMUNITY): Payer: Medicare HMO

## 2019-11-08 ENCOUNTER — Inpatient Hospital Stay (HOSPITAL_BASED_OUTPATIENT_CLINIC_OR_DEPARTMENT_OTHER): Payer: Medicare HMO | Admitting: Hematology

## 2019-11-08 ENCOUNTER — Other Ambulatory Visit: Payer: Self-pay

## 2019-11-08 ENCOUNTER — Inpatient Hospital Stay (HOSPITAL_COMMUNITY): Payer: Medicare HMO | Attending: Hematology

## 2019-11-08 VITALS — BP 140/74 | HR 88 | Temp 97.1°F | Resp 18 | Wt 237.0 lb

## 2019-11-08 VITALS — BP 133/59 | HR 78 | Temp 97.9°F | Resp 18

## 2019-11-08 DIAGNOSIS — C252 Malignant neoplasm of tail of pancreas: Secondary | ICD-10-CM | POA: Insufficient documentation

## 2019-11-08 DIAGNOSIS — C259 Malignant neoplasm of pancreas, unspecified: Secondary | ICD-10-CM

## 2019-11-08 DIAGNOSIS — E119 Type 2 diabetes mellitus without complications: Secondary | ICD-10-CM | POA: Insufficient documentation

## 2019-11-08 DIAGNOSIS — C787 Secondary malignant neoplasm of liver and intrahepatic bile duct: Secondary | ICD-10-CM | POA: Insufficient documentation

## 2019-11-08 DIAGNOSIS — Z7951 Long term (current) use of inhaled steroids: Secondary | ICD-10-CM | POA: Insufficient documentation

## 2019-11-08 DIAGNOSIS — F17210 Nicotine dependence, cigarettes, uncomplicated: Secondary | ICD-10-CM | POA: Diagnosis not present

## 2019-11-08 DIAGNOSIS — Z8249 Family history of ischemic heart disease and other diseases of the circulatory system: Secondary | ICD-10-CM | POA: Insufficient documentation

## 2019-11-08 DIAGNOSIS — K5900 Constipation, unspecified: Secondary | ICD-10-CM | POA: Insufficient documentation

## 2019-11-08 DIAGNOSIS — Z801 Family history of malignant neoplasm of trachea, bronchus and lung: Secondary | ICD-10-CM | POA: Diagnosis not present

## 2019-11-08 DIAGNOSIS — Z794 Long term (current) use of insulin: Secondary | ICD-10-CM | POA: Insufficient documentation

## 2019-11-08 DIAGNOSIS — Z8042 Family history of malignant neoplasm of prostate: Secondary | ICD-10-CM | POA: Diagnosis not present

## 2019-11-08 DIAGNOSIS — G629 Polyneuropathy, unspecified: Secondary | ICD-10-CM | POA: Diagnosis not present

## 2019-11-08 DIAGNOSIS — Z800 Family history of malignant neoplasm of digestive organs: Secondary | ICD-10-CM | POA: Diagnosis not present

## 2019-11-08 DIAGNOSIS — Z79899 Other long term (current) drug therapy: Secondary | ICD-10-CM | POA: Diagnosis not present

## 2019-11-08 DIAGNOSIS — Z5111 Encounter for antineoplastic chemotherapy: Secondary | ICD-10-CM | POA: Insufficient documentation

## 2019-11-08 DIAGNOSIS — Z833 Family history of diabetes mellitus: Secondary | ICD-10-CM | POA: Insufficient documentation

## 2019-11-08 DIAGNOSIS — I10 Essential (primary) hypertension: Secondary | ICD-10-CM | POA: Insufficient documentation

## 2019-11-08 DIAGNOSIS — Z90710 Acquired absence of both cervix and uterus: Secondary | ICD-10-CM | POA: Insufficient documentation

## 2019-11-08 DIAGNOSIS — Z823 Family history of stroke: Secondary | ICD-10-CM | POA: Insufficient documentation

## 2019-11-08 DIAGNOSIS — Z95828 Presence of other vascular implants and grafts: Secondary | ICD-10-CM

## 2019-11-08 LAB — COMPREHENSIVE METABOLIC PANEL
ALT: 18 U/L (ref 0–44)
AST: 16 U/L (ref 15–41)
Albumin: 3.8 g/dL (ref 3.5–5.0)
Alkaline Phosphatase: 78 U/L (ref 38–126)
Anion gap: 10 (ref 5–15)
BUN: 10 mg/dL (ref 6–20)
CO2: 26 mmol/L (ref 22–32)
Calcium: 8.9 mg/dL (ref 8.9–10.3)
Chloride: 100 mmol/L (ref 98–111)
Creatinine, Ser: 0.59 mg/dL (ref 0.44–1.00)
GFR calc Af Amer: >60 mL/min (ref >60)
GFR calc non Af Amer: >60 mL/min (ref >60)
Glucose, Bld: 236 mg/dL — ABNORMAL HIGH (ref 70–99)
Potassium: 3.6 mmol/L (ref 3.5–5.1)
Sodium: 136 mmol/L (ref 135–145)
Total Bilirubin: 0.1 mg/dL — ABNORMAL LOW (ref 0.3–1.2)
Total Protein: 6.7 g/dL (ref 6.5–8.1)

## 2019-11-08 LAB — CBC WITH DIFFERENTIAL/PLATELET
Abs Immature Granulocytes: 0.03 10*3/uL (ref 0.00–0.07)
Basophils Absolute: 0 10*3/uL (ref 0.0–0.1)
Basophils Relative: 0 %
Eosinophils Absolute: 0.2 10*3/uL (ref 0.0–0.5)
Eosinophils Relative: 3 %
HCT: 41.7 % (ref 36.0–46.0)
Hemoglobin: 12.7 g/dL (ref 12.0–15.0)
Immature Granulocytes: 1 %
Lymphocytes Relative: 36 %
Lymphs Abs: 1.9 10*3/uL (ref 0.7–4.0)
MCH: 24.2 pg — ABNORMAL LOW (ref 26.0–34.0)
MCHC: 30.5 g/dL (ref 30.0–36.0)
MCV: 79.4 fL — ABNORMAL LOW (ref 80.0–100.0)
Monocytes Absolute: 0.7 10*3/uL (ref 0.1–1.0)
Monocytes Relative: 13 %
Neutro Abs: 2.5 10*3/uL (ref 1.7–7.7)
Neutrophils Relative %: 47 %
Platelets: 201 10*3/uL (ref 150–400)
RBC: 5.25 MIL/uL — ABNORMAL HIGH (ref 3.87–5.11)
RDW: 19.9 % — ABNORMAL HIGH (ref 11.5–15.5)
WBC: 5.4 10*3/uL (ref 4.0–10.5)
nRBC: 0 % (ref 0.0–0.2)

## 2019-11-08 MED ORDER — PACLITAXEL PROTEIN-BOUND CHEMO INJECTION 100 MG
75.0000 mg/m2 | Freq: Once | INTRAVENOUS | Status: AC
  Administered 2019-11-08: 12:00:00 175 mg via INTRAVENOUS
  Filled 2019-11-08: qty 35

## 2019-11-08 MED ORDER — HEPARIN SOD (PORK) LOCK FLUSH 100 UNIT/ML IV SOLN
500.0000 [IU] | Freq: Once | INTRAVENOUS | Status: AC | PRN
  Administered 2019-11-08: 500 [IU]

## 2019-11-08 MED ORDER — SODIUM CHLORIDE 0.9 % IV SOLN
800.0000 mg/m2 | Freq: Once | INTRAVENOUS | Status: AC
  Administered 2019-11-08: 1786 mg via INTRAVENOUS
  Filled 2019-11-08: qty 26.3

## 2019-11-08 MED ORDER — FLUTICASONE-SALMETEROL 250-50 MCG/DOSE IN AEPB: 1.0000 | INHALATION_SPRAY | Freq: Two times a day (BID) | RESPIRATORY_TRACT | 2 refills | Status: DC

## 2019-11-08 MED ORDER — SODIUM CHLORIDE 0.9 % IV SOLN: Freq: Once | INTRAVENOUS | Status: AC

## 2019-11-08 MED ORDER — SODIUM CHLORIDE 0.9 % IV SOLN
Freq: Once | INTRAVENOUS | Status: AC
  Filled 2019-11-08: qty 4

## 2019-11-08 MED ORDER — DOCUSATE SODIUM 100 MG PO CAPS: 200.0000 mg | ORAL_CAPSULE | Freq: Every day | ORAL | 0 refills | Status: DC

## 2019-11-08 MED ORDER — SODIUM CHLORIDE 0.9% FLUSH
10.0000 mL | INTRAVENOUS | Status: DC | PRN
  Administered 2019-11-08: 10 mL

## 2019-11-08 NOTE — Progress Notes (Signed)
[place] for treatment today per md. Labs reviewed with MD.   Treatment given per orders. Patient tolerated it well without problems. Vitals stable and discharged home from clinic ambulatory. Follow up as scheduled.

## 2019-11-08 NOTE — Assessment & Plan Note (Signed)
1.  Metastatic pancreatic cancer to the liver: -5 cycles of gemcitabine and Abraxane on day 1 and day 15 from 05/17/2019 through 09/27/2019. -Cycle 6-day 1 on 10/25/2019. -I have reviewed her labs including CBC and LFTs.  Total bilirubin was normal. -She will proceed with cycle 6-day 15 today.  We will reevaluate her in 2 weeks.  2.  Constipation: -She is using suppository.  She cannot tolerate the taste of the lactulose. -I have recommended taking Colace 200 mg at bedtime.  She was also told to mix lactulose and orange juice and drink it.  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  She does not report any pain.  Will consider anticoagulation if any abdominal pain.  4.  Peripheral neuropathy: -We will continue gabapentin 3 times a day.  Neuropathic pain has improved.  She still has some numbness.

## 2019-11-08 NOTE — Progress Notes (Signed)
[place], [place] [address]   CLINIC:  Medical Oncology/Hematology  PCP:  Merdis Delay, DO [address] [place] [address] [phone]   REASON FOR VISIT:  Follow-up for metastatic pancreatic cancer to the liver.   BRIEF ONCOLOGIC HISTORY:  Oncology History  Pancreatic cancer metastasized to liver ([place])  05/09/2019 Initial Diagnosis   Pancreatic cancer metastasized to liver ([place])   05/17/2019 -  Chemotherapy   The patient had PACLitaxel-protein bound (ABRAXANE) chemo infusion 275 mg, 125 mg/m2 = 275 mg, Intravenous,  Once, 6 of 7 cycles Dose modification: 100 mg/m2 (80 % of original dose 125 mg/m2, Cycle 1, Reason: Other (see comments), Comment: neutropenia), 93.75 mg/m2 (75 % of original dose 125 mg/m2, Cycle 1, Reason: Other (see comments), Comment: neutropenia), 100 mg/m2 (80 % of original dose 125 mg/m2, Cycle 2, Reason: Other (see comments), Comment: cytopenias with previous treatment), 75 mg/m2 (original dose 125 mg/m2, Cycle 3, Reason: Provider Judgment, Comment: neuropathy stated as 10/10 dose reduce required per NP Reynolds Bowl) Administration: 275 mg (05/17/2019), 225 mg (05/24/2019), 200 mg (05/31/2019), 225 mg (06/14/2019), 225 mg (06/28/2019), 225 mg (07/12/2019), 175 mg (07/19/2019), 175 mg (08/16/2019), 175 mg (08/31/2019), 175 mg (09/27/2019), 175 mg (10/11/2019), 175 mg (10/25/2019), 175 mg (11/08/2019) ondansetron (ZOFRAN) 8 mg in sodium chloride 0.9 % 50 mL IVPB, 8 mg (100 % of original dose 8 mg), Intravenous,  Once, 1 of 1 cycle Dose modification: 8 mg (original dose 8 mg, Cycle 1) gemcitabine (GEMZAR) 2,200 mg in sodium chloride 0.9 % 250 mL chemo infusion, 2,204 mg, Intravenous,  Once, 6 of 7 cycles Dose modification: 750 mg/m2 (75 % of original dose 1,000 mg/m2, Cycle 1, Reason: Other (see comments), Comment: neutropenia), 750 mg/m2 (75 % of original dose 1,000 mg/m2, Cycle 1, Reason: Other (see comments), Comment: neutropenia), 800  mg/m2 (80 % of original dose 1,000 mg/m2, Cycle 2, Reason: Other (see comments), Comment: cytopenia) Administration: 2,200 mg (05/17/2019), 1,600 mg (05/24/2019), 1,600 mg (05/31/2019), 1,786 mg (06/14/2019), 1,786 mg (06/28/2019), 1,786 mg (07/12/2019), 1,786 mg (07/19/2019), 1,786 mg (08/16/2019), 1,786 mg (08/31/2019), 1,786 mg (09/27/2019), 1,786 mg (10/11/2019), 1,786 mg (10/25/2019), 1,786 mg (11/08/2019) ondansetron (ZOFRAN) 8 mg, dexamethasone (DECADRON) 10 mg in sodium chloride 0.9 % 50 mL IVPB, , Intravenous,  Once, 6 of 7 cycles Administration:  (05/17/2019),  (05/24/2019),  (05/31/2019),  (06/14/2019),  (06/28/2019),  (07/12/2019),  (07/19/2019),  (08/16/2019),  (08/31/2019),  (09/27/2019),  (10/11/2019),  (10/25/2019),  (11/08/2019)  for chemotherapy treatment.    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  The Common Hereditary Gene Panel offered by [facility] includes sequencing and/or deletion duplication testing of the following 48 genes: APC, ATM, AXIN2, BARD1, BMPR1A, BRCA1, BRCA2, BRIP1, CDH1, CDK4, CDKN2A (p14ARF), CDKN2A (p16INK4a), CHEK2, CTNNA1, DICER1, EPCAM (Deletion/duplication testing only), GREM1 (promoter region deletion/duplication testing only), KIT, MEN1, MLH1, MSH2, MSH3, MSH6, MUTYH, NBN, NF1, NHTL1, PALB2, PDGFRA, PMS2, POLD1, POLE, PTEN, RAD50, RAD51C, RAD51D, RNF43, SDHB, SDHC, SDHD, SMAD4, SMARCA4. STK11, TP53, TSC1, TSC2, and VHL.  The following genes were evaluated for sequence changes only: SDHA and HOXB13 c.251G>A variant only. The report date is August 02, 2019.      CANCER STAGING: Cancer Staging No matching staging information was found for the patient.   INTERVAL HISTORY:  Ms. Nordquist 59 y.o. female seen for follow-up and toxicity assessment prior to cycle 6-day 15 of chemotherapy.  2 weeks ago she had chemo and  tolerated it reasonably well.  No GI side effects.  Complains of constipation.  Appetite and  energy levels are 100%.  Numbness in the feet has been stable.  No ER visits or hospitalizations.  REVIEW OF SYSTEMS:  Review of Systems  Gastrointestinal: Positive for constipation.  Neurological: Positive for numbness.  All other systems reviewed and are negative.    PAST MEDICAL/SURGICAL HISTORY:  Past Medical History:  Diagnosis Date  . Diabetes mellitus without complication ([place])   . Family history of prostate cancer   . Family history of stomach cancer   . Hypertension   . Pancreatic cancer ([place])   . Port-A-Cath in place 05/16/2019   Past Surgical History:  Procedure Laterality Date  . ABDOMINAL HYSTERECTOMY    . BACK SURGERY    . KIDNEY SURGERY     per pt, had leakage  . tubal ligation Left      SOCIAL HISTORY:  Social History   Socioeconomic History  . Marital status: Divorced    Spouse name: Not on file  . Number of children: 1  . Years of education: Not on file  . Highest education level: Not on file  Occupational History  . Occupation: disabled  Tobacco Use  . Smoking status: Current Every Day Smoker    Packs/day: 1.00    Years: 45.00    Pack years: 45.00    Types: Cigarettes  . Smokeless tobacco: Never Used  Substance and Sexual Activity  . Alcohol use: Not Currently  . Drug use: Never  . Sexual activity: Not on file  Other Topics Concern  . Not on file  Social History Narrative  . Not on file   Social Determinants of Health   Financial Resource Strain: Low Risk   . Difficulty of Paying Living Expenses: Not hard at all  Food Insecurity: No Food Insecurity  . Worried About Charity fundraiser in the Last Year: Never true  . Ran Out of Food in the Last Year: Never true  Transportation Needs: Unmet Transportation Needs  . Lack of Transportation (Medical): Yes  . Lack of Transportation (Non-Medical): Yes  Physical Activity: Inactive  . Days of Exercise per Week: 0 days  . Minutes of Exercise per Session: 0 min  Stress: No Stress Concern  Present  . Feeling of Stress : Not at all  Social Connections: Somewhat Isolated  . Frequency of Communication with Friends and Family: More than three times a week  . Frequency of Social Gatherings with Friends and Family: More than three times a week  . Attends Religious Services: 1 to 4 times per year  . Active Member of Clubs or Organizations: No  . Attends Archivist Meetings: Never  . Marital Status: Divorced  Human resources officer Violence: Not At Risk  . Fear of Current or Ex-Partner: No  . Emotionally Abused: No  . Physically Abused: No  . Sexually Abused: No    FAMILY HISTORY:  Family History  Problem Relation Age of Onset  . Diabetes Mother   . Hypertension Mother   . Lung cancer Mother 52       d. 23  . Diabetes Father   . Hypertension Father   . Heart disease Brother   . Stomach cancer Paternal Aunt 51  . Stroke Paternal Grandmother   . Prostate cancer Other        PGMs brother  . Stomach cancer Other        PGMs mother    CURRENT  MEDICATIONS:  Outpatient Encounter Medications as of 11/08/2019  Medication Sig  . amLODipine (NORVASC) 5 MG tablet Take 1 tablet (5 mg total) by mouth 2 (two) times daily.  Marland Kitchen gabapentin (NEURONTIN) 300 MG capsule TAKE 1 CAPSULE(300 MG) BY MOUTH THREE TIMES DAILY  . Gemcitabine HCl (GEMZAR IV) Inject into the vein. Days 1, 8, 15 q 28 days  . insulin aspart (NOVOLOG) 100 UNIT/ML injection Inject 20 Units into the skin 3 (three) times daily before meals.  Marland Kitchen linaclotide (LINZESS) 145 MCG CAPS capsule Take 1 capsule (145 mcg total) by mouth daily before breakfast.  . lisinopril (ZESTRIL) 40 MG tablet Take 1 tablet (40 mg total) by mouth daily.  Marland Kitchen NEEDLE, DISP, 30 G (BD DISP NEEDLES) 30G X 1/2" MISC Use as directed with insulin three times daily  . ondansetron (ZOFRAN) 4 MG tablet Take 1 tablet (4 mg total) by mouth 3 (three) times daily.  Marland Kitchen oxyCODONE-acetaminophen (PERCOCET) 10-325 MG tablet Take 2 tablets by mouth daily.  .  OXYCONTIN 40 MG 12 hr tablet   . PACLitaxel Protein-Bound Part (ABRAXANE IV) Inject into the vein. Days 1, 8, 15 q 28 days  . docusate sodium (COLACE) 100 MG capsule Take 2 capsules (200 mg total) by mouth at bedtime.  . Fluticasone-Salmeterol (ADVAIR DISKUS) 250-50 MCG/DOSE AEPB Inhale 1 puff into the lungs 2 (two) times daily.  . Lactulose 20 GM/30ML SOLN Take 30 mLs (20 g total) by mouth at bedtime as needed. (Patient not taking: Reported on 10/25/2019)  . lidocaine-prilocaine (EMLA) cream Apply small amount to port a cath site and cover with plastic wrap 1 hour prior to chemotherapy appointments (Patient not taking: Reported on 10/25/2019)  . Oxycodone HCl 10 MG TABS Take 1 tablet (10 mg total) by mouth every 6 (six) hours as needed. (Patient not taking: Reported on 11/08/2019)  . prochlorperazine (COMPAZINE) 10 MG tablet Take 1 tablet (10 mg total) by mouth every 6 (six) hours as needed (Nausea or vomiting). (Patient not taking: Reported on 10/25/2019)   No facility-administered encounter medications on file as of 11/08/2019.    ALLERGIES:  Allergies  Allergen Reactions  . Hydrocodone Hives  . Tramadol Itching     PHYSICAL EXAM:  ECOG Performance status: 1  Vitals:   11/08/19 0904  BP: 140/74  Pulse: 88  Resp: 18  Temp: (!) 97.1 F (36.2 C)  SpO2: 97%   Filed Weights   11/08/19 0904  Weight: 237 lb (107.5 kg)    Physical Exam Vitals reviewed.  Constitutional:      Appearance: Normal appearance.  Cardiovascular:     Rate and Rhythm: Normal rate and regular rhythm.     Heart sounds: Normal heart sounds.  Pulmonary:     Effort: Pulmonary effort is normal.     Breath sounds: Normal breath sounds.  Abdominal:     General: There is no distension.     Palpations: Abdomen is soft. There is no mass.  Musculoskeletal:        General: No swelling.  Lymphadenopathy:     Cervical: No cervical adenopathy.  Skin:    General: Skin is warm.  Neurological:     General: No focal  deficit present.     Mental Status: She is alert and oriented to person, place, and time.  Psychiatric:        Mood and Affect: Mood normal.        Behavior: Behavior normal.      LABORATORY DATA:  I have  reviewed the labs as listed.  CBC    Component Value Date/Time   WBC 5.4 11/08/2019 0908   RBC 5.25 (H) 11/08/2019 0908   HGB 12.7 11/08/2019 0908   HCT 41.7 11/08/2019 0908   PLT 201 11/08/2019 0908   MCV 79.4 (L) 11/08/2019 0908   MCH 24.2 (L) 11/08/2019 0908   MCHC 30.5 11/08/2019 0908   RDW 19.9 (H) 11/08/2019 0908   LYMPHSABS 1.9 11/08/2019 0908   MONOABS 0.7 11/08/2019 0908   EOSABS 0.2 11/08/2019 0908   BASOSABS 0.0 11/08/2019 0908   CMP Latest Ref Rng & Units 11/08/2019 10/25/2019 10/11/2019  Glucose 70 - 99 mg/dL 236(H) 188(H) 246(H)  BUN 6 - 20 mg/dL '10 9 12  ' Creatinine 0.44 - 1.00 mg/dL 0.59 0.60 0.77  Sodium 135 - 145 mmol/L 136 138 134(L)  Potassium 3.5 - 5.1 mmol/L 3.6 3.9 3.6  Chloride 98 - 111 mmol/L 100 100 100  CO2 22 - 32 mmol/L '26 29 25  ' Calcium 8.9 - 10.3 mg/dL 8.9 9.0 9.1  Total Protein 6.5 - 8.1 g/dL 6.7 6.3(L) 7.2  Total Bilirubin 0.3 - 1.2 mg/dL 0.1(L) 0.5 0.5  Alkaline Phos 38 - 126 U/L 78 72 75  AST 15 - 41 U/L '16 23 30  ' ALT 0 - 44 U/L '18 23 24       ' DIAGNOSTIC IMAGING:  I have independently reviewed the scans and discussed with the patient.   I have reviewed Venita Lick LPN's note and agree with the documentation.  I personally performed a face-to-face visit, made revisions and my assessment and plan is as follows.    ASSESSMENT & PLAN:   Pancreatic cancer metastasized to liver ([place]) 1.  Metastatic pancreatic cancer to the liver: -5 cycles of gemcitabine and Abraxane on day 1 and day 15 from 05/17/2019 through 09/27/2019. -Cycle 6-day 1 on 10/25/2019. -I have reviewed her labs including CBC and LFTs.  Total bilirubin was normal. -She will proceed with cycle 6-day 15 today.  We will reevaluate her in 2 weeks.  2.   Constipation: -She is using suppository.  She cannot tolerate the taste of the lactulose. -I have recommended taking Colace 200 mg at bedtime.  She was also told to mix lactulose and orange juice and drink it.  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  She does not report any pain.  Will consider anticoagulation if any abdominal pain.  4.  Peripheral neuropathy: -We will continue gabapentin 3 times a day.  Neuropathic pain has improved.  She still has some numbness.      Orders placed this encounter:  Orders Placed This Encounter  Procedures  . CBC with Differential/Platelet  . Comprehensive metabolic panel  . Cancer antigen 19-9      Derek Jack, MD [place] [phone]

## 2019-11-08 NOTE — Patient Instructions (Signed)
[facility] Discharge Instructions for Patients Receiving Chemotherapy  Today you received the following chemotherapy agents   To help prevent nausea and vomiting after your treatment, we encourage you to take your nausea medication   If you develop nausea and vomiting that is not controlled by your nausea medication, call the clinic.   BELOW ARE SYMPTOMS THAT SHOULD BE REPORTED IMMEDIATELY:  *FEVER GREATER THAN 100.5 F  *CHILLS WITH OR WITHOUT FEVER  NAUSEA AND VOMITING THAT IS NOT CONTROLLED WITH YOUR NAUSEA MEDICATION  *UNUSUAL SHORTNESS OF BREATH  *UNUSUAL BRUISING OR BLEEDING  TENDERNESS IN MOUTH AND THROAT WITH OR WITHOUT PRESENCE OF ULCERS  *URINARY PROBLEMS  *BOWEL PROBLEMS  UNUSUAL RASH Items with * indicate a potential emergency and should be followed up as soon as possible.  Feel free to call the clinic should you have any questions or concerns. The clinic phone number is [phone].  Please show the CHEMO ALERT CARD at check-in to the Emergency Department and triage nurse.   

## 2019-11-08 NOTE — Progress Notes (Signed)
Patient has been assessed, vital signs and labs have been reviewed by Dr. Katragadda. ANC, Creatinine, LFTs, and Platelets are within treatment parameters per Dr. Katragadda. The patient is good to proceed with treatment at this time.  

## 2019-11-08 NOTE — Patient Instructions (Addendum)
[place] at [facility] Discharge Instructions  You were seen today by Dr. Delton Coombes. He went over your recent lab results. He will send in send prescriptions to your pharmacy for Advair and colace. He will see you back in 2 weeks for labs, treatment and follow up.   Thank you for choosing [place] at [facility] to provide your oncology and hematology care.  To afford each patient quality time with our provider, please arrive at least 15 minutes before your scheduled appointment time.   If you have a lab appointment with the [facility] please come in thru the  Main Entrance and check in at the main information desk  You need to re-schedule your appointment should you arrive 10 or more minutes late.  We strive to give you quality time with our providers, and arriving late affects you and other patients whose appointments are after yours.  Also, if you no show three or more times for appointments you may be dismissed from the clinic at the providers discretion.     Again, thank you for choosing [facility].  Our hope is that these requests will decrease the amount of time that you wait before being seen by our physicians.       _____________________________________________________________  Should you have questions after your visit to [facility], please contact our office at [phone] between the hours of 8:00 a.m. and 4:30 p.m.  Voicemails left after 4:00 p.m. will not be returned until the following business day.  For prescription refill requests, have your pharmacy contact our office and allow 72 hours.    Cancer Center Support Programs:   > Cancer Support Group  2nd Tuesday of the month 1pm-2pm, Journey Room

## 2019-11-23 ENCOUNTER — Encounter (HOSPITAL_COMMUNITY): Payer: Self-pay | Admitting: Hematology

## 2019-11-23 ENCOUNTER — Inpatient Hospital Stay (HOSPITAL_BASED_OUTPATIENT_CLINIC_OR_DEPARTMENT_OTHER): Payer: Medicare HMO | Admitting: Hematology

## 2019-11-23 ENCOUNTER — Inpatient Hospital Stay (HOSPITAL_COMMUNITY): Payer: Medicare HMO

## 2019-11-23 ENCOUNTER — Other Ambulatory Visit: Payer: Self-pay

## 2019-11-23 VITALS — BP 127/67 | HR 89 | Temp 97.1°F | Resp 18

## 2019-11-23 DIAGNOSIS — C787 Secondary malignant neoplasm of liver and intrahepatic bile duct: Secondary | ICD-10-CM | POA: Diagnosis not present

## 2019-11-23 DIAGNOSIS — C259 Malignant neoplasm of pancreas, unspecified: Secondary | ICD-10-CM | POA: Diagnosis not present

## 2019-11-23 DIAGNOSIS — Z95828 Presence of other vascular implants and grafts: Secondary | ICD-10-CM

## 2019-11-23 DIAGNOSIS — Z5111 Encounter for antineoplastic chemotherapy: Secondary | ICD-10-CM | POA: Diagnosis not present

## 2019-11-23 LAB — CBC WITH DIFFERENTIAL/PLATELET
Abs Immature Granulocytes: 0.01 10*3/uL (ref 0.00–0.07)
Basophils Absolute: 0 10*3/uL (ref 0.0–0.1)
Basophils Relative: 0 %
Eosinophils Absolute: 0.1 10*3/uL (ref 0.0–0.5)
Eosinophils Relative: 3 %
HCT: 41.6 % (ref 36.0–46.0)
Hemoglobin: 13 g/dL (ref 12.0–15.0)
Immature Granulocytes: 0 %
Lymphocytes Relative: 40 %
Lymphs Abs: 2.3 10*3/uL (ref 0.7–4.0)
MCH: 24.7 pg — ABNORMAL LOW (ref 26.0–34.0)
MCHC: 31.3 g/dL (ref 30.0–36.0)
MCV: 79.1 fL — ABNORMAL LOW (ref 80.0–100.0)
Monocytes Absolute: 0.6 10*3/uL (ref 0.1–1.0)
Monocytes Relative: 10 %
Neutro Abs: 2.7 10*3/uL (ref 1.7–7.7)
Neutrophils Relative %: 47 %
Platelets: 222 10*3/uL (ref 150–400)
RBC: 5.26 MIL/uL — ABNORMAL HIGH (ref 3.87–5.11)
RDW: 20.7 % — ABNORMAL HIGH (ref 11.5–15.5)
WBC: 5.7 10*3/uL (ref 4.0–10.5)
nRBC: 0 % (ref 0.0–0.2)

## 2019-11-23 LAB — COMPREHENSIVE METABOLIC PANEL
ALT: 23 U/L (ref 0–44)
AST: 23 U/L (ref 15–41)
Albumin: 3.8 g/dL (ref 3.5–5.0)
Alkaline Phosphatase: 80 U/L (ref 38–126)
Anion gap: 10 (ref 5–15)
BUN: 12 mg/dL (ref 6–20)
CO2: 27 mmol/L (ref 22–32)
Calcium: 8.9 mg/dL (ref 8.9–10.3)
Chloride: 98 mmol/L (ref 98–111)
Creatinine, Ser: 0.69 mg/dL (ref 0.44–1.00)
GFR calc Af Amer: >60 mL/min (ref >60)
GFR calc non Af Amer: >60 mL/min (ref >60)
Glucose, Bld: 216 mg/dL — ABNORMAL HIGH (ref 70–99)
Potassium: 3.8 mmol/L (ref 3.5–5.1)
Sodium: 135 mmol/L (ref 135–145)
Total Bilirubin: 0.4 mg/dL (ref 0.3–1.2)
Total Protein: 6.7 g/dL (ref 6.5–8.1)

## 2019-11-23 MED ORDER — AMOXICILLIN-POT CLAVULANATE 875-125 MG PO TABS: 1.0000 | ORAL_TABLET | Freq: Two times a day (BID) | ORAL | 0 refills | Status: DC

## 2019-11-23 MED ORDER — PACLITAXEL PROTEIN-BOUND CHEMO INJECTION 100 MG
75.0000 mg/m2 | Freq: Once | INTRAVENOUS | Status: AC
  Administered 2019-11-23: 175 mg via INTRAVENOUS
  Filled 2019-11-23: qty 35

## 2019-11-23 MED ORDER — SODIUM CHLORIDE 0.9 % IV SOLN
Freq: Once | INTRAVENOUS | Status: AC
  Filled 2019-11-23: qty 4

## 2019-11-23 MED ORDER — HEPARIN SOD (PORK) LOCK FLUSH 100 UNIT/ML IV SOLN
500.0000 [IU] | Freq: Once | INTRAVENOUS | Status: AC | PRN
  Administered 2019-11-23: 13:00:00 500 [IU]

## 2019-11-23 MED ORDER — GABAPENTIN 300 MG PO CAPS: ORAL_CAPSULE | ORAL | 3 refills | Status: DC

## 2019-11-23 MED ORDER — SODIUM CHLORIDE 0.9% FLUSH: 10.0000 mL | INTRAVENOUS | Status: DC | PRN

## 2019-11-23 MED ORDER — SODIUM CHLORIDE 0.9 % IV SOLN: Freq: Once | INTRAVENOUS | Status: AC

## 2019-11-23 MED ORDER — SODIUM CHLORIDE 0.9 % IV SOLN
800.0000 mg/m2 | Freq: Once | INTRAVENOUS | Status: AC
  Administered 2019-11-23: 12:00:00 1786 mg via INTRAVENOUS
  Filled 2019-11-23: qty 21.04

## 2019-11-23 NOTE — Progress Notes (Signed)
Patient presents today for follow up visit with Dr. Delton Coombes and treatment. Vital signs within parameters for treatment. HR 100 today. Labs pending. Patient has no complaints of any pain today. Patient denies any changes since her last visit. Patient states, " I am feeling pretty good today."   Labs within parameters for treatment.   Treatment given today per MD orders. Tolerated infusion without adverse affects. Vital signs stable. No complaints at this time. Discharged from clinic ambulatory. F/U with [facility] as scheduled.

## 2019-11-23 NOTE — Assessment & Plan Note (Signed)
1.  Metastatic pancreatic cancer to the liver: -6 cycles of gemcitabine and Abraxane on day 1 and day 15 from 05/17/2019 through 10/25/2019. -CT of the abdomen and pelvis after cycle 4 on 08/29/2019 showed improvement when independently reviewed by me. -Cycle 6-day 15 was on 11/08/2019. -I have reviewed her CBC which showed normal white count of 5.7 and platelet count of 222.  LFTs are normal. -We have ordered a CA 19-9 which is pending. -She will proceed with cycle 7-day 1 treatment today.  We will reevaluate her in 2 weeks.  I plan to repeat scan after cycle 7 or 8 depending on CA 19-9 level.  2.  Peripheral neuropathy: -She will continue gabapentin 300 mg 3 times a day.  I will send another refill today.  3.  Constipation: -She will continue Colace 200 mg at bedtime.  She will use lactulose as needed.  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  This was incidentally found on the most recent CT scan. -We will consider anticoagulation if any abdominal pain.  5.  Left ear pain: -Examination of the ear canal shows bulging of her left eardrum with mild erythema.  She reports decreased hearing in the left ear. -I will start her on Augmentin 875 mg twice daily for 7 days.

## 2019-11-23 NOTE — Progress Notes (Signed)
Patient has been assessed, vital signs and labs have been reviewed by Dr. Katragadda. ANC, Creatinine, LFTs, and Platelets are within treatment parameters per Dr. Katragadda. The patient is good to proceed with treatment at this time.  

## 2019-11-23 NOTE — Patient Instructions (Signed)
[facility] Discharge Instructions for Patients Receiving Chemotherapy  Today you received the following chemotherapy agents   To help prevent nausea and vomiting after your treatment, we encourage you to take your nausea medication   If you develop nausea and vomiting that is not controlled by your nausea medication, call the clinic.   BELOW ARE SYMPTOMS THAT SHOULD BE REPORTED IMMEDIATELY:  *FEVER GREATER THAN 100.5 F  *CHILLS WITH OR WITHOUT FEVER  NAUSEA AND VOMITING THAT IS NOT CONTROLLED WITH YOUR NAUSEA MEDICATION  *UNUSUAL SHORTNESS OF BREATH  *UNUSUAL BRUISING OR BLEEDING  TENDERNESS IN MOUTH AND THROAT WITH OR WITHOUT PRESENCE OF ULCERS  *URINARY PROBLEMS  *BOWEL PROBLEMS  UNUSUAL RASH Items with * indicate a potential emergency and should be followed up as soon as possible.  Feel free to call the clinic should you have any questions or concerns. The clinic phone number is [phone].  Please show the CHEMO ALERT CARD at check-in to the Emergency Department and triage nurse.   

## 2019-11-23 NOTE — Progress Notes (Signed)
[place], [place] [address]   CLINIC:  Medical Oncology/Hematology  PCP:  Merdis Delay, DO [address] [place] [address] [phone]   REASON FOR VISIT:  Follow-up for metastatic pancreatic cancer to the liver.   BRIEF ONCOLOGIC HISTORY:  Oncology History  Pancreatic cancer metastasized to liver ([place])  05/09/2019 Initial Diagnosis   Pancreatic cancer metastasized to liver ([place])   05/17/2019 -  Chemotherapy   The patient had PACLitaxel-protein bound (ABRAXANE) chemo infusion 275 mg, 125 mg/m2 = 275 mg, Intravenous,  Once, 7 of 8 cycles Dose modification: 100 mg/m2 (80 % of original dose 125 mg/m2, Cycle 1, Reason: Other (see comments), Comment: neutropenia), 93.75 mg/m2 (75 % of original dose 125 mg/m2, Cycle 1, Reason: Other (see comments), Comment: neutropenia), 100 mg/m2 (80 % of original dose 125 mg/m2, Cycle 2, Reason: Other (see comments), Comment: cytopenias with previous treatment), 75 mg/m2 (original dose 125 mg/m2, Cycle 3, Reason: Provider Judgment, Comment: neuropathy stated as 10/10 dose reduce required per NP Reynolds Bowl) Administration: 275 mg (05/17/2019), 225 mg (05/24/2019), 200 mg (05/31/2019), 225 mg (06/14/2019), 225 mg (06/28/2019), 225 mg (07/12/2019), 175 mg (07/19/2019), 175 mg (08/16/2019), 175 mg (08/31/2019), 175 mg (09/27/2019), 175 mg (10/11/2019), 175 mg (10/25/2019), 175 mg (11/08/2019), 175 mg (11/23/2019) ondansetron (ZOFRAN) 8 mg in sodium chloride 0.9 % 50 mL IVPB, 8 mg (100 % of original dose 8 mg), Intravenous,  Once, 1 of 1 cycle Dose modification: 8 mg (original dose 8 mg, Cycle 1) gemcitabine (GEMZAR) 2,200 mg in sodium chloride 0.9 % 250 mL chemo infusion, 2,204 mg, Intravenous,  Once, 7 of 8 cycles Dose modification: 750 mg/m2 (75 % of original dose 1,000 mg/m2, Cycle 1, Reason: Other (see comments), Comment: neutropenia), 750 mg/m2 (75 % of original dose 1,000 mg/m2, Cycle 1, Reason: Other (see comments),  Comment: neutropenia), 800 mg/m2 (80 % of original dose 1,000 mg/m2, Cycle 2, Reason: Other (see comments), Comment: cytopenia) Administration: 2,200 mg (05/17/2019), 1,600 mg (05/24/2019), 1,600 mg (05/31/2019), 1,786 mg (06/14/2019), 1,786 mg (06/28/2019), 1,786 mg (07/12/2019), 1,786 mg (07/19/2019), 1,786 mg (08/16/2019), 1,786 mg (08/31/2019), 1,786 mg (09/27/2019), 1,786 mg (10/11/2019), 1,786 mg (10/25/2019), 1,786 mg (11/08/2019), 1,786 mg (11/23/2019) ondansetron (ZOFRAN) 8 mg, dexamethasone (DECADRON) 10 mg in sodium chloride 0.9 % 50 mL IVPB, , Intravenous,  Once, 7 of 8 cycles Administration:  (05/17/2019),  (05/24/2019),  (05/31/2019),  (06/14/2019),  (06/28/2019),  (07/12/2019),  (07/19/2019),  (08/16/2019),  (08/31/2019),  (09/27/2019),  (10/11/2019),  (10/25/2019),  (11/08/2019),  (11/23/2019)  for chemotherapy treatment.    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  The Common Hereditary Gene Panel offered by [facility] includes sequencing and/or deletion duplication testing of the following 48 genes: APC, ATM, AXIN2, BARD1, BMPR1A, BRCA1, BRCA2, BRIP1, CDH1, CDK4, CDKN2A (p14ARF), CDKN2A (p16INK4a), CHEK2, CTNNA1, DICER1, EPCAM (Deletion/duplication testing only), GREM1 (promoter region deletion/duplication testing only), KIT, MEN1, MLH1, MSH2, MSH3, MSH6, MUTYH, NBN, NF1, NHTL1, PALB2, PDGFRA, PMS2, POLD1, POLE, PTEN, RAD50, RAD51C, RAD51D, RNF43, SDHB, SDHC, SDHD, SMAD4, SMARCA4. STK11, TP53, TSC1, TSC2, and VHL.  The following genes were evaluated for sequence changes only: SDHA and HOXB13 c.251G>A variant only. The report date is August 02, 2019.      CANCER STAGING: Cancer Staging No matching staging information was found for the patient.   INTERVAL HISTORY:  Doris [name] 59 y.o. female seen for follow-up and toxicity assessment prior to cycle 7 of chemotherapy.  She reports appetite and energy levels nearly 100%.  Reports shortness  of breath with activity.  Constipation is controlled with stool softener.  Numbness in the feet has been stable.  She requests refill for gabapentin.  She reports difficulty hearing through the left ear.  She also reports mild pain.  Denies any discharge through the ear.  Denies any fevers or chills.  No abdominal pains reported.  REVIEW OF SYSTEMS:  Review of Systems  HENT:   Positive for hearing loss.   Gastrointestinal: Positive for constipation.  Neurological: Positive for numbness.  All other systems reviewed and are negative.    PAST MEDICAL/SURGICAL HISTORY:  Past Medical History:  Diagnosis Date  . Diabetes mellitus without complication ([place])   . Family history of prostate cancer   . Family history of stomach cancer   . Hypertension   . Pancreatic cancer ([place])   . Port-A-Cath in place 05/16/2019   Past Surgical History:  Procedure Laterality Date  . ABDOMINAL HYSTERECTOMY    . BACK SURGERY    . KIDNEY SURGERY     per pt, had leakage  . tubal ligation Left      SOCIAL HISTORY:  Social History   Socioeconomic History  . Marital status: Divorced    Spouse name: Not on file  . Number of children: 1  . Years of education: Not on file  . Highest education level: Not on file  Occupational History  . Occupation: disabled  Tobacco Use  . Smoking status: Current Every Day Smoker    Packs/day: 1.00    Years: 45.00    Pack years: 45.00    Types: Cigarettes  . Smokeless tobacco: Never Used  Substance and Sexual Activity  . Alcohol use: Not Currently  . Drug use: Never  . Sexual activity: Not on file  Other Topics Concern  . Not on file  Social History Narrative  . Not on file   Social Determinants of Health   Financial Resource Strain: Low Risk   . Difficulty of Paying Living Expenses: Not hard at all  Food Insecurity: No Food Insecurity  . Worried About Charity fundraiser in the Last Year: Never true  . Ran Out of Food in the Last Year: Never true    Transportation Needs: Unmet Transportation Needs  . Lack of Transportation (Medical): Yes  . Lack of Transportation (Non-Medical): Yes  Physical Activity: Inactive  . Days of Exercise per Week: 0 days  . Minutes of Exercise per Session: 0 min  Stress: No Stress Concern Present  . Feeling of Stress : Not at all  Social Connections: Somewhat Isolated  . Frequency of Communication with Friends and Family: More than three times a week  . Frequency of Social Gatherings with Friends and Family: More than three times a week  . Attends Religious Services: 1 to 4 times per year  . Active Member of Clubs or Organizations: No  . Attends Archivist Meetings: Never  . Marital Status: Divorced  Human resources officer Violence: Not At Risk  . Fear of Current or Ex-Partner: No  . Emotionally Abused: No  . Physically Abused: No  . Sexually Abused: No    FAMILY HISTORY:  Family History  Problem Relation Age of Onset  . Diabetes Mother   . Hypertension Mother   . Lung cancer Mother 70       d. 68  . Diabetes Father   . Hypertension Father   . Heart disease Brother   .  Stomach cancer Paternal Aunt 29  . Stroke Paternal Grandmother   . Prostate cancer Other        PGMs brother  . Stomach cancer Other        PGMs mother    CURRENT MEDICATIONS:  Outpatient Encounter Medications as of 11/23/2019  Medication Sig  . Oxycodone HCl 10 MG TABS Take 1 tablet (10 mg total) by mouth every 6 (six) hours as needed.  Marland Kitchen amLODipine (NORVASC) 5 MG tablet Take 1 tablet (5 mg total) by mouth 2 (two) times daily.  Marland Kitchen amoxicillin-clavulanate (AUGMENTIN) 875-125 MG tablet Take 1 tablet by mouth 2 (two) times daily.  Marland Kitchen docusate sodium (COLACE) 100 MG capsule Take 2 capsules (200 mg total) by mouth at bedtime.  . Fluticasone-Salmeterol (ADVAIR DISKUS) 250-50 MCG/DOSE AEPB Inhale 1 puff into the lungs 2 (two) times daily.  Marland Kitchen gabapentin (NEURONTIN) 300 MG capsule TAKE 1 CAPSULE(300 MG) BY MOUTH THREE TIMES  DAILY  . Gemcitabine HCl (GEMZAR IV) Inject into the vein. Days 1, 8, 15 q 28 days  . insulin aspart (NOVOLOG) 100 UNIT/ML injection Inject 20 Units into the skin 3 (three) times daily before meals.  . Insulin Syringe-Needle U-100 (INSULIN SYRINGE 1CC/31GX5/16") 31G X 5/16" 1 ML MISC   . Lactulose 20 GM/30ML SOLN Take 30 mLs (20 g total) by mouth at bedtime as needed. (Patient not taking: Reported on 10/25/2019)  . lidocaine-prilocaine (EMLA) cream Apply small amount to port a cath site and cover with plastic wrap 1 hour prior to chemotherapy appointments (Patient not taking: Reported on 10/25/2019)  . linaclotide (LINZESS) 145 MCG CAPS capsule Take 1 capsule (145 mcg total) by mouth daily before breakfast.  . lisinopril (ZESTRIL) 40 MG tablet Take 1 tablet (40 mg total) by mouth daily.  Marland Kitchen NEEDLE, DISP, 30 G (BD DISP NEEDLES) 30G X 1/2" MISC Use as directed with insulin three times daily  . ondansetron (ZOFRAN) 4 MG tablet Take 1 tablet (4 mg total) by mouth 3 (three) times daily.  Marland Kitchen oxyCODONE-acetaminophen (PERCOCET) 10-325 MG tablet Take 2 tablets by mouth daily.  . OXYCONTIN 40 MG 12 hr tablet Take 40 mg by mouth every 12 (twelve) hours.   Marland Kitchen PACLitaxel Protein-Bound Part (ABRAXANE IV) Inject into the vein. Days 1, 8, 15 q 28 days  . prochlorperazine (COMPAZINE) 10 MG tablet Take 1 tablet (10 mg total) by mouth every 6 (six) hours as needed (Nausea or vomiting). (Patient not taking: Reported on 10/25/2019)  . [DISCONTINUED] gabapentin (NEURONTIN) 300 MG capsule TAKE 1 CAPSULE(300 MG) BY MOUTH THREE TIMES DAILY   No facility-administered encounter medications on file as of 11/23/2019.    ALLERGIES:  Allergies  Allergen Reactions  . Hydrocodone Hives  . Tramadol Itching     PHYSICAL EXAM:  ECOG Performance status: 1  Vitals:   11/23/19 0912  BP: 138/70  Pulse: 100  Resp: 18  Temp: (!) 97.1 F (36.2 C)  SpO2: 96%   Filed Weights   11/23/19 0912  Weight: 239 lb 3.2 oz (108.5 kg)     Physical Exam Vitals reviewed.  Constitutional:      Appearance: Normal appearance.  Cardiovascular:     Rate and Rhythm: Normal rate and regular rhythm.     Heart sounds: Normal heart sounds.  Pulmonary:     Effort: Pulmonary effort is normal.     Breath sounds: Normal breath sounds.  Abdominal:     General: There is no distension.     Palpations: Abdomen is soft.  There is no mass.  Musculoskeletal:        General: No swelling.  Lymphadenopathy:     Cervical: No cervical adenopathy.  Skin:    General: Skin is warm.  Neurological:     General: No focal deficit present.     Mental Status: She is alert and oriented to person, place, and time.  Psychiatric:        Mood and Affect: Mood normal.        Behavior: Behavior normal.    Examination of the left ear shows intact eardrum with mild erythema and bulging.  LABORATORY DATA:  I have reviewed the labs as listed.  CBC    Component Value Date/Time   WBC 5.7 11/23/2019 0929   RBC 5.26 (H) 11/23/2019 0929   HGB 13.0 11/23/2019 0929   HCT 41.6 11/23/2019 0929   PLT 222 11/23/2019 0929   MCV 79.1 (L) 11/23/2019 0929   MCH 24.7 (L) 11/23/2019 0929   MCHC 31.3 11/23/2019 0929   RDW 20.7 (H) 11/23/2019 0929   LYMPHSABS 2.3 11/23/2019 0929   MONOABS 0.6 11/23/2019 0929   EOSABS 0.1 11/23/2019 0929   BASOSABS 0.0 11/23/2019 0929   CMP Latest Ref Rng & Units 11/23/2019 11/08/2019 10/25/2019  Glucose 70 - 99 mg/dL 216(H) 236(H) 188(H)  BUN 6 - 20 mg/dL _0 Creatinine 0.44 - 1.00 mg/dL 0.69 0.59 0.60  Sodium 135 - 145 mmol/L 135 136 138  Potassium 3.5 - 5.1 mmol/L 3.8 3.6 3.9  Chloride 98 - 111 mmol/L 98 100 100  CO2 22 - 32 mmol/L _1 Calcium 8.9 - 10.3 mg/dL 8.9 8.9 9.0  Total Protein 6.5 - 8.1 g/dL 6.7 6.7 6.3(L)  Total Bilirubin 0.3 - 1.2 mg/dL 0.4 0.1(L) 0.5  Alkaline Phos 38 - 126 U/L 80 78 72  AST 15 - 41 U/L _2 ALT 0 - 44 U/L _3 DIAGNOSTIC IMAGING:  I have independently  reviewed the scans and discussed with the patient.   I have reviewed Venita Lick LPN's note and agree with the documentation.  I personally performed a face-to-face visit, made revisions and my assessment and plan is as follows.    ASSESSMENT & PLAN:   Pancreatic cancer metastasized to liver ([place]) 1.  Metastatic pancreatic cancer to the liver: -6 cycles of gemcitabine and Abraxane on day 1 and day 15 from 05/17/2019 through 10/25/2019. -CT of the abdomen and pelvis after cycle 4 on 08/29/2019 showed improvement when independently reviewed by me. -Cycle 6-day 15 was on 11/08/2019. -I have reviewed her CBC which showed normal white count of 5.7 and platelet count of 222.  LFTs are normal. -We have ordered a CA 19-9 which is pending. -She will proceed with cycle 7-day 1 treatment today.  We will reevaluate her in 2 weeks.  I plan to repeat scan after cycle 7 or 8 depending on CA 19-9 level.  2.  Peripheral neuropathy: -She will continue gabapentin 300 mg 3 times a day.  I will send another refill today.  3.  Constipation: -She will continue Colace 200 mg at bedtime.  She will use lactulose as needed.  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  This was incidentally found on the most recent CT scan. -We will consider anticoagulation if any abdominal pain.  5.  Left ear pain: -Examination of the ear canal shows bulging of her left  eardrum with mild erythema.  She reports decreased hearing in the left ear. -I will start her on Augmentin 875 mg twice daily for 7 days.      Orders placed this encounter:  No orders of the defined types were placed in this encounter.     Derek Jack, MD [place] [phone]

## 2019-11-23 NOTE — Patient Instructions (Addendum)
[facility] at [facility] Discharge Instructions  You were seen today by Dr. Katragadda. He went over your recent lab results. He will see you back in 2 weeks for labs treatment and follow up.   Thank you for choosing [facility] at [facility] to provide your oncology and hematology care.  To afford each patient quality time with our provider, please arrive at least 15 minutes before your scheduled appointment time.   If you have a lab appointment with the Cancer Center please come in thru the  Main Entrance and check in at the main information desk  You need to re-schedule your appointment should you arrive 10 or more minutes late.  We strive to give you quality time with our providers, and arriving late affects you and other patients whose appointments are after yours.  Also, if you no show three or more times for appointments you may be dismissed from the clinic at the providers discretion.     Again, thank you for choosing [facility].  Our hope is that these requests will decrease the amount of time that you wait before being seen by our physicians.       _____________________________________________________________  Should you have questions after your visit to [facility], please contact our office at [phone] between the hours of 8:00 a.m. and 4:30 p.m.  Voicemails left after 4:00 p.m. will not be returned until the following business day.  For prescription refill requests, have your pharmacy contact our office and allow 72 hours.    Cancer Center Support Programs:   > Cancer Support Group  2nd Tuesday of the month 1pm-2pm, Journey Room    

## 2019-11-24 LAB — CANCER ANTIGEN 19-9: CA 19-9: 3 U/mL (ref 0–35)

## 2019-12-08 ENCOUNTER — Inpatient Hospital Stay (HOSPITAL_COMMUNITY): Payer: Medicare HMO | Attending: Hematology

## 2019-12-08 ENCOUNTER — Encounter (HOSPITAL_COMMUNITY): Payer: Self-pay | Admitting: Hematology

## 2019-12-08 ENCOUNTER — Encounter (HOSPITAL_COMMUNITY): Payer: Self-pay

## 2019-12-08 ENCOUNTER — Inpatient Hospital Stay (HOSPITAL_COMMUNITY): Payer: Medicare HMO

## 2019-12-08 ENCOUNTER — Other Ambulatory Visit: Payer: Self-pay

## 2019-12-08 ENCOUNTER — Inpatient Hospital Stay (HOSPITAL_BASED_OUTPATIENT_CLINIC_OR_DEPARTMENT_OTHER): Payer: Medicare HMO | Admitting: Hematology

## 2019-12-08 VITALS — BP 116/47 | HR 85 | Temp 98.1°F | Resp 18

## 2019-12-08 DIAGNOSIS — C252 Malignant neoplasm of tail of pancreas: Secondary | ICD-10-CM | POA: Insufficient documentation

## 2019-12-08 DIAGNOSIS — I10 Essential (primary) hypertension: Secondary | ICD-10-CM | POA: Diagnosis not present

## 2019-12-08 DIAGNOSIS — Z90710 Acquired absence of both cervix and uterus: Secondary | ICD-10-CM | POA: Diagnosis not present

## 2019-12-08 DIAGNOSIS — C787 Secondary malignant neoplasm of liver and intrahepatic bile duct: Secondary | ICD-10-CM

## 2019-12-08 DIAGNOSIS — Z7951 Long term (current) use of inhaled steroids: Secondary | ICD-10-CM | POA: Diagnosis not present

## 2019-12-08 DIAGNOSIS — Z800 Family history of malignant neoplasm of digestive organs: Secondary | ICD-10-CM | POA: Insufficient documentation

## 2019-12-08 DIAGNOSIS — Z5111 Encounter for antineoplastic chemotherapy: Secondary | ICD-10-CM | POA: Diagnosis present

## 2019-12-08 DIAGNOSIS — Z8249 Family history of ischemic heart disease and other diseases of the circulatory system: Secondary | ICD-10-CM | POA: Insufficient documentation

## 2019-12-08 DIAGNOSIS — Z79899 Other long term (current) drug therapy: Secondary | ICD-10-CM | POA: Diagnosis not present

## 2019-12-08 DIAGNOSIS — Z794 Long term (current) use of insulin: Secondary | ICD-10-CM | POA: Insufficient documentation

## 2019-12-08 DIAGNOSIS — Z833 Family history of diabetes mellitus: Secondary | ICD-10-CM | POA: Diagnosis not present

## 2019-12-08 DIAGNOSIS — C259 Malignant neoplasm of pancreas, unspecified: Secondary | ICD-10-CM | POA: Diagnosis not present

## 2019-12-08 DIAGNOSIS — E119 Type 2 diabetes mellitus without complications: Secondary | ICD-10-CM | POA: Insufficient documentation

## 2019-12-08 DIAGNOSIS — F17210 Nicotine dependence, cigarettes, uncomplicated: Secondary | ICD-10-CM | POA: Diagnosis not present

## 2019-12-08 DIAGNOSIS — Z8042 Family history of malignant neoplasm of prostate: Secondary | ICD-10-CM | POA: Diagnosis not present

## 2019-12-08 DIAGNOSIS — Z801 Family history of malignant neoplasm of trachea, bronchus and lung: Secondary | ICD-10-CM | POA: Diagnosis not present

## 2019-12-08 DIAGNOSIS — Z95828 Presence of other vascular implants and grafts: Secondary | ICD-10-CM

## 2019-12-08 LAB — COMPREHENSIVE METABOLIC PANEL
ALT: 24 U/L (ref 0–44)
AST: 25 U/L (ref 15–41)
Albumin: 3.9 g/dL (ref 3.5–5.0)
Alkaline Phosphatase: 90 U/L (ref 38–126)
Anion gap: 11 (ref 5–15)
BUN: 16 mg/dL (ref 6–20)
CO2: 29 mmol/L (ref 22–32)
Calcium: 9.1 mg/dL (ref 8.9–10.3)
Chloride: 96 mmol/L — ABNORMAL LOW (ref 98–111)
Creatinine, Ser: 0.8 mg/dL (ref 0.44–1.00)
GFR calc Af Amer: >60 mL/min (ref >60)
GFR calc non Af Amer: >60 mL/min (ref >60)
Glucose, Bld: 243 mg/dL — ABNORMAL HIGH (ref 70–99)
Potassium: 3.4 mmol/L — ABNORMAL LOW (ref 3.5–5.1)
Sodium: 136 mmol/L (ref 135–145)
Total Bilirubin: 0.3 mg/dL (ref 0.3–1.2)
Total Protein: 7.2 g/dL (ref 6.5–8.1)

## 2019-12-08 LAB — CBC WITH DIFFERENTIAL/PLATELET
Abs Immature Granulocytes: 0.02 10*3/uL (ref 0.00–0.07)
Basophils Absolute: 0 10*3/uL (ref 0.0–0.1)
Basophils Relative: 0 %
Eosinophils Absolute: 0.2 10*3/uL (ref 0.0–0.5)
Eosinophils Relative: 3 %
HCT: 43 % (ref 36.0–46.0)
Hemoglobin: 13.2 g/dL (ref 12.0–15.0)
Immature Granulocytes: 0 %
Lymphocytes Relative: 37 %
Lymphs Abs: 2.7 10*3/uL (ref 0.7–4.0)
MCH: 24.3 pg — ABNORMAL LOW (ref 26.0–34.0)
MCHC: 30.7 g/dL (ref 30.0–36.0)
MCV: 79.2 fL — ABNORMAL LOW (ref 80.0–100.0)
Monocytes Absolute: 0.8 10*3/uL (ref 0.1–1.0)
Monocytes Relative: 12 %
Neutro Abs: 3.3 10*3/uL (ref 1.7–7.7)
Neutrophils Relative %: 48 %
Platelets: 249 10*3/uL (ref 150–400)
RBC: 5.43 MIL/uL — ABNORMAL HIGH (ref 3.87–5.11)
RDW: 21.8 % — ABNORMAL HIGH (ref 11.5–15.5)
WBC: 7.1 10*3/uL (ref 4.0–10.5)
nRBC: 0 % (ref 0.0–0.2)

## 2019-12-08 MED ORDER — SODIUM CHLORIDE 0.9 % IV SOLN
800.0000 mg/m2 | Freq: Once | INTRAVENOUS | Status: AC
  Administered 2019-12-08: 1786 mg via INTRAVENOUS
  Filled 2019-12-08: qty 26.3

## 2019-12-08 MED ORDER — HEPARIN SOD (PORK) LOCK FLUSH 100 UNIT/ML IV SOLN
500.0000 [IU] | Freq: Once | INTRAVENOUS | Status: AC | PRN
  Administered 2019-12-08: 500 [IU]

## 2019-12-08 MED ORDER — PACLITAXEL PROTEIN-BOUND CHEMO INJECTION 100 MG
75.0000 mg/m2 | Freq: Once | INTRAVENOUS | Status: AC
  Administered 2019-12-08: 175 mg via INTRAVENOUS
  Filled 2019-12-08: qty 35

## 2019-12-08 MED ORDER — SODIUM CHLORIDE 0.9 % IV SOLN
Freq: Once | INTRAVENOUS | Status: AC
  Filled 2019-12-08: qty 4

## 2019-12-08 MED ORDER — SODIUM CHLORIDE 0.9 % IV SOLN: Freq: Once | INTRAVENOUS | Status: AC

## 2019-12-08 MED ORDER — SODIUM CHLORIDE 0.9% FLUSH
10.0000 mL | INTRAVENOUS | Status: DC | PRN
  Administered 2019-12-08: 10 mL

## 2019-12-08 NOTE — Patient Instructions (Signed)
[facility] at [facility] Discharge Instructions  You were seen today by Dr. Katragadda. He went over your recent lab results. He will see you back in 2 weeks for labs, treatment and follow up.   Thank you for choosing [facility] at [facility] to provide your oncology and hematology care.  To afford each patient quality time with our provider, please arrive at least 15 minutes before your scheduled appointment time.   If you have a lab appointment with the Cancer Center please come in thru the  Main Entrance and check in at the main information desk  You need to re-schedule your appointment should you arrive 10 or more minutes late.  We strive to give you quality time with our providers, and arriving late affects you and other patients whose appointments are after yours.  Also, if you no show three or more times for appointments you may be dismissed from the clinic at the providers discretion.     Again, thank you for choosing [facility].  Our hope is that these requests will decrease the amount of time that you wait before being seen by our physicians.       _____________________________________________________________  Should you have questions after your visit to [facility], please contact our office at [phone] between the hours of 8:00 a.m. and 4:30 p.m.  Voicemails left after 4:00 p.m. will not be returned until the following business day.  For prescription refill requests, have your pharmacy contact our office and allow 72 hours.    Cancer Center Support Programs:   > Cancer Support Group  2nd Tuesday of the month 1pm-2pm, Journey Room    

## 2019-12-08 NOTE — Progress Notes (Signed)
Patient has been assessed, vital signs and labs have been reviewed by Dr. Katragadda. ANC, Creatinine, LFTs, and Platelets are within treatment parameters per Dr. Katragadda. The patient is good to proceed with treatment at this time.  

## 2019-12-08 NOTE — Patient Instructions (Signed)
[facility] Discharge Instructions for Patients Receiving Chemotherapy   Beginning January 23rd 2017 lab work for the Cancer Center will be done in the  Main lab at [facility] on 1st floor. If you have a lab appointment with the Cancer Center please come in thru the  Main Entrance and check in at the main information desk   Today you received the following chemotherapy agents Abraxane and Gemzar. Follow-up as scheduled. Call clinic for any questions or concerns  To help prevent nausea and vomiting after your treatment, we encourage you to take your nausea medication   If you develop nausea and vomiting, or diarrhea that is not controlled by your medication, call the clinic.  The clinic phone number is [phone]. Office hours are Monday-Friday 8:30am-5:00pm.  BELOW ARE SYMPTOMS THAT SHOULD BE REPORTED IMMEDIATELY:  *FEVER GREATER THAN 101.0 F  *CHILLS WITH OR WITHOUT FEVER  NAUSEA AND VOMITING THAT IS NOT CONTROLLED WITH YOUR NAUSEA MEDICATION  *UNUSUAL SHORTNESS OF BREATH  *UNUSUAL BRUISING OR BLEEDING  TENDERNESS IN MOUTH AND THROAT WITH OR WITHOUT PRESENCE OF ULCERS  *URINARY PROBLEMS  *BOWEL PROBLEMS  UNUSUAL RASH Items with * indicate a potential emergency and should be followed up as soon as possible. If you have an emergency after office hours please contact your primary care physician or go to the nearest emergency department.  Please call the clinic during office hours if you have any questions or concerns.   You may also contact the Patient Navigator at [phone] should you have any questions or need assistance in obtaining follow up care.      Resources For Cancer Patients and their Caregivers ? American Cancer Society: Can assist with transportation, wigs, general needs, runs Look Good Feel Better.        1-888-227-6333 ? Cancer Care: Provides financial assistance, online support groups, medication/co-pay assistance.   1-800-813-HOPE (4673) ? [facility] Assists [place] cancer patients and their families through emotional , educational and financial support.  336-427-4357 ? [place] DSS Where to apply for food stamps, Medicaid and utility assistance. 336-342-1394 ? [facility]: Transportation to medical appointments. 336-347-2287 ? Social Security Administration: May apply for disability if have a Stage IV cancer. 336-342-7796 1-800-772-1213 ? [place] Aging, Disability and Transit Services: Assists with nutrition, care and transit needs. 336-349-2343         

## 2019-12-08 NOTE — Assessment & Plan Note (Signed)
1.  Metastatic pancreatic cancer to the liver: -6 cycles of gemcitabine and Abraxane on day 1 and day 15 from 05/17/2019 through 10/25/2019. -CT AP after cycle 4 on 08/29/2019 showed improvement. -Cycle 7-day 1 on 11/23/2019. -I reviewed her CBC today.  White count is 7.1 with normal platelet count.  LFTs are normal.  Creatinine is 0.8. -She will proceed with day 15 cycle 7 today.  No dose modifications. -She will come back in 2 weeks for follow-up.  She wants to have her cycle 8, 2 days before her schedule. -I plan to repeat CT CAP after cycle 8.  2.  Peripheral neuropathy: -She will continue gabapentin 300 mg 3 times a day.  She has numbness but denies any worsening.  3.  Constipation: -She will continue Colace 200 mg at bedtime.  She will use lactulose as needed.  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  This was incidentally found on the most recent CT scan.  We will consider anticoagulation if any abdominal pain.  5.  Left ear pain: -This has resolved after antibiotic Augmentin.

## 2019-12-08 NOTE — Patient Instructions (Signed)
[facility] at [facility] Discharge Instructions  Labs drawn from portacath today   Thank you for choosing [facility] at [facility] to provide your oncology and hematology care.  To afford each patient quality time with our provider, please arrive at least 15 minutes before your scheduled appointment time.   If you have a lab appointment with the Cancer Center please come in thru the Main Entrance and check in at the main information desk.  You need to re-schedule your appointment should you arrive 10 or more minutes late.  We strive to give you quality time with our providers, and arriving late affects you and other patients whose appointments are after yours.  Also, if you no show three or more times for appointments you may be dismissed from the clinic at the providers discretion.     Again, thank you for choosing [facility].  Our hope is that these requests will decrease the amount of time that you wait before being seen by our physicians.       _____________________________________________________________  Should you have questions after your visit to  Cancer Center, please contact our office at [phone] between the hours of 8:00 a.m. and 4:30 p.m.  Voicemails left after 4:00 p.m. will not be returned until the following business day.  For prescription refill requests, have your pharmacy contact our office and allow 72 hours.    Due to Covid, you will need to wear a mask upon entering the hospital. If you do not have a mask, a mask will be given to you at the Main Entrance upon arrival. For doctor visits, patients may have 1 support person with them. For treatment visits, patients can not have anyone with them due to social distancing guidelines and our immunocompromised population.     

## 2019-12-08 NOTE — Progress Notes (Signed)
[id number] Labs reviewed with and pt seen by Dr. Delton Coombes and pt approved for Abraxane and Gemzar infusions today per MD         [name] tolerated chemo tx well without complaints or incident. VSS upon discharge. Pt discharged self ambulatory in satisfactory condition

## 2019-12-08 NOTE — Progress Notes (Signed)
 [facility] [address] [place], [place] [address]   CLINIC:  Medical Oncology/Hematology  PCP:  Spahn, Kreig A, DO [address] [place] [address] [phone]   REASON FOR VISIT:  Follow-up for metastatic pancreatic cancer to the liver.   BRIEF ONCOLOGIC HISTORY:  Oncology History  Pancreatic cancer metastasized to liver (HCC)  05/09/2019 Initial Diagnosis   Pancreatic cancer metastasized to liver (HCC)   05/17/2019 -  Chemotherapy   The patient had PACLitaxel-protein bound (ABRAXANE) chemo infusion 275 mg, 125 mg/m2 = 275 mg, Intravenous,  Once, 7 of 8 cycles Dose modification: 100 mg/m2 (80 % of original dose 125 mg/m2, Cycle 1, Reason: Other (see comments), Comment: neutropenia), 93.75 mg/m2 (75 % of original dose 125 mg/m2, Cycle 1, Reason: Other (see comments), Comment: neutropenia), 100 mg/m2 (80 % of original dose 125 mg/m2, Cycle 2, Reason: Other (see comments), Comment: cytopenias with previous treatment), 75 mg/m2 (original dose 125 mg/m2, Cycle 3, Reason: Provider Judgment, Comment: neuropathy stated as 10/10 dose reduce required per NP Kim Nester) Administration: 275 mg (05/17/2019), 225 mg (05/24/2019), 200 mg (05/31/2019), 225 mg (06/14/2019), 225 mg (06/28/2019), 225 mg (07/12/2019), 175 mg (07/19/2019), 175 mg (08/16/2019), 175 mg (08/31/2019), 175 mg (09/27/2019), 175 mg (10/11/2019), 175 mg (10/25/2019), 175 mg (11/08/2019), 175 mg (11/23/2019), 175 mg (12/08/2019) ondansetron (ZOFRAN) 8 mg in sodium chloride 0.9 % 50 mL IVPB, 8 mg (100 % of original dose 8 mg), Intravenous,  Once, 1 of 1 cycle Dose modification: 8 mg (original dose 8 mg, Cycle 1) gemcitabine (GEMZAR) 2,200 mg in sodium chloride 0.9 % 250 mL chemo infusion, 2,204 mg, Intravenous,  Once, 7 of 8 cycles Dose modification: 750 mg/m2 (75 % of original dose 1,000 mg/m2, Cycle 1, Reason: Other (see comments), Comment: neutropenia), 750 mg/m2 (75 % of original dose 1,000 mg/m2, Cycle 1, Reason: Other  (see comments), Comment: neutropenia), 800 mg/m2 (80 % of original dose 1,000 mg/m2, Cycle 2, Reason: Other (see comments), Comment: cytopenia) Administration: 2,200 mg (05/17/2019), 1,600 mg (05/24/2019), 1,600 mg (05/31/2019), 1,786 mg (06/14/2019), 1,786 mg (06/28/2019), 1,786 mg (07/12/2019), 1,786 mg (07/19/2019), 1,786 mg (08/16/2019), 1,786 mg (08/31/2019), 1,786 mg (09/27/2019), 1,786 mg (10/11/2019), 1,786 mg (10/25/2019), 1,786 mg (11/08/2019), 1,786 mg (11/23/2019), 1,786 mg (12/08/2019) ondansetron (ZOFRAN) 8 mg, dexamethasone (DECADRON) 10 mg in sodium chloride 0.9 % 50 mL IVPB, , Intravenous,  Once, 7 of 8 cycles Administration:  (05/17/2019),  (05/24/2019),  (05/31/2019),  (06/14/2019),  (06/28/2019),  (07/12/2019),  (07/19/2019),  (08/16/2019),  (08/31/2019),  (09/27/2019),  (10/11/2019),  (10/25/2019),  (11/08/2019),  (11/23/2019),  (12/08/2019)  for chemotherapy treatment.    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  The Common Hereditary Gene Panel offered by [facility] includes sequencing and/or deletion duplication testing of the following 48 genes: APC, ATM, AXIN2, BARD1, BMPR1A, BRCA1, BRCA2, BRIP1, CDH1, CDK4, CDKN2A (p14ARF), CDKN2A (p16INK4a), CHEK2, CTNNA1, DICER1, EPCAM (Deletion/duplication testing only), GREM1 (promoter region deletion/duplication testing only), KIT, MEN1, MLH1, MSH2, MSH3, MSH6, MUTYH, NBN, NF1, NHTL1, PALB2, PDGFRA, PMS2, POLD1, POLE, PTEN, RAD50, RAD51C, RAD51D, RNF43, SDHB, SDHC, SDHD, SMAD4, SMARCA4. STK11, TP53, TSC1, TSC2, and VHL.  The following genes were evaluated for sequence changes only: SDHA and HOXB13 c.251G>A variant only. The report date is August 02, 2019.      CANCER STAGING: Cancer Staging No matching staging information was found for the patient.   INTERVAL HISTORY:  Ms. Huron 59 y.o. female seen for follow-up and toxicity   assessment prior to cycle 7-day 15 of chemotherapy.  She  reported that her ear pain has improved from last visit.  We have treated it with Augmentin.  Appetite is 100%.  Energy levels are 75%.  Numbness in the feet has been stable.  Shortness of breath on exertion is also stable.  She is able to eat well and maintain her weight.  Constipation is well controlled.  REVIEW OF SYSTEMS:  Review of Systems  Respiratory: Positive for shortness of breath.   Gastrointestinal: Positive for constipation.  Neurological: Positive for numbness.  All other systems reviewed and are negative.    PAST MEDICAL/SURGICAL HISTORY:  Past Medical History:  Diagnosis Date  . Diabetes mellitus without complication (HCC)   . Family history of prostate cancer   . Family history of stomach cancer   . Hypertension   . Pancreatic cancer (HCC)   . Port-A-Cath in place 05/16/2019   Past Surgical History:  Procedure Laterality Date  . ABDOMINAL HYSTERECTOMY    . BACK SURGERY    . KIDNEY SURGERY     per pt, had leakage  . tubal ligation Left      SOCIAL HISTORY:  Social History   Socioeconomic History  . Marital status: Divorced    Spouse name: Not on file  . Number of children: 1  . Years of education: Not on file  . Highest education level: Not on file  Occupational History  . Occupation: disabled  Tobacco Use  . Smoking status: Current Every Day Smoker    Packs/day: 1.00    Years: 45.00    Pack years: 45.00    Types: Cigarettes  . Smokeless tobacco: Never Used  Substance and Sexual Activity  . Alcohol use: Not Currently  . Drug use: Never  . Sexual activity: Not on file  Other Topics Concern  . Not on file  Social History Narrative  . Not on file   Social Determinants of Health   Financial Resource Strain: Low Risk   . Difficulty of Paying Living Expenses: Not hard at all  Food Insecurity: No Food Insecurity  . Worried About Running Out of Food in the Last Year: Never true  . Ran Out of Food in the Last Year: Never true  Transportation  Needs: Unmet Transportation Needs  . Lack of Transportation (Medical): Yes  . Lack of Transportation (Non-Medical): Yes  Physical Activity: Inactive  . Days of Exercise per Week: 0 days  . Minutes of Exercise per Session: 0 min  Stress: No Stress Concern Present  . Feeling of Stress : Not at all  Social Connections: Somewhat Isolated  . Frequency of Communication with Friends and Family: More than three times a week  . Frequency of Social Gatherings with Friends and Family: More than three times a week  . Attends Religious Services: 1 to 4 times per year  . Active Member of Clubs or Organizations: No  . Attends Club or Organization Meetings: Never  . Marital Status: Divorced  Intimate Partner Violence: Not At Risk  . Fear of Current or Ex-Partner: No  . Emotionally Abused: No  . Physically Abused: No  . Sexually Abused: No    FAMILY HISTORY:  Family History  Problem Relation Age of Onset  . Diabetes Mother   . Hypertension Mother   . Lung cancer Mother 76       d. 79  . Diabetes Father   . Hypertension Father   . Heart disease Brother   .   Stomach cancer Paternal Aunt 67  . Stroke Paternal Grandmother   . Prostate cancer Other        PGMs brother  . Stomach cancer Other        PGMs mother    CURRENT MEDICATIONS:  Outpatient Encounter Medications as of 12/08/2019  Medication Sig  . amLODipine (NORVASC) 5 MG tablet Take 1 tablet (5 mg total) by mouth 2 (two) times daily.  Marland Kitchen amoxicillin-clavulanate (AUGMENTIN) 875-125 MG tablet Take 1 tablet by mouth 2 (two) times daily. (Patient not taking: Reported on 12/08/2019)  . docusate sodium (COLACE) 100 MG capsule Take 2 capsules (200 mg total) by mouth at bedtime.  . Fluticasone-Salmeterol (ADVAIR DISKUS) 250-50 MCG/DOSE AEPB Inhale 1 puff into the lungs 2 (two) times daily.  Marland Kitchen gabapentin (NEURONTIN) 300 MG capsule TAKE 1 CAPSULE(300 MG) BY MOUTH THREE TIMES DAILY  . Gemcitabine HCl (GEMZAR IV) Inject into the vein. Days 1, 8, 15  q 28 days  . insulin aspart (NOVOLOG) 100 UNIT/ML injection Inject 20 Units into the skin 3 (three) times daily before meals.  . Insulin Syringe-Needle U-100 (INSULIN SYRINGE 1CC/31GX5/16") 31G X 5/16" 1 ML MISC   . Lactulose 20 GM/30ML SOLN Take 30 mLs (20 g total) by mouth at bedtime as needed.  . lidocaine-prilocaine (EMLA) cream Apply small amount to port a cath site and cover with plastic wrap 1 hour prior to chemotherapy appointments  . linaclotide (LINZESS) 145 MCG CAPS capsule Take 1 capsule (145 mcg total) by mouth daily before breakfast.  . lisinopril (ZESTRIL) 40 MG tablet Take 1 tablet (40 mg total) by mouth daily.  Marland Kitchen NEEDLE, DISP, 30 G (BD DISP NEEDLES) 30G X 1/2" MISC Use as directed with insulin three times daily  . ondansetron (ZOFRAN) 4 MG tablet Take 1 tablet (4 mg total) by mouth 3 (three) times daily.  . Oxycodone HCl 10 MG TABS Take 1 tablet (10 mg total) by mouth every 6 (six) hours as needed.  Marland Kitchen oxyCODONE-acetaminophen (PERCOCET) 10-325 MG tablet Take 2 tablets by mouth daily.  . OXYCONTIN 40 MG 12 hr tablet Take 40 mg by mouth every 12 (twelve) hours.   Marland Kitchen PACLitaxel Protein-Bound Part (ABRAXANE IV) Inject into the vein. Days 1, 8, 15 q 28 days  . prochlorperazine (COMPAZINE) 10 MG tablet Take 1 tablet (10 mg total) by mouth every 6 (six) hours as needed (Nausea or vomiting). (Patient not taking: Reported on 10/25/2019)   No facility-administered encounter medications on file as of 12/08/2019.    ALLERGIES:  Allergies  Allergen Reactions  . Hydrocodone Hives  . Tramadol Itching     PHYSICAL EXAM:  ECOG Performance status: 1  Vitals:   12/08/19 0819  BP: 125/74  Pulse: 98  Resp: 18  Temp: 98.6 F (37 C)  SpO2: 100%   Filed Weights   12/08/19 0819  Weight: 240 lb 6.4 oz (109 kg)    Physical Exam Vitals reviewed.  Constitutional:      Appearance: Normal appearance.  Cardiovascular:     Rate and Rhythm: Normal rate and regular rhythm.     Heart  sounds: Normal heart sounds.  Pulmonary:     Effort: Pulmonary effort is normal.     Breath sounds: Normal breath sounds.  Abdominal:     General: There is no distension.     Palpations: Abdomen is soft. There is no mass.  Musculoskeletal:        General: No swelling.  Lymphadenopathy:     Cervical: No  cervical adenopathy.  Skin:    General: Skin is warm.  Neurological:     General: No focal deficit present.     Mental Status: She is alert and oriented to person, place, and time.  Psychiatric:        Mood and Affect: Mood normal.        Behavior: Behavior normal.     LABORATORY DATA:  I have reviewed the labs as listed.  CBC    Component Value Date/Time   WBC 7.1 12/08/2019 0818   RBC 5.43 (H) 12/08/2019 0818   HGB 13.2 12/08/2019 0818   HCT 43.0 12/08/2019 0818   PLT 249 12/08/2019 0818   MCV 79.2 (L) 12/08/2019 0818   MCH 24.3 (L) 12/08/2019 0818   MCHC 30.7 12/08/2019 0818   RDW 21.8 (H) 12/08/2019 0818   LYMPHSABS 2.7 12/08/2019 0818   MONOABS 0.8 12/08/2019 0818   EOSABS 0.2 12/08/2019 0818   BASOSABS 0.0 12/08/2019 0818   CMP Latest Ref Rng & Units 12/08/2019 11/23/2019 11/08/2019  Glucose 70 - 99 mg/dL 243(H) 216(H) 236(H)  BUN 6 - 20 mg/dL _0 Creatinine 0.44 - 1.00 mg/dL 0.80 0.69 0.59  Sodium 135 - 145 mmol/L 136 135 136  Potassium 3.5 - 5.1 mmol/L 3.4(L) 3.8 3.6  Chloride 98 - 111 mmol/L 96(L) 98 100  CO2 22 - 32 mmol/L _1 Calcium 8.9 - 10.3 mg/dL 9.1 8.9 8.9  Total Protein 6.5 - 8.1 g/dL 7.2 6.7 6.7  Total Bilirubin 0.3 - 1.2 mg/dL 0.3 0.4 0.1(L)  Alkaline Phos 38 - 126 U/L 90 80 78  AST 15 - 41 U/L _2 ALT 0 - 44 U/L _3 DIAGNOSTIC IMAGING:  I have independently reviewed the scan.   I have reviewed Venita Lick LPN's note and agree with the documentation.  I personally performed a face-to-face visit, made revisions and my assessment and plan is as follows.    ASSESSMENT & PLAN:   Pancreatic cancer  metastasized to liver ([place]) 1.  Metastatic pancreatic cancer to the liver: -6 cycles of gemcitabine and Abraxane on day 1 and day 15 from 05/17/2019 through 10/25/2019. -CT AP after cycle 4 on 08/29/2019 showed improvement. -Cycle 7-day 1 on 11/23/2019. -I reviewed her CBC today.  White count is 7.1 with normal platelet count.  LFTs are normal.  Creatinine is 0.8. -She will proceed with day 15 cycle 7 today.  No dose modifications. -She will come back in 2 weeks for follow-up.  She wants to have her cycle 8, 2 days before her schedule. -I plan to repeat CT CAP after cycle 8.  2.  Peripheral neuropathy: -She will continue gabapentin 300 mg 3 times a day.  She has numbness but denies any worsening.  3.  Constipation: -She will continue Colace 200 mg at bedtime.  She will use lactulose as needed.  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  This was incidentally found on the most recent CT scan.  We will consider anticoagulation if any abdominal pain.  5.  Left ear pain: -This has resolved after antibiotic Augmentin.      Orders placed this encounter:  No orders of the defined types were placed in this encounter.     Derek Jack, MD [place] [phone]

## 2019-12-20 ENCOUNTER — Ambulatory Visit (HOSPITAL_COMMUNITY): Payer: Medicare HMO | Admitting: Hematology

## 2019-12-20 ENCOUNTER — Ambulatory Visit (HOSPITAL_COMMUNITY): Payer: Medicare HMO

## 2019-12-20 ENCOUNTER — Other Ambulatory Visit (HOSPITAL_COMMUNITY): Payer: Medicare HMO

## 2019-12-22 NOTE — Progress Notes (Signed)

## 2019-12-28 ENCOUNTER — Inpatient Hospital Stay (HOSPITAL_COMMUNITY): Payer: Medicare HMO

## 2019-12-28 ENCOUNTER — Encounter (HOSPITAL_COMMUNITY): Payer: Self-pay | Admitting: Hematology

## 2019-12-28 ENCOUNTER — Other Ambulatory Visit: Payer: Self-pay

## 2019-12-28 ENCOUNTER — Inpatient Hospital Stay (HOSPITAL_BASED_OUTPATIENT_CLINIC_OR_DEPARTMENT_OTHER): Payer: Medicare HMO | Admitting: Hematology

## 2019-12-28 VITALS — BP 130/66 | HR 83 | Temp 97.1°F | Resp 18

## 2019-12-28 DIAGNOSIS — Z95828 Presence of other vascular implants and grafts: Secondary | ICD-10-CM

## 2019-12-28 DIAGNOSIS — C259 Malignant neoplasm of pancreas, unspecified: Secondary | ICD-10-CM | POA: Diagnosis not present

## 2019-12-28 DIAGNOSIS — C787 Secondary malignant neoplasm of liver and intrahepatic bile duct: Secondary | ICD-10-CM | POA: Diagnosis not present

## 2019-12-28 DIAGNOSIS — Z5111 Encounter for antineoplastic chemotherapy: Secondary | ICD-10-CM | POA: Diagnosis not present

## 2019-12-28 LAB — CBC WITH DIFFERENTIAL/PLATELET
Abs Immature Granulocytes: 0.04 10*3/uL (ref 0.00–0.07)
Basophils Absolute: 0 10*3/uL (ref 0.0–0.1)
Basophils Relative: 0 %
Eosinophils Absolute: 0.2 10*3/uL (ref 0.0–0.5)
Eosinophils Relative: 2 %
HCT: 38.1 % (ref 36.0–46.0)
Hemoglobin: 11.7 g/dL — ABNORMAL LOW (ref 12.0–15.0)
Immature Granulocytes: 1 %
Lymphocytes Relative: 31 %
Lymphs Abs: 2.4 10*3/uL (ref 0.7–4.0)
MCH: 24.9 pg — ABNORMAL LOW (ref 26.0–34.0)
MCHC: 30.7 g/dL (ref 30.0–36.0)
MCV: 81.1 fL (ref 80.0–100.0)
Monocytes Absolute: 1.1 10*3/uL — ABNORMAL HIGH (ref 0.1–1.0)
Monocytes Relative: 14 %
Neutro Abs: 4 10*3/uL (ref 1.7–7.7)
Neutrophils Relative %: 52 %
Platelets: 275 10*3/uL (ref 150–400)
RBC: 4.7 MIL/uL (ref 3.87–5.11)
RDW: 21.8 % — ABNORMAL HIGH (ref 11.5–15.5)
WBC: 7.7 10*3/uL (ref 4.0–10.5)
nRBC: 0 % (ref 0.0–0.2)

## 2019-12-28 LAB — COMPREHENSIVE METABOLIC PANEL
ALT: 18 U/L (ref 0–44)
AST: 20 U/L (ref 15–41)
Albumin: 3.6 g/dL (ref 3.5–5.0)
Alkaline Phosphatase: 87 U/L (ref 38–126)
Anion gap: 11 (ref 5–15)
BUN: 14 mg/dL (ref 6–20)
CO2: 29 mmol/L (ref 22–32)
Calcium: 9 mg/dL (ref 8.9–10.3)
Chloride: 96 mmol/L — ABNORMAL LOW (ref 98–111)
Creatinine, Ser: 0.86 mg/dL (ref 0.44–1.00)
GFR calc Af Amer: >60 mL/min (ref >60)
GFR calc non Af Amer: >60 mL/min (ref >60)
Glucose, Bld: 277 mg/dL — ABNORMAL HIGH (ref 70–99)
Potassium: 3.8 mmol/L (ref 3.5–5.1)
Sodium: 136 mmol/L (ref 135–145)
Total Bilirubin: 0.5 mg/dL (ref 0.3–1.2)
Total Protein: 6.2 g/dL — ABNORMAL LOW (ref 6.5–8.1)

## 2019-12-28 MED ORDER — PACLITAXEL PROTEIN-BOUND CHEMO INJECTION 100 MG
75.0000 mg/m2 | Freq: Once | INTRAVENOUS | Status: AC
  Administered 2019-12-28: 175 mg via INTRAVENOUS
  Filled 2019-12-28: qty 35

## 2019-12-28 MED ORDER — SODIUM CHLORIDE 0.9 % IV SOLN
Freq: Once | INTRAVENOUS | Status: AC
  Filled 2019-12-28: qty 4

## 2019-12-28 MED ORDER — HEPARIN SOD (PORK) LOCK FLUSH 100 UNIT/ML IV SOLN
500.0000 [IU] | Freq: Once | INTRAVENOUS | Status: AC | PRN
  Administered 2019-12-28: 500 [IU]

## 2019-12-28 MED ORDER — SODIUM CHLORIDE 0.9 % IV SOLN: Freq: Once | INTRAVENOUS | Status: AC

## 2019-12-28 MED ORDER — SODIUM CHLORIDE 0.9 % IV SOLN
800.0000 mg/m2 | Freq: Once | INTRAVENOUS | Status: AC
  Administered 2019-12-28: 1786 mg via INTRAVENOUS
  Filled 2019-12-28: qty 26.3

## 2019-12-28 MED ORDER — LEVOFLOXACIN 500 MG PO TABS: 500.0000 mg | ORAL_TABLET | Freq: Every day | ORAL | 0 refills | Status: DC

## 2019-12-28 MED ORDER — SODIUM CHLORIDE 0.9% FLUSH
10.0000 mL | INTRAVENOUS | Status: DC | PRN
  Administered 2019-12-28: 10 mL

## 2019-12-28 NOTE — Progress Notes (Signed)
[facility], [place] [address]   CLINIC:  Medical Oncology/Hematology  PCP:  Merdis Delay, DO [address] [place] [address] [phone]   REASON FOR VISIT:  Follow-up for metastatic pancreatic cancer to the liver.   BRIEF ONCOLOGIC HISTORY:  Oncology History  Pancreatic cancer metastasized to liver ([place])  05/09/2019 Initial Diagnosis   Pancreatic cancer metastasized to liver ([place])   05/17/2019 -  Chemotherapy   The patient had PACLitaxel-protein bound (ABRAXANE) chemo infusion 275 mg, 125 mg/m2 = 275 mg, Intravenous,  Once, 8 of 9 cycles Dose modification: 100 mg/m2 (80 % of original dose 125 mg/m2, Cycle 1, Reason: Other (see comments), Comment: neutropenia), 93.75 mg/m2 (75 % of original dose 125 mg/m2, Cycle 1, Reason: Other (see comments), Comment: neutropenia), 100 mg/m2 (80 % of original dose 125 mg/m2, Cycle 2, Reason: Other (see comments), Comment: cytopenias with previous treatment), 75 mg/m2 (original dose 125 mg/m2, Cycle 3, Reason: Provider Judgment, Comment: neuropathy stated as 10/10 dose reduce required per NP Reynolds Bowl) Administration: 275 mg (05/17/2019), 225 mg (05/24/2019), 200 mg (05/31/2019), 225 mg (06/14/2019), 225 mg (06/28/2019), 225 mg (07/12/2019), 175 mg (07/19/2019), 175 mg (08/16/2019), 175 mg (08/31/2019), 175 mg (09/27/2019), 175 mg (10/11/2019), 175 mg (10/25/2019), 175 mg (11/08/2019), 175 mg (11/23/2019), 175 mg (12/08/2019), 175 mg (12/28/2019) ondansetron (ZOFRAN) 8 mg in sodium chloride 0.9 % 50 mL IVPB, 8 mg (100 % of original dose 8 mg), Intravenous,  Once, 1 of 1 cycle Dose modification: 8 mg (original dose 8 mg, Cycle 1) gemcitabine (GEMZAR) 2,200 mg in sodium chloride 0.9 % 250 mL chemo infusion, 2,204 mg, Intravenous,  Once, 8 of 9 cycles Dose modification: 750 mg/m2 (75 % of original dose 1,000 mg/m2, Cycle 1, Reason: Other (see comments), Comment: neutropenia), 750 mg/m2 (75 % of original dose 1,000 mg/m2, Cycle  1, Reason: Other (see comments), Comment: neutropenia), 800 mg/m2 (80 % of original dose 1,000 mg/m2, Cycle 2, Reason: Other (see comments), Comment: cytopenia) Administration: 2,200 mg (05/17/2019), 1,600 mg (05/24/2019), 1,600 mg (05/31/2019), 1,786 mg (06/14/2019), 1,786 mg (06/28/2019), 1,786 mg (07/12/2019), 1,786 mg (07/19/2019), 1,786 mg (08/16/2019), 1,786 mg (08/31/2019), 1,786 mg (09/27/2019), 1,786 mg (10/11/2019), 1,786 mg (10/25/2019), 1,786 mg (11/08/2019), 1,786 mg (11/23/2019), 1,786 mg (12/08/2019), 1,786 mg (12/28/2019) ondansetron (ZOFRAN) 8 mg, dexamethasone (DECADRON) 10 mg in sodium chloride 0.9 % 50 mL IVPB, , Intravenous,  Once, 8 of 9 cycles Administration:  (05/17/2019),  (05/24/2019),  (05/31/2019),  (06/14/2019),  (06/28/2019),  (07/12/2019),  (07/19/2019),  (08/16/2019),  (08/31/2019),  (09/27/2019),  (10/11/2019),  (10/25/2019),  (11/08/2019),  (11/23/2019),  (12/08/2019),  (12/28/2019)  for chemotherapy treatment.    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  The Common Hereditary Gene Panel offered by [facility] includes sequencing and/or deletion duplication testing of the following 48 genes: APC, ATM, AXIN2, BARD1, BMPR1A, BRCA1, BRCA2, BRIP1, CDH1, CDK4, CDKN2A (p14ARF), CDKN2A (p16INK4a), CHEK2, CTNNA1, DICER1, EPCAM (Deletion/duplication testing only), GREM1 (promoter region deletion/duplication testing only), KIT, MEN1, MLH1, MSH2, MSH3, MSH6, MUTYH, NBN, NF1, NHTL1, PALB2, PDGFRA, PMS2, POLD1, POLE, PTEN, RAD50, RAD51C, RAD51D, RNF43, SDHB, SDHC, SDHD, SMAD4, SMARCA4. STK11, TP53, TSC1, TSC2, and VHL.  The following genes were evaluated for sequence changes only: SDHA and HOXB13 c.251G>A variant only. The report date is August 02, 2019.      CANCER STAGING: Cancer Staging No matching staging information was found for the patient.   INTERVAL HISTORY:  Ms. Vecchiarelli  59 y.o. female seen for follow-up and toxicity assessment  prior to cycle 8-day 1.  Reported appetite of 50%.  Energy levels are low.  Pain in the low back reported as 10 out of 10.  Numbness in the feet is stable.  Shortness of breath on exertion present.  She is drinking Ensure daily.  Reported new onset cough which is associated with yellowish to black sputum.  Denies any fevers or chills.  She is using inhaler as needed for wheezing.  REVIEW OF SYSTEMS:  Review of Systems  Respiratory: Positive for shortness of breath.   Gastrointestinal: Positive for constipation.  Neurological: Positive for numbness.  All other systems reviewed and are negative.    PAST MEDICAL/SURGICAL HISTORY:  Past Medical History:  Diagnosis Date  . Diabetes mellitus without complication ([facility] [place])   . Family history of prostate cancer   . Family history of stomach cancer   . Hypertension   . Pancreatic cancer ([place])   . Port-A-Cath in place 05/16/2019   Past Surgical History:  Procedure Laterality Date  . ABDOMINAL HYSTERECTOMY    . BACK SURGERY    . KIDNEY SURGERY     per pt, had leakage  . tubal ligation Left      SOCIAL HISTORY:  Social History   Socioeconomic History  . Marital status: Divorced    Spouse name: Not on file  . Number of children: 1  . Years of education: Not on file  . Highest education level: Not on file  Occupational History  . Occupation: disabled  Tobacco Use  . Smoking status: Current Every Day Smoker    Packs/day: 1.00    Years: 45.00    Pack years: 45.00    Types: Cigarettes  . Smokeless tobacco: Never Used  Substance and Sexual Activity  . Alcohol use: Not Currently  . Drug use: Never  . Sexual activity: Not on file  Other Topics Concern  . Not on file  Social History Narrative  . Not on file   Social Determinants of Health   Financial Resource Strain: Low Risk   . Difficulty of Paying Living Expenses: Not hard at all  Food Insecurity: No Food Insecurity  . Worried About Charity fundraiser in the Last Year: Never  true  . Ran Out of Food in the Last Year: Never true  Transportation Needs: Unmet Transportation Needs  . Lack of Transportation (Medical): Yes  . Lack of Transportation (Non-Medical): Yes  Physical Activity: Inactive  . Days of Exercise per Week: 0 days  . Minutes of Exercise per Session: 0 min  Stress: No Stress Concern Present  . Feeling of Stress : Not at all  Social Connections: Somewhat Isolated  . Frequency of Communication with Friends and Family: More than three times a week  . Frequency of Social Gatherings with Friends and Family: More than three times a week  . Attends Religious Services: 1 to 4 times per year  . Active Member of Clubs or Organizations: No  . Attends Archivist Meetings: Never  . Marital Status: Divorced  Human resources officer Violence: Not At Risk  . Fear of Current or Ex-Partner: No  . Emotionally Abused: No  . Physically Abused: No  . Sexually Abused: No    FAMILY HISTORY:  Family History  Problem Relation Age of Onset  . Diabetes Mother   . Hypertension Mother   . Lung cancer Mother 58       d. 49  . Diabetes  Father   . Hypertension Father   . Heart disease Brother   . Stomach cancer Paternal Aunt 73  . Stroke Paternal Grandmother   . Prostate cancer Other        PGMs brother  . Stomach cancer Other        PGMs mother    CURRENT MEDICATIONS:  Outpatient Encounter Medications as of 12/28/2019  Medication Sig  . amLODipine (NORVASC) 5 MG tablet Take 1 tablet (5 mg total) by mouth 2 (two) times daily.  Marland Kitchen docusate sodium (COLACE) 100 MG capsule Take 2 capsules (200 mg total) by mouth at bedtime.  . Fluticasone-Salmeterol (ADVAIR DISKUS) 250-50 MCG/DOSE AEPB Inhale 1 puff into the lungs 2 (two) times daily.  Marland Kitchen gabapentin (NEURONTIN) 300 MG capsule TAKE 1 CAPSULE(300 MG) BY MOUTH THREE TIMES DAILY  . Gemcitabine HCl (GEMZAR IV) Inject into the vein. Days 1, 8, 15 q 28 days  . insulin aspart (NOVOLOG) 100 UNIT/ML injection Inject 20  Units into the skin 3 (three) times daily before meals.  . Insulin Syringe-Needle U-100 (INSULIN SYRINGE 1CC/31GX5/16") 31G X 5/16" 1 ML MISC   . linaclotide (LINZESS) 145 MCG CAPS capsule Take 1 capsule (145 mcg total) by mouth daily before breakfast.  . lisinopril (ZESTRIL) 40 MG tablet Take 1 tablet (40 mg total) by mouth daily.  Marland Kitchen NEEDLE, DISP, 30 G (BD DISP NEEDLES) 30G X 1/2" MISC Use as directed with insulin three times daily  . oxyCODONE-acetaminophen (PERCOCET) 10-325 MG tablet Take 1 tablet by mouth every 4 (four) hours as needed for pain.  Marland Kitchen PACLitaxel Protein-Bound Part (ABRAXANE IV) Inject into the vein. Days 1, 8, 15 q 28 days  . [DISCONTINUED] Oxycodone HCl 10 MG TABS Take 1 tablet (10 mg total) by mouth every 6 (six) hours as needed.  . [DISCONTINUED] oxyCODONE-acetaminophen (PERCOCET) 10-325 MG tablet Take 2 tablets by mouth daily.  . Lactulose 20 GM/30ML SOLN Take 30 mLs (20 g total) by mouth at bedtime as needed. (Patient not taking: Reported on 12/28/2019)  . levofloxacin (LEVAQUIN) 500 MG tablet Take 1 tablet (500 mg total) by mouth daily.  Marland Kitchen lidocaine-prilocaine (EMLA) cream Apply small amount to port a cath site and cover with plastic wrap 1 hour prior to chemotherapy appointments (Patient not taking: Reported on 12/28/2019)  . ondansetron (ZOFRAN) 4 MG tablet Take 1 tablet (4 mg total) by mouth 3 (three) times daily. (Patient not taking: Reported on 12/28/2019)  . prochlorperazine (COMPAZINE) 10 MG tablet Take 1 tablet (10 mg total) by mouth every 6 (six) hours as needed (Nausea or vomiting). (Patient not taking: Reported on 10/25/2019)  . [DISCONTINUED] amoxicillin-clavulanate (AUGMENTIN) 875-125 MG tablet Take 1 tablet by mouth 2 (two) times daily. (Patient not taking: Reported on 12/08/2019)  . [DISCONTINUED] OXYCONTIN 40 MG 12 hr tablet Take 40 mg by mouth every 12 (twelve) hours.    No facility-administered encounter medications on file as of 12/28/2019.    ALLERGIES:    Allergies  Allergen Reactions  . Hydrocodone Hives  . Tramadol Itching     PHYSICAL EXAM:  ECOG Performance status: 1  Vitals:   12/28/19 0925  BP: (!) 130/54  Pulse: 98  Resp: 20  Temp: (!) 96.9 F (36.1 C)  SpO2: 94%   Filed Weights   12/28/19 0925  Weight: 249 lb 3.2 oz (113 kg)    Physical Exam Vitals reviewed.  Constitutional:      Appearance: Normal appearance.  Cardiovascular:     Rate and Rhythm: Normal  rate and regular rhythm.     Heart sounds: Normal heart sounds.  Pulmonary:     Effort: Pulmonary effort is normal.     Breath sounds: Normal breath sounds.  Abdominal:     General: There is no distension.     Palpations: Abdomen is soft. There is no mass.  Musculoskeletal:        General: No swelling.  Lymphadenopathy:     Cervical: No cervical adenopathy.  Skin:    General: Skin is warm.  Neurological:     General: No focal deficit present.     Mental Status: She is alert and oriented to person, place, and time.  Psychiatric:        Mood and Affect: Mood normal.        Behavior: Behavior normal.     LABORATORY DATA:  I have reviewed the labs as listed.  CBC    Component Value Date/Time   WBC 7.7 12/28/2019 0931   RBC 4.70 12/28/2019 0931   HGB 11.7 (L) 12/28/2019 0931   HCT 38.1 12/28/2019 0931   PLT 275 12/28/2019 0931   MCV 81.1 12/28/2019 0931   MCH 24.9 (L) 12/28/2019 0931   MCHC 30.7 12/28/2019 0931   RDW 21.8 (H) 12/28/2019 0931   LYMPHSABS 2.4 12/28/2019 0931   MONOABS 1.1 (H) 12/28/2019 0931   EOSABS 0.2 12/28/2019 0931   BASOSABS 0.0 12/28/2019 0931   CMP Latest Ref Rng & Units 12/28/2019 12/08/2019 11/23/2019  Glucose 70 - 99 mg/dL 277(H) 243(H) 216(H)  BUN 6 - 20 mg/dL _0 Creatinine 0.44 - 1.00 mg/dL 0.86 0.80 0.69  Sodium 135 - 145 mmol/L 136 136 135  Potassium 3.5 - 5.1 mmol/L 3.8 3.4(L) 3.8  Chloride 98 - 111 mmol/L 96(L) 96(L) 98  CO2 22 - 32 mmol/L _1 Calcium 8.9 - 10.3 mg/dL 9.0 9.1 8.9  Total  Protein 6.5 - 8.1 g/dL 6.2(L) 7.2 6.7  Total Bilirubin 0.3 - 1.2 mg/dL 0.5 0.3 0.4  Alkaline Phos 38 - 126 U/L 87 90 80  AST 15 - 41 U/L _2 ALT 0 - 44 U/L _3 DIAGNOSTIC IMAGING:  I have reviewed scans.   I have reviewed Venita Lick LPN's note and agree with the documentation.  I personally performed a face-to-face visit, made revisions and my assessment and plan is as follows.    ASSESSMENT & PLAN:   Pancreatic cancer metastasized to liver ([place]) 1.  Metastatic pancreatic cancer to the liver: -7 cycles of gemcitabine and Abraxane day 1 day 15 from 05/17/2019 through 11/23/2019. -CT AP after cycle 4 on 08/29/2019 showed improvement. -She tolerated last cycle very well.  We reviewed CBC.  White count and platelet count is normal.  CA 19-9 was 3 on 11/23/2019. -She will proceed with day 1 of cycle 8 chemotherapy.  I plan to repeat scans after day 15 cycle 8. -We will see her back in 2 weeks for follow-up.  2.  Peripheral neuropathy: -Continue gabapentin 300 mg 3 times a day.  No worsening of numbness.  3.  Constipation: -Continue Colace 200 mg at bedtime.  Use lactulose as needed.  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  This was incidentally found on the most recent CT scan.  We will consider anticoagulation if any abdominal pain.  5.  New onset cough: -She reports some yellowish to black expectoration.  No  fevers or chills.  White count is normal. -We will treat with Levaquin 500 mg for 7 days.      Orders placed this encounter:  No orders of the defined types were placed in this encounter.     Derek Jack, MD [facility] [phone]

## 2019-12-28 NOTE — Patient Instructions (Signed)
[facility] Discharge Instructions  You were seen today by Dr. Katragadda. He went over your recent lab results. He will see you back in 2 weeks for labs, treatment and follow up.   Thank you for choosing [facility] at [facility] to provide your oncology and hematology care.  To afford each patient quality time with our provider, please arrive at least 15 minutes before your scheduled appointment time.   If you have a lab appointment with the Cancer Center please come in thru the  Main Entrance and check in at the main information desk  You need to re-schedule your appointment should you arrive 10 or more minutes late.  We strive to give you quality time with our providers, and arriving late affects you and other patients whose appointments are after yours.  Also, if you no show three or more times for appointments you may be dismissed from the clinic at the providers discretion.     Again, thank you for choosing [facility].  Our hope is that these requests will decrease the amount of time that you wait before being seen by our physicians.       _____________________________________________________________  Should you have questions after your visit to [facility], please contact our office at [phone] between the hours of 8:00 a.m. and 4:30 p.m.  Voicemails left after 4:00 p.m. will not be returned until the following business day.  For prescription refill requests, have your pharmacy contact our office and allow 72 hours.    Cancer Center Support Programs:   > Cancer Support Group  2nd Tuesday of the month 1pm-2pm, Journey Room    

## 2019-12-28 NOTE — Patient Instructions (Signed)
[facility] Discharge Instructions for Patients Receiving Chemotherapy  Today you received the following chemotherapy agents   To help prevent nausea and vomiting after your treatment, we encourage you to take your nausea medication   If you develop nausea and vomiting that is not controlled by your nausea medication, call the clinic.   BELOW ARE SYMPTOMS THAT SHOULD BE REPORTED IMMEDIATELY:  *FEVER GREATER THAN 100.5 F  *CHILLS WITH OR WITHOUT FEVER  NAUSEA AND VOMITING THAT IS NOT CONTROLLED WITH YOUR NAUSEA MEDICATION  *UNUSUAL SHORTNESS OF BREATH  *UNUSUAL BRUISING OR BLEEDING  TENDERNESS IN MOUTH AND THROAT WITH OR WITHOUT PRESENCE OF ULCERS  *URINARY PROBLEMS  *BOWEL PROBLEMS  UNUSUAL RASH Items with * indicate a potential emergency and should be followed up as soon as possible.  Feel free to call the clinic should you have any questions or concerns. The clinic phone number is [phone].  Please show the CHEMO ALERT CARD at check-in to the Emergency Department and triage nurse.   

## 2019-12-28 NOTE — Progress Notes (Signed)
Labs reviewed today with MD. Proceed with treatment today per MD.   Treatment given per orders. Patient tolerated it well without problems. Vitals stable and discharged home from clinic ambulatory. Follow up as scheduled.

## 2019-12-28 NOTE — Progress Notes (Signed)
Patient has been assessed, vital signs and labs have been reviewed by Dr. Katragadda. ANC, Creatinine, LFTs, and Platelets are within treatment parameters per Dr. Katragadda. The patient is good to proceed with treatment at this time.  

## 2019-12-28 NOTE — Assessment & Plan Note (Signed)
1.  Metastatic pancreatic cancer to the liver: -7 cycles of gemcitabine and Abraxane day 1 day 15 from 05/17/2019 through 11/23/2019. -CT AP after cycle 4 on 08/29/2019 showed improvement. -She tolerated last cycle very well.  We reviewed CBC.  White count and platelet count is normal.  CA 19-9 was 3 on 11/23/2019. -She will proceed with day 1 of cycle 8 chemotherapy.  I plan to repeat scans after day 15 cycle 8. -We will see her back in 2 weeks for follow-up.  2.  Peripheral neuropathy: -Continue gabapentin 300 mg 3 times a day.  No worsening of numbness.  3.  Constipation: -Continue Colace 200 mg at bedtime.  Use lactulose as needed.  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  This was incidentally found on the most recent CT scan.  We will consider anticoagulation if any abdominal pain.  5.  New onset cough: -She reports some yellowish to black expectoration.  No fevers or chills.  White count is normal. -We will treat with Levaquin 500 mg for 7 days.

## 2020-01-11 ENCOUNTER — Ambulatory Visit (HOSPITAL_COMMUNITY): Payer: Medicare HMO | Admitting: Hematology

## 2020-01-11 ENCOUNTER — Ambulatory Visit (HOSPITAL_COMMUNITY): Payer: Medicare HMO

## 2020-01-11 ENCOUNTER — Other Ambulatory Visit (HOSPITAL_COMMUNITY): Payer: Medicare HMO

## 2020-01-12 ENCOUNTER — Ambulatory Visit (HOSPITAL_COMMUNITY): Payer: Medicare HMO | Admitting: Hematology

## 2020-01-12 ENCOUNTER — Ambulatory Visit (HOSPITAL_COMMUNITY): Payer: Medicare HMO

## 2020-01-12 ENCOUNTER — Other Ambulatory Visit (HOSPITAL_COMMUNITY): Payer: Medicare HMO

## 2020-01-16 NOTE — Progress Notes (Signed)

## 2020-01-19 ENCOUNTER — Inpatient Hospital Stay (HOSPITAL_BASED_OUTPATIENT_CLINIC_OR_DEPARTMENT_OTHER): Payer: Medicare HMO | Admitting: Hematology

## 2020-01-19 ENCOUNTER — Inpatient Hospital Stay (HOSPITAL_COMMUNITY): Payer: Medicare HMO | Attending: Hematology

## 2020-01-19 ENCOUNTER — Inpatient Hospital Stay (HOSPITAL_COMMUNITY): Payer: Medicare HMO

## 2020-01-19 ENCOUNTER — Other Ambulatory Visit: Payer: Self-pay

## 2020-01-19 ENCOUNTER — Encounter (HOSPITAL_COMMUNITY): Payer: Self-pay | Admitting: Hematology

## 2020-01-19 VITALS — BP 128/60 | HR 88 | Temp 96.3°F | Resp 20 | Wt 239.0 lb

## 2020-01-19 DIAGNOSIS — C787 Secondary malignant neoplasm of liver and intrahepatic bile duct: Secondary | ICD-10-CM | POA: Diagnosis present

## 2020-01-19 DIAGNOSIS — Z5111 Encounter for antineoplastic chemotherapy: Secondary | ICD-10-CM | POA: Insufficient documentation

## 2020-01-19 DIAGNOSIS — Z95828 Presence of other vascular implants and grafts: Secondary | ICD-10-CM

## 2020-01-19 DIAGNOSIS — C259 Malignant neoplasm of pancreas, unspecified: Secondary | ICD-10-CM | POA: Diagnosis not present

## 2020-01-19 DIAGNOSIS — C252 Malignant neoplasm of tail of pancreas: Secondary | ICD-10-CM | POA: Insufficient documentation

## 2020-01-19 LAB — COMPREHENSIVE METABOLIC PANEL
ALT: 25 U/L (ref 0–44)
AST: 26 U/L (ref 15–41)
Albumin: 3.7 g/dL (ref 3.5–5.0)
Alkaline Phosphatase: 80 U/L (ref 38–126)
Anion gap: 13 (ref 5–15)
BUN: 9 mg/dL (ref 6–20)
CO2: 24 mmol/L (ref 22–32)
Calcium: 9.1 mg/dL (ref 8.9–10.3)
Chloride: 94 mmol/L — ABNORMAL LOW (ref 98–111)
Creatinine, Ser: 0.77 mg/dL (ref 0.44–1.00)
GFR calc Af Amer: >60 mL/min (ref >60)
GFR calc non Af Amer: >60 mL/min (ref >60)
Glucose, Bld: 333 mg/dL — ABNORMAL HIGH (ref 70–99)
Potassium: 3.6 mmol/L (ref 3.5–5.1)
Sodium: 131 mmol/L — ABNORMAL LOW (ref 135–145)
Total Bilirubin: 0.4 mg/dL (ref 0.3–1.2)
Total Protein: 6.8 g/dL (ref 6.5–8.1)

## 2020-01-19 LAB — CBC WITH DIFFERENTIAL/PLATELET
Abs Immature Granulocytes: 0.07 10*3/uL (ref 0.00–0.07)
Basophils Absolute: 0.1 10*3/uL (ref 0.0–0.1)
Basophils Relative: 1 %
Eosinophils Absolute: 0.2 10*3/uL (ref 0.0–0.5)
Eosinophils Relative: 2 %
HCT: 43.1 % (ref 36.0–46.0)
Hemoglobin: 13.4 g/dL (ref 12.0–15.0)
Immature Granulocytes: 1 %
Lymphocytes Relative: 27 %
Lymphs Abs: 2.1 10*3/uL (ref 0.7–4.0)
MCH: 25 pg — ABNORMAL LOW (ref 26.0–34.0)
MCHC: 31.1 g/dL (ref 30.0–36.0)
MCV: 80.6 fL (ref 80.0–100.0)
Monocytes Absolute: 0.7 10*3/uL (ref 0.1–1.0)
Monocytes Relative: 9 %
Neutro Abs: 4.8 10*3/uL (ref 1.7–7.7)
Neutrophils Relative %: 60 %
Platelets: 274 10*3/uL (ref 150–400)
RBC: 5.35 MIL/uL — ABNORMAL HIGH (ref 3.87–5.11)
RDW: 20.1 % — ABNORMAL HIGH (ref 11.5–15.5)
WBC: 8 10*3/uL (ref 4.0–10.5)
nRBC: 0 % (ref 0.0–0.2)

## 2020-01-19 MED ORDER — HEPARIN SOD (PORK) LOCK FLUSH 100 UNIT/ML IV SOLN
500.0000 [IU] | Freq: Once | INTRAVENOUS | Status: AC | PRN
  Administered 2020-01-19: 500 [IU]

## 2020-01-19 MED ORDER — SODIUM CHLORIDE 0.9 % IV SOLN
800.0000 mg/m2 | Freq: Once | INTRAVENOUS | Status: AC
  Administered 2020-01-19: 1786 mg via INTRAVENOUS
  Filled 2020-01-19: qty 26.3

## 2020-01-19 MED ORDER — SODIUM CHLORIDE 0.9 % IV SOLN
Freq: Once | INTRAVENOUS | Status: AC
  Filled 2020-01-19: qty 4

## 2020-01-19 MED ORDER — PACLITAXEL PROTEIN-BOUND CHEMO INJECTION 100 MG
75.0000 mg/m2 | Freq: Once | INTRAVENOUS | Status: AC
  Administered 2020-01-19: 175 mg via INTRAVENOUS
  Filled 2020-01-19: qty 35

## 2020-01-19 MED ORDER — SODIUM CHLORIDE 0.9 % IV SOLN: Freq: Once | INTRAVENOUS | Status: AC

## 2020-01-19 MED ORDER — SODIUM CHLORIDE 0.9% FLUSH
10.0000 mL | INTRAVENOUS | Status: DC | PRN
  Administered 2020-01-19: 10 mL

## 2020-01-19 NOTE — Progress Notes (Signed)
Patient has been assessed, vital signs and labs have been reviewed by Dr. Katragadda. ANC, Creatinine, LFTs, and Platelets are within treatment parameters per Dr. Katragadda. The patient is good to proceed with treatment at this time.  

## 2020-01-19 NOTE — Progress Notes (Signed)
Doris [name], Doris [name]   CLINIC:  Medical Oncology/Hematology  PCP:  Merdis Delay, DO [address] [place] [address] [phone]   REASON FOR VISIT:  Follow-up for metastatic pancreatic cancer to the liver.   BRIEF ONCOLOGIC HISTORY:  Oncology History  Pancreatic cancer metastasized to liver ([place])  05/09/2019 Initial Diagnosis   Pancreatic cancer metastasized to liver ([place])   05/17/2019 -  Chemotherapy   The patient had PACLitaxel-protein bound (ABRAXANE) chemo infusion 275 mg, 125 mg/m2 = 275 mg, Intravenous,  Once, 8 of 9 cycles Dose modification: 100 mg/m2 (80 % of original dose 125 mg/m2, Cycle 1, Reason: Other (see comments), Comment: neutropenia), 93.75 mg/m2 (75 % of original dose 125 mg/m2, Cycle 1, Reason: Other (see comments), Comment: neutropenia), 100 mg/m2 (80 % of original dose 125 mg/m2, Cycle 2, Reason: Other (see comments), Comment: cytopenias with previous treatment), 75 mg/m2 (original dose 125 mg/m2, Cycle 3, Reason: Provider Judgment, Comment: neuropathy stated as 10/10 dose reduce required per NP Reynolds Bowl) Administration: 275 mg (05/17/2019), 225 mg (05/24/2019), 200 mg (05/31/2019), 225 mg (06/14/2019), 225 mg (06/28/2019), 225 mg (07/12/2019), 175 mg (07/19/2019), 175 mg (08/16/2019), 175 mg (08/31/2019), 175 mg (09/27/2019), 175 mg (10/11/2019), 175 mg (10/25/2019), 175 mg (11/08/2019), 175 mg (11/23/2019), 175 mg (12/08/2019), 175 mg (12/28/2019), 175 mg (01/19/2020) ondansetron (ZOFRAN) 8 mg in sodium chloride 0.9 % 50 mL IVPB, 8 mg (100 % of original dose 8 mg), Intravenous,  Once, 1 of 1 cycle Dose modification: 8 mg (original dose 8 mg, Cycle 1) gemcitabine (GEMZAR) 2,200 mg in sodium chloride 0.9 % 250 mL chemo infusion, 2,204 mg, Intravenous,  Once, 8 of 9 cycles Dose modification: 750 mg/m2 (75 % of original dose 1,000 mg/m2, Cycle 1, Reason: Other (see comments), Comment: neutropenia), 750 mg/m2 (75 % of original dose  1,000 mg/m2, Cycle 1, Reason: Other (see comments), Comment: neutropenia), 800 mg/m2 (80 % of original dose 1,000 mg/m2, Cycle 2, Reason: Other (see comments), Comment: cytopenia) Administration: 2,200 mg (05/17/2019), 1,600 mg (05/24/2019), 1,600 mg (05/31/2019), 1,786 mg (06/14/2019), 1,786 mg (06/28/2019), 1,786 mg (07/12/2019), 1,786 mg (07/19/2019), 1,786 mg (08/16/2019), 1,786 mg (08/31/2019), 1,786 mg (09/27/2019), 1,786 mg (10/11/2019), 1,786 mg (10/25/2019), 1,786 mg (11/08/2019), 1,786 mg (11/23/2019), 1,786 mg (12/08/2019), 1,786 mg (12/28/2019), 1,786 mg (01/19/2020) ondansetron (ZOFRAN) 8 mg, dexamethasone (DECADRON) 10 mg in sodium chloride 0.9 % 50 mL IVPB, , Intravenous,  Once, 8 of 9 cycles Administration:  (05/17/2019),  (05/24/2019),  (05/31/2019),  (06/14/2019),  (06/28/2019),  (07/12/2019),  (07/19/2019),  (08/16/2019),  (08/31/2019),  (09/27/2019),  (10/11/2019),  (10/25/2019),  (11/08/2019),  (11/23/2019),  (12/08/2019),  (12/28/2019),  (01/19/2020)  for chemotherapy treatment.    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  The Common Hereditary Gene Panel offered by [facility] includes sequencing and/or deletion duplication testing of the following 48 genes: APC, ATM, AXIN2, BARD1, BMPR1A, BRCA1, BRCA2, BRIP1, CDH1, CDK4, CDKN2A (p14ARF), CDKN2A (p16INK4a), CHEK2, CTNNA1, DICER1, EPCAM (Deletion/duplication testing only), GREM1 (promoter region deletion/duplication testing only), KIT, MEN1, MLH1, MSH2, MSH3, MSH6, MUTYH, NBN, NF1, NHTL1, PALB2, PDGFRA, PMS2, POLD1, POLE, PTEN, RAD50, RAD51C, RAD51D, RNF43, SDHB, SDHC, SDHD, SMAD4, SMARCA4. STK11, TP53, TSC1, TSC2, and VHL.  The following genes were evaluated for sequence changes only: SDHA and HOXB13 c.251G>A variant only. The report date is August 02, 2019.      CANCER STAGING: Cancer Staging No matching staging information was found for the  patient.   INTERVAL HISTORY:  Doris [name] 59  y.o. female seen for follow-up and toxicity assessment prior to next cycle of chemotherapy.  She reported appetite is 100%.  Energy levels are 25%.  Mild fatigue reported.  Shortness of breath on exertion is stable.  Numbness in the bottom of the feet has also been stable.  No abdominal pain reported.  REVIEW OF SYSTEMS:  Review of Systems  Respiratory: Positive for shortness of breath.   Neurological: Positive for numbness.  All other systems reviewed and are negative.    PAST MEDICAL/SURGICAL HISTORY:  Past Medical History:  Diagnosis Date  . Diabetes mellitus without complication ([place])   . Family history of prostate cancer   . Family history of stomach cancer   . Hypertension   . Pancreatic cancer ([place])   . Port-A-Cath in place 05/16/2019   Past Surgical History:  Procedure Laterality Date  . ABDOMINAL HYSTERECTOMY    . BACK SURGERY    . KIDNEY SURGERY     per pt, had leakage  . tubal ligation Left      SOCIAL HISTORY:  Social History   Socioeconomic History  . Marital status: Divorced    Spouse name: Not on file  . Number of children: 1  . Years of education: Not on file  . Highest education level: Not on file  Occupational History  . Occupation: disabled  Tobacco Use  . Smoking status: Current Every Day Smoker    Packs/day: 1.00    Years: 45.00    Pack years: 45.00    Types: Cigarettes  . Smokeless tobacco: Never Used  Substance and Sexual Activity  . Alcohol use: Not Currently  . Drug use: Never  . Sexual activity: Not on file  Other Topics Concern  . Not on file  Social History Narrative  . Not on file   Social Determinants of Health   Financial Resource Strain: Low Risk   . Difficulty of Paying Living Expenses: Not hard at all  Food Insecurity: No Food Insecurity  . Worried About Charity fundraiser in the Last Year: Never true  . Ran Out of Food in the Last Year: Never true  Transportation Needs: Unmet Transportation Needs  . Lack of  Transportation (Medical): Yes  . Lack of Transportation (Non-Medical): Yes  Physical Activity: Inactive  . Days of Exercise per Week: 0 days  . Minutes of Exercise per Session: 0 min  Stress: No Stress Concern Present  . Feeling of Stress : Not at all  Social Connections: Somewhat Isolated  . Frequency of Communication with Friends and Family: More than three times a week  . Frequency of Social Gatherings with Friends and Family: More than three times a week  . Attends Religious Services: 1 to 4 times per year  . Active Member of Clubs or Organizations: No  . Attends Archivist Meetings: Never  . Marital Status: Divorced  Human resources officer Violence: Not At Risk  . Fear of Current or Ex-Partner: No  . Emotionally Abused: No  . Physically Abused: No  . Sexually Abused: No    FAMILY HISTORY:  Family History  Problem Relation Age of Onset  . Diabetes Mother   . Hypertension Mother   . Lung cancer Mother 105       d. 24  . Diabetes Father   . Hypertension Father   . Heart disease Brother   . Stomach cancer Paternal Aunt 55  . Stroke Paternal Grandmother   .  Prostate cancer Other        PGMs brother  . Stomach cancer Other        PGMs mother    CURRENT MEDICATIONS:  Outpatient Encounter Medications as of 01/19/2020  Medication Sig  . amLODipine (NORVASC) 5 MG tablet Take 1 tablet (5 mg total) by mouth 2 (two) times daily.  Marland Kitchen docusate sodium (COLACE) 100 MG capsule Take 2 capsules (200 mg total) by mouth at bedtime.  . Fluticasone-Salmeterol (ADVAIR DISKUS) 250-50 MCG/DOSE AEPB Inhale 1 puff into the lungs 2 (two) times daily.  Marland Kitchen gabapentin (NEURONTIN) 300 MG capsule TAKE 1 CAPSULE(300 MG) BY MOUTH THREE TIMES DAILY  . Gemcitabine HCl (GEMZAR IV) Inject into the vein. Days 1, 8, 15 q 28 days  . insulin aspart (NOVOLOG) 100 UNIT/ML injection Inject 20 Units into the skin 3 (three) times daily before meals.  . Insulin Syringe-Needle U-100 (INSULIN SYRINGE 1CC/31GX5/16")  31G X 5/16" 1 ML MISC   . Lactulose 20 GM/30ML SOLN Take 30 mLs (20 g total) by mouth at bedtime as needed. (Patient not taking: Reported on 12/28/2019)  . levofloxacin (LEVAQUIN) 500 MG tablet Take 1 tablet (500 mg total) by mouth daily.  Marland Kitchen lidocaine-prilocaine (EMLA) cream Apply small amount to port a cath site and cover with plastic wrap 1 hour prior to chemotherapy appointments (Patient not taking: Reported on 12/28/2019)  . linaclotide (LINZESS) 145 MCG CAPS capsule Take 1 capsule (145 mcg total) by mouth daily before breakfast.  . lisinopril (ZESTRIL) 40 MG tablet Take 1 tablet (40 mg total) by mouth daily.  Marland Kitchen NEEDLE, DISP, 30 G (BD DISP NEEDLES) 30G X 1/2" MISC Use as directed with insulin three times daily  . ondansetron (ZOFRAN) 4 MG tablet Take 1 tablet (4 mg total) by mouth 3 (three) times daily. (Patient not taking: Reported on 12/28/2019)  . oxyCODONE-acetaminophen (PERCOCET) 10-325 MG tablet Take 1 tablet by mouth every 4 (four) hours as needed for pain.  Marland Kitchen PACLitaxel Protein-Bound Part (ABRAXANE IV) Inject into the vein. Days 1, 8, 15 q 28 days  . prochlorperazine (COMPAZINE) 10 MG tablet Take 1 tablet (10 mg total) by mouth every 6 (six) hours as needed (Nausea or vomiting). (Patient not taking: Reported on 10/25/2019)   Facility-Administered Encounter Medications as of 01/19/2020  Medication  . [COMPLETED] 0.9 %  sodium chloride infusion  . [COMPLETED] gemcitabine (GEMZAR) 1,786 mg in sodium chloride 0.9 % 250 mL chemo infusion  . [COMPLETED] heparin lock flush 100 unit/mL  . [COMPLETED] ondansetron (ZOFRAN) 8 mg, dexamethasone (DECADRON) 10 mg in sodium chloride 0.9 % 50 mL IVPB  . [COMPLETED] PACLitaxel-protein bound (ABRAXANE) chemo infusion 175 mg  . sodium chloride flush (NS) 0.9 % injection 10 mL    ALLERGIES:  Allergies  Allergen Reactions  . Hydrocodone Hives  . Tramadol Itching     PHYSICAL EXAM:  ECOG Performance status: 1  Vitals:   01/19/20 0950  BP: 128/60    Pulse: 88  Resp: 20  Temp: (!) 96.3 F (35.7 C)  SpO2: 97%   Filed Weights   01/19/20 0950  Weight: 239 lb (108.4 kg)    Physical Exam Vitals reviewed.  Constitutional:      Appearance: Normal appearance.  Cardiovascular:     Rate and Rhythm: Normal rate and regular rhythm.     Heart sounds: Normal heart sounds.  Pulmonary:     Effort: Pulmonary effort is normal.     Breath sounds: Normal breath sounds.  Abdominal:  General: There is no distension.     Palpations: Abdomen is soft. There is no mass.  Musculoskeletal:        General: No swelling.  Lymphadenopathy:     Cervical: No cervical adenopathy.  Skin:    General: Skin is warm.  Neurological:     General: No focal deficit present.     Mental Status: She is alert and oriented to person, place, and time.  Psychiatric:        Mood and Affect: Mood normal.        Behavior: Behavior normal.     LABORATORY DATA:  I have reviewed the labs as listed.  CBC    Component Value Date/Time   WBC 8.0 01/19/2020 0936   RBC 5.35 (H) 01/19/2020 0936   HGB 13.4 01/19/2020 0936   HCT 43.1 01/19/2020 0936   PLT 274 01/19/2020 0936   MCV 80.6 01/19/2020 0936   MCH 25.0 (L) 01/19/2020 0936   MCHC 31.1 01/19/2020 0936   RDW 20.1 (H) 01/19/2020 0936   LYMPHSABS 2.1 01/19/2020 0936   MONOABS 0.7 01/19/2020 0936   EOSABS 0.2 01/19/2020 0936   BASOSABS 0.1 01/19/2020 0936   CMP Latest Ref Rng & Units 01/19/2020 12/28/2019 12/08/2019  Glucose 70 - 99 mg/dL 333(H) 277(H) 243(H)  BUN 6 - 20 mg/dL '9 14 16  '$ Creatinine 0.44 - 1.00 mg/dL 0.77 0.86 0.80  Sodium 135 - 145 mmol/L 131(L) 136 136  Potassium 3.5 - 5.1 mmol/L 3.6 3.8 3.4(L)  Chloride 98 - 111 mmol/L 94(L) 96(L) 96(L)  CO2 22 - 32 mmol/L '24 29 29  '$ Calcium 8.9 - 10.3 mg/dL 9.1 9.0 9.1  Total Protein 6.5 - 8.1 g/dL 6.8 6.2(L) 7.2  Total Bilirubin 0.3 - 1.2 mg/dL 0.4 0.5 0.3  Alkaline Phos 38 - 126 U/L 80 87 90  AST 15 - 41 U/L '26 20 25  '$ ALT 0 - 44 U/L '25 18 24        '$ DIAGNOSTIC IMAGING:  I have reviewed her scans.   I have reviewed Venita Lick LPN's note and agree with the documentation.  I personally performed a face-to-face visit, made revisions and my assessment and plan is as follows.    ASSESSMENT & PLAN:   Pancreatic cancer metastasized to liver ([place]) 1.  Metastatic pancreatic cancer to the liver: -8 cycles of gemcitabine and Abraxane day 1 and day 15 from 05/17/2019 through 12/28/2019. -CTAP after cycle 4 on 08/29/2019 showed improvement in malignancy. -CA 19-9 was 3 on 11/23/2019. -We reviewed her labs today.  CBC was within normal limits.  LFTs and total bilirubin was normal.  She will proceed with day 15 of cycle 8 today. -We will set her up for CT CAP prior to next visit in 2 weeks.  2.  Peripheral neuropathy: -Continue gabapentin 300 mg 3 times a day.  No worsening of numbness.  3.  Constipation: -Continue Colace 200 mg at bedtime.  Use lactulose as needed.  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  This was incidentally found on the most recent CT scan. -We will consider anticoagulation if any abdominal pain.         Orders placed this encounter:  Orders Placed This Encounter  Procedures  . CT Chest W Contrast  . CT Abdomen Pelvis W Contrast  . CBC with Differential/Platelet  . Comprehensive metabolic panel  . Magnesium  . Lactate dehydrogenase  . SCHEDULING COMMUNICATION  . TREATMENT CONDITIONS  Derek Jack, MD [place] [phone]

## 2020-01-19 NOTE — Progress Notes (Signed)
Patient presents today for treatment and follow up visit with Dr. Delton Coombes. MAR reviewed. Patient has no complaints of any significant changes since her last visit. Vital signs within parameters for treatment. Labs within parameters for treatment today. Glucose 333 today.   Message received from [facility] LPN/ Dr. Delton Coombes proceed with treatment.   Treatment given today per MD orders. Tolerated infusion without adverse affects. Vital signs stable. No complaints at this time. Discharged from clinic ambulatory. F/U with [facility] as scheduled.

## 2020-01-19 NOTE — Patient Instructions (Signed)
[facility] Discharge Instructions for Patients Receiving Chemotherapy  Today you received the following chemotherapy agents   To help prevent nausea and vomiting after your treatment, we encourage you to take your nausea medication   If you develop nausea and vomiting that is not controlled by your nausea medication, call the clinic.   BELOW ARE SYMPTOMS THAT SHOULD BE REPORTED IMMEDIATELY:  *FEVER GREATER THAN 100.5 F  *CHILLS WITH OR WITHOUT FEVER  NAUSEA AND VOMITING THAT IS NOT CONTROLLED WITH YOUR NAUSEA MEDICATION  *UNUSUAL SHORTNESS OF BREATH  *UNUSUAL BRUISING OR BLEEDING  TENDERNESS IN MOUTH AND THROAT WITH OR WITHOUT PRESENCE OF ULCERS  *URINARY PROBLEMS  *BOWEL PROBLEMS  UNUSUAL RASH Items with * indicate a potential emergency and should be followed up as soon as possible.  Feel free to call the clinic should you have any questions or concerns. The clinic phone number is [phone].  Please show the CHEMO ALERT CARD at check-in to the Emergency Department and triage nurse.   

## 2020-01-19 NOTE — Patient Instructions (Addendum)
[place] at [facility] Discharge Instructions  You were seen today by Dr. Delton Coombes. He went over your recent lab results. He will see you back in 2 weeks for labs, scans, treatment and follow up.   Thank you for choosing [place] at [facility] to provide your oncology and hematology care.  To afford each patient quality time with our provider, please arrive at least 15 minutes before your scheduled appointment time.   If you have a lab appointment with the [place] please come in thru the  Main Entrance and check in at the main information desk  You need to re-schedule your appointment should you arrive 10 or more minutes late.  We strive to give you quality time with our providers, and arriving late affects you and other patients whose appointments are after yours.  Also, if you no show three or more times for appointments you may be dismissed from the clinic at the providers discretion.     Again, thank you for choosing [facility].  Our hope is that these requests will decrease the amount of time that you wait before being seen by our physicians.       _____________________________________________________________  Should you have questions after your visit to [facility], please contact our office at [phone] between the hours of 8:00 a.m. and 4:30 p.m.  Voicemails left after 4:00 p.m. will not be returned until the following business day.  For prescription refill requests, have your pharmacy contact our office and allow 72 hours.    Cancer Center Support Programs:   > Cancer Support Group  2nd Tuesday of the month 1pm-2pm, Journey Room

## 2020-01-19 NOTE — Patient Instructions (Signed)
[facility] at [facility] Discharge Instructions  Labs drawn from portacath today   Thank you for choosing [facility] at [facility] to provide your oncology and hematology care.  To afford each patient quality time with our provider, please arrive at least 15 minutes before your scheduled appointment time.   If you have a lab appointment with the Cancer Center please come in thru the Main Entrance and check in at the main information desk.  You need to re-schedule your appointment should you arrive 10 or more minutes late.  We strive to give you quality time with our providers, and arriving late affects you and other patients whose appointments are after yours.  Also, if you no show three or more times for appointments you may be dismissed from the clinic at the providers discretion.     Again, thank you for choosing [facility].  Our hope is that these requests will decrease the amount of time that you wait before being seen by our physicians.       _____________________________________________________________  Should you have questions after your visit to [facility], please contact our office at [phone] between the hours of 8:00 a.m. and 4:30 p.m.  Voicemails left after 4:00 p.m. will not be returned until the following business day.  For prescription refill requests, have your pharmacy contact our office and allow 72 hours.    Due to Covid, you will need to wear a mask upon entering the hospital. If you do not have a mask, a mask will be given to you at the Main Entrance upon arrival. For doctor visits, patients may have 1 support person with them. For treatment visits, patients can not have anyone with them due to social distancing guidelines and our immunocompromised population.     

## 2020-01-19 NOTE — Assessment & Plan Note (Signed)
1.  Metastatic pancreatic cancer to the liver: -8 cycles of gemcitabine and Abraxane day 1 and day 15 from 05/17/2019 through 12/28/2019. -CTAP after cycle 4 on 08/29/2019 showed improvement in malignancy. -CA 19-9 was 3 on 11/23/2019. -We reviewed her labs today.  CBC was within normal limits.  LFTs and total bilirubin was normal.  She will proceed with day 15 of cycle 8 today. -We will set her up for CT CAP prior to next visit in 2 weeks.  2.  Peripheral neuropathy: -Continue gabapentin 300 mg 3 times a day.  No worsening of numbness.  3.  Constipation: -Continue Colace 200 mg at bedtime.  Use lactulose as needed.  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  This was incidentally found on the most recent CT scan. -We will consider anticoagulation if any abdominal pain.

## 2020-02-03 ENCOUNTER — Other Ambulatory Visit (HOSPITAL_COMMUNITY): Payer: Self-pay | Admitting: Hematology

## 2020-02-03 DIAGNOSIS — C787 Secondary malignant neoplasm of liver and intrahepatic bile duct: Secondary | ICD-10-CM

## 2020-02-07 ENCOUNTER — Ambulatory Visit (HOSPITAL_COMMUNITY)
Admission: RE | Admit: 2020-02-07 | Discharge: 2020-02-07 | Disposition: A | Discharge status: Home / Self Care | Payer: Medicare HMO | Source: Ambulatory Visit | Attending: Hematology | Admitting: Hematology

## 2020-02-07 ENCOUNTER — Other Ambulatory Visit: Payer: Self-pay

## 2020-02-07 DIAGNOSIS — C787 Secondary malignant neoplasm of liver and intrahepatic bile duct: Secondary | ICD-10-CM | POA: Insufficient documentation

## 2020-02-07 DIAGNOSIS — C259 Malignant neoplasm of pancreas, unspecified: Secondary | ICD-10-CM | POA: Diagnosis not present

## 2020-02-07 MED ORDER — IOHEXOL 300 MG/ML  SOLN
100.0000 mL | Freq: Once | INTRAMUSCULAR | Status: AC | PRN
  Administered 2020-02-07: 14:00:00 100 mL via INTRAVENOUS

## 2020-02-09 ENCOUNTER — Inpatient Hospital Stay (HOSPITAL_COMMUNITY): Payer: Medicare HMO

## 2020-02-09 ENCOUNTER — Encounter (HOSPITAL_COMMUNITY): Payer: Self-pay | Admitting: Hematology

## 2020-02-09 ENCOUNTER — Inpatient Hospital Stay (HOSPITAL_BASED_OUTPATIENT_CLINIC_OR_DEPARTMENT_OTHER): Payer: Medicare HMO | Admitting: Hematology

## 2020-02-09 ENCOUNTER — Other Ambulatory Visit: Payer: Self-pay

## 2020-02-09 ENCOUNTER — Inpatient Hospital Stay (HOSPITAL_COMMUNITY): Payer: Medicare HMO | Attending: Hematology

## 2020-02-09 DIAGNOSIS — K5900 Constipation, unspecified: Secondary | ICD-10-CM | POA: Diagnosis not present

## 2020-02-09 DIAGNOSIS — C7800 Secondary malignant neoplasm of unspecified lung: Secondary | ICD-10-CM | POA: Diagnosis not present

## 2020-02-09 DIAGNOSIS — Z8042 Family history of malignant neoplasm of prostate: Secondary | ICD-10-CM | POA: Diagnosis not present

## 2020-02-09 DIAGNOSIS — F17210 Nicotine dependence, cigarettes, uncomplicated: Secondary | ICD-10-CM | POA: Diagnosis not present

## 2020-02-09 DIAGNOSIS — I10 Essential (primary) hypertension: Secondary | ICD-10-CM | POA: Diagnosis not present

## 2020-02-09 DIAGNOSIS — C259 Malignant neoplasm of pancreas, unspecified: Secondary | ICD-10-CM

## 2020-02-09 DIAGNOSIS — Z79899 Other long term (current) drug therapy: Secondary | ICD-10-CM | POA: Insufficient documentation

## 2020-02-09 DIAGNOSIS — E119 Type 2 diabetes mellitus without complications: Secondary | ICD-10-CM | POA: Diagnosis not present

## 2020-02-09 DIAGNOSIS — C787 Secondary malignant neoplasm of liver and intrahepatic bile duct: Secondary | ICD-10-CM

## 2020-02-09 DIAGNOSIS — G629 Polyneuropathy, unspecified: Secondary | ICD-10-CM | POA: Insufficient documentation

## 2020-02-09 DIAGNOSIS — Z5111 Encounter for antineoplastic chemotherapy: Secondary | ICD-10-CM | POA: Diagnosis present

## 2020-02-09 DIAGNOSIS — Z794 Long term (current) use of insulin: Secondary | ICD-10-CM | POA: Insufficient documentation

## 2020-02-09 DIAGNOSIS — C252 Malignant neoplasm of tail of pancreas: Secondary | ICD-10-CM | POA: Diagnosis present

## 2020-02-09 DIAGNOSIS — Z8249 Family history of ischemic heart disease and other diseases of the circulatory system: Secondary | ICD-10-CM | POA: Insufficient documentation

## 2020-02-09 DIAGNOSIS — Z86718 Personal history of other venous thrombosis and embolism: Secondary | ICD-10-CM | POA: Diagnosis not present

## 2020-02-09 DIAGNOSIS — Z833 Family history of diabetes mellitus: Secondary | ICD-10-CM | POA: Insufficient documentation

## 2020-02-09 DIAGNOSIS — Z800 Family history of malignant neoplasm of digestive organs: Secondary | ICD-10-CM | POA: Insufficient documentation

## 2020-02-09 DIAGNOSIS — Z801 Family history of malignant neoplasm of trachea, bronchus and lung: Secondary | ICD-10-CM | POA: Diagnosis not present

## 2020-02-09 LAB — CBC WITH DIFFERENTIAL/PLATELET
Abs Immature Granulocytes: 0.02 10*3/uL (ref 0.00–0.07)
Basophils Absolute: 0 10*3/uL (ref 0.0–0.1)
Basophils Relative: 0 %
Eosinophils Absolute: 0.3 10*3/uL (ref 0.0–0.5)
Eosinophils Relative: 4 %
HCT: 40.8 % (ref 36.0–46.0)
Hemoglobin: 12.5 g/dL (ref 12.0–15.0)
Immature Granulocytes: 0 %
Lymphocytes Relative: 31 %
Lymphs Abs: 2.2 10*3/uL (ref 0.7–4.0)
MCH: 25.1 pg — ABNORMAL LOW (ref 26.0–34.0)
MCHC: 30.6 g/dL (ref 30.0–36.0)
MCV: 81.8 fL (ref 80.0–100.0)
Monocytes Absolute: 0.8 10*3/uL (ref 0.1–1.0)
Monocytes Relative: 12 %
Neutro Abs: 3.7 10*3/uL (ref 1.7–7.7)
Neutrophils Relative %: 53 %
Platelets: 245 10*3/uL (ref 150–400)
RBC: 4.99 MIL/uL (ref 3.87–5.11)
RDW: 19.4 % — ABNORMAL HIGH (ref 11.5–15.5)
WBC: 7 10*3/uL (ref 4.0–10.5)
nRBC: 0 % (ref 0.0–0.2)

## 2020-02-09 LAB — LACTATE DEHYDROGENASE: LDH: 166 U/L (ref 98–192)

## 2020-02-09 LAB — COMPREHENSIVE METABOLIC PANEL
ALT: 21 U/L (ref 0–44)
AST: 25 U/L (ref 15–41)
Albumin: 3.5 g/dL (ref 3.5–5.0)
Alkaline Phosphatase: 85 U/L (ref 38–126)
Anion gap: 11 (ref 5–15)
BUN: 10 mg/dL (ref 6–20)
CO2: 27 mmol/L (ref 22–32)
Calcium: 8.7 mg/dL — ABNORMAL LOW (ref 8.9–10.3)
Chloride: 99 mmol/L (ref 98–111)
Creatinine, Ser: 0.74 mg/dL (ref 0.44–1.00)
GFR calc Af Amer: >60 mL/min (ref >60)
GFR calc non Af Amer: >60 mL/min (ref >60)
Glucose, Bld: 138 mg/dL — ABNORMAL HIGH (ref 70–99)
Potassium: 3.8 mmol/L (ref 3.5–5.1)
Sodium: 137 mmol/L (ref 135–145)
Total Bilirubin: 0.3 mg/dL (ref 0.3–1.2)
Total Protein: 6.3 g/dL — ABNORMAL LOW (ref 6.5–8.1)

## 2020-02-09 LAB — MAGNESIUM: Magnesium: 1.8 mg/dL (ref 1.7–2.4)

## 2020-02-09 MED ORDER — HEPARIN SOD (PORK) LOCK FLUSH 100 UNIT/ML IV SOLN
500.0000 [IU] | Freq: Once | INTRAVENOUS | Status: AC
  Administered 2020-02-09: 500 [IU] via INTRAVENOUS

## 2020-02-09 NOTE — Progress Notes (Signed)
No tx today per MD.  Pt will be changing tx regimen to irinotecan liposomal and 5FU d/t progression of disease.

## 2020-02-09 NOTE — Progress Notes (Signed)
ON PATHWAY REGIMEN - Pancreatic Adenocarcinoma  No Change  Continue With Treatment as Ordered.     A cycle is every 28 days:     Nab-paclitaxel (protein bound)      Gemcitabine   **Always confirm dose/schedule in your pharmacy ordering system**  Patient Characteristics: Metastatic Disease, First Line, PS = 0,1, BRCA1/2 and PALB2  Mutation Absent/Unknown Current evidence of distant metastases<= Yes AJCC T Category: TX AJCC N Category: NX AJCC M Category: M1 AJCC 8 Stage Grouping: IV Line of Therapy: First Line ECOG Performance Status: 1 BRCA1/2 Mutation Status: Awaiting Test Results PALB2 Mutation Status: Awaiting Test Results Intent of Therapy: Non-Curative / Palliative Intent, Discussed with Patient

## 2020-02-09 NOTE — Progress Notes (Signed)
Doris [name], [place] [id number]   CLINIC:  Medical Oncology/Hematology  PCP:  Merdis Delay, DO [address] [place] [address] [phone]   REASON FOR VISIT:  Follow-up for metastatic pancreatic cancer to the liver.   BRIEF ONCOLOGIC HISTORY:  Oncology History  Pancreatic cancer metastasized to liver ([place])  05/09/2019 Initial Diagnosis   Pancreatic cancer metastasized to liver ([place])   05/17/2019 - 02/08/2020 Chemotherapy   The patient had PACLitaxel-protein bound (ABRAXANE) chemo infusion 275 mg, 125 mg/m2 = 275 mg, Intravenous,  Once, 8 of 9 cycles Dose modification: 100 mg/m2 (80 % of original dose 125 mg/m2, Cycle 1, Reason: Other (see comments), Comment: neutropenia), 93.75 mg/m2 (75 % of original dose 125 mg/m2, Cycle 1, Reason: Other (see comments), Comment: neutropenia), 100 mg/m2 (80 % of original dose 125 mg/m2, Cycle 2, Reason: Other (see comments), Comment: cytopenias with previous treatment), 75 mg/m2 (original dose 125 mg/m2, Cycle 3, Reason: Provider Judgment, Comment: neuropathy stated as 10/10 dose reduce required per NP Reynolds Bowl) Administration: 275 mg (05/17/2019), 225 mg (05/24/2019), 200 mg (05/31/2019), 225 mg (06/14/2019), 225 mg (06/28/2019), 225 mg (07/12/2019), 175 mg (07/19/2019), 175 mg (08/16/2019), 175 mg (08/31/2019), 175 mg (09/27/2019), 175 mg (10/11/2019), 175 mg (10/25/2019), 175 mg (11/08/2019), 175 mg (11/23/2019), 175 mg (12/08/2019), 175 mg (12/28/2019), 175 mg (01/19/2020) ondansetron (ZOFRAN) 8 mg in sodium chloride 0.9 % 50 mL IVPB, 8 mg (100 % of original dose 8 mg), Intravenous,  Once, 1 of 1 cycle Dose modification: 8 mg (original dose 8 mg, Cycle 1) gemcitabine (GEMZAR) 2,200 mg in sodium chloride 0.9 % 250 mL chemo infusion, 2,204 mg, Intravenous,  Once, 8 of 9 cycles Dose modification: 750 mg/m2 (75 % of original dose 1,000 mg/m2, Cycle 1, Reason: Other (see comments), Comment: neutropenia), 750 mg/m2 (75 % of  original dose 1,000 mg/m2, Cycle 1, Reason: Other (see comments), Comment: neutropenia), 800 mg/m2 (80 % of original dose 1,000 mg/m2, Cycle 2, Reason: Other (see comments), Comment: cytopenia) Administration: 2,200 mg (05/17/2019), 1,600 mg (05/24/2019), 1,600 mg (05/31/2019), 1,786 mg (06/14/2019), 1,786 mg (06/28/2019), 1,786 mg (07/12/2019), 1,786 mg (07/19/2019), 1,786 mg (08/16/2019), 1,786 mg (08/31/2019), 1,786 mg (09/27/2019), 1,786 mg (10/11/2019), 1,786 mg (10/25/2019), 1,786 mg (11/08/2019), 1,786 mg (11/23/2019), 1,786 mg (12/08/2019), 1,786 mg (12/28/2019), 1,786 mg (01/19/2020) ondansetron (ZOFRAN) 8 mg, dexamethasone (DECADRON) 10 mg in sodium chloride 0.9 % 50 mL IVPB, , Intravenous,  Once, 8 of 9 cycles Administration:  (05/17/2019),  (05/24/2019),  (05/31/2019),  (06/14/2019),  (06/28/2019),  (07/12/2019),  (07/19/2019),  (08/16/2019),  (08/31/2019),  (09/27/2019),  (10/11/2019),  (10/25/2019),  (11/08/2019),  (11/23/2019),  (12/08/2019),  (12/28/2019),  (01/19/2020)  for chemotherapy treatment.    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  The Common Hereditary Gene Panel offered by [facility] includes sequencing and/or deletion duplication testing of the following 48 genes: APC, ATM, AXIN2, BARD1, BMPR1A, BRCA1, BRCA2, BRIP1, CDH1, CDK4, CDKN2A (p14ARF), CDKN2A (p16INK4a), CHEK2, CTNNA1, DICER1, EPCAM (Deletion/duplication testing only), GREM1 (promoter region deletion/duplication testing only), KIT, MEN1, MLH1, MSH2, MSH3, MSH6, MUTYH, NBN, NF1, NHTL1, PALB2, PDGFRA, PMS2, POLD1, POLE, PTEN, RAD50, RAD51C, RAD51D, RNF43, SDHB, SDHC, SDHD, SMAD4, SMARCA4. STK11, TP53, TSC1, TSC2, and VHL.  The following genes were evaluated for sequence changes only: SDHA and HOXB13 c.251G>A variant only. The report date is August 02, 2019.   02/14/2020 -  Chemotherapy   The patient had palonosetron (ALOXI) injection 0.25 mg, 0.25  mg, Intravenous,  Once, 0 of 4  cycles fluorouracil (ADRUCIL) 5,600 mg in sodium chloride 0.9 % 138 mL chemo infusion, 2,400 mg/m2, Intravenous, 1 Day/Dose, 0 of 4 cycles irinotecan LIPOSOME (ONIVYDE) 163.4 mg in sodium chloride 0.9 % 500 mL chemo infusion, 70 mg/m2, Intravenous, Once, 0 of 4 cycles leucovorin 936 mg in sodium chloride 0.9 % 250 mL infusion, 400 mg/m2, Intravenous,  Once, 0 of 4 cycles  for chemotherapy treatment.       CANCER STAGING: Cancer Staging No matching staging information was found for the patient.   INTERVAL HISTORY:  Doris [name] 59 y.o. female seen for follow-up of metastatic pancreatic cancer to the liver and lungs.  She is here for toxicity assessment prior to next cycle of chemotherapy.  She had scans done on 02/07/2020.  Did not report any worsening of neuropathy.  No abdominal pain reported.  Had some mouth sores after last treatment which has improved.  Shortness of breath on exertion is also stable.  REVIEW OF SYSTEMS:  Review of Systems  HENT:   Positive for mouth sores.   Respiratory: Positive for shortness of breath.   Neurological: Positive for numbness.  All other systems reviewed and are negative.    PAST MEDICAL/SURGICAL HISTORY:  Past Medical History:  Diagnosis Date  . Diabetes mellitus without complication ([facility])   . Family history of prostate cancer   . Family history of stomach cancer   . Hypertension   . Pancreatic cancer ([place])   . Port-A-Cath in place 05/16/2019   Past Surgical History:  Procedure Laterality Date  . ABDOMINAL HYSTERECTOMY    . BACK SURGERY    . KIDNEY SURGERY     per pt, had leakage  . tubal ligation Left      SOCIAL HISTORY:  Social History   Socioeconomic History  . Marital status: Divorced    Spouse name: Not on file  . Number of children: 1  . Years of education: Not on file  . Highest education level: Not on file  Occupational History  . Occupation: disabled  Tobacco Use  . Smoking status: Current Every Day Smoker     Packs/day: 1.00    Years: 45.00    Pack years: 45.00    Types: Cigarettes  . Smokeless tobacco: Never Used  Substance and Sexual Activity  . Alcohol use: Not Currently  . Drug use: Never  . Sexual activity: Not on file  Other Topics Concern  . Not on file  Social History Narrative  . Not on file   Social Determinants of Health   Financial Resource Strain: Low Risk   . Difficulty of Paying Living Expenses: Not hard at all  Food Insecurity: No Food Insecurity  . Worried About Charity fundraiser in the Last Year: Never true  . Ran Out of Food in the Last Year: Never true  Transportation Needs: Unmet Transportation Needs  . Lack of Transportation (Medical): Yes  . Lack of Transportation (Non-Medical): Yes  Physical Activity: Inactive  . Days of Exercise per Week: 0 days  . Minutes of Exercise per Session: 0 min  Stress: No Stress Concern Present  . Feeling of Stress : Not at all  Social Connections: Somewhat Isolated  . Frequency of Communication with Friends and Family: More than three times a week  . Frequency of Social Gatherings with Friends and Family: More than three times a week  . Attends Religious Services: 1 to 4 times per year  . Active  Member of Clubs or Organizations: No  . Attends Archivist Meetings: Never  . Marital Status: Divorced  Human resources officer Violence: Not At Risk  . Fear of Current or Ex-Partner: No  . Emotionally Abused: No  . Physically Abused: No  . Sexually Abused: No    FAMILY HISTORY:  Family History  Problem Relation Age of Onset  . Diabetes Mother   . Hypertension Mother   . Lung cancer Mother 77       d. 8  . Diabetes Father   . Hypertension Father   . Heart disease Brother   . Stomach cancer Paternal Aunt 26  . Stroke Paternal Grandmother   . Prostate cancer Other        PGMs brother  . Stomach cancer Other        PGMs mother    CURRENT MEDICATIONS:  Outpatient Encounter Medications as of 02/09/2020  Medication  Sig  . amLODipine (NORVASC) 5 MG tablet Take 1 tablet (5 mg total) by mouth 2 (two) times daily.  . insulin aspart (NOVOLOG) 100 UNIT/ML injection Inject 20 Units into the skin 3 (three) times daily before meals.  . Insulin Syringe-Needle U-100 (INSULIN SYRINGE 1CC/31GX5/16") 31G X 5/16" 1 ML MISC   . lisinopril (ZESTRIL) 40 MG tablet Take 1 tablet (40 mg total) by mouth daily.  Marland Kitchen NEEDLE, DISP, 30 G (BD DISP NEEDLES) 30G X 1/2" MISC Use as directed with insulin three times daily  . oxyCODONE-acetaminophen (PERCOCET) 10-325 MG tablet Take 1 tablet by mouth every 4 (four) hours as needed for pain.  Marland Kitchen PACLitaxel Protein-Bound Part (ABRAXANE IV) Inject into the vein. Days 1, 8, 15 q 28 days  . WIXELA INHUB 250-50 MCG/DOSE AEPB INHALE 1 PUFF INTO THE LUNGS TWICE DAILY  . docusate sodium (COLACE) 100 MG capsule Take 2 capsules (200 mg total) by mouth at bedtime. (Patient not taking: Reported on 02/09/2020)  . gabapentin (NEURONTIN) 300 MG capsule TAKE 1 CAPSULE(300 MG) BY MOUTH THREE TIMES DAILY (Patient not taking: Reported on 02/09/2020)  . Gemcitabine HCl (GEMZAR IV) Inject into the vein. Days 1, 8, 15 q 28 days  . Lactulose 20 GM/30ML SOLN Take 30 mLs (20 g total) by mouth at bedtime as needed. (Patient not taking: Reported on 12/28/2019)  . levofloxacin (LEVAQUIN) 500 MG tablet Take 1 tablet (500 mg total) by mouth daily. (Patient not taking: Reported on 02/09/2020)  . linaclotide (LINZESS) 145 MCG CAPS capsule Take 1 capsule (145 mcg total) by mouth daily before breakfast. (Patient not taking: Reported on 02/09/2020)  . ondansetron (ZOFRAN) 4 MG tablet Take 1 tablet (4 mg total) by mouth 3 (three) times daily. (Patient not taking: Reported on 12/28/2019)  . [DISCONTINUED] lidocaine-prilocaine (EMLA) cream Apply small amount to port a cath site and cover with plastic wrap 1 hour prior to chemotherapy appointments (Patient not taking: Reported on 12/28/2019)  . [DISCONTINUED] prochlorperazine (COMPAZINE) 10  MG tablet Take 1 tablet (10 mg total) by mouth every 6 (six) hours as needed (Nausea or vomiting). (Patient not taking: Reported on 10/25/2019)   No facility-administered encounter medications on file as of 02/09/2020.    ALLERGIES:  Allergies  Allergen Reactions  . Hydrocodone Hives  . Tramadol Itching     PHYSICAL EXAM:  ECOG Performance status: 1  Vitals:   02/09/20 0929  BP: (!) 111/38  Pulse: 93  Resp: 20  Temp: (!) 96.2 F (35.7 C)  SpO2: 95%   Filed Weights   02/09/20 0929  Weight: 244 lb 8 oz (110.9 kg)    Physical Exam Vitals reviewed.  Constitutional:      Appearance: Normal appearance.  Cardiovascular:     Rate and Rhythm: Normal rate and regular rhythm.     Heart sounds: Normal heart sounds.  Pulmonary:     Effort: Pulmonary effort is normal.     Breath sounds: Normal breath sounds.  Abdominal:     General: There is no distension.     Palpations: Abdomen is soft. There is no mass.  Musculoskeletal:        General: No swelling.  Lymphadenopathy:     Cervical: No cervical adenopathy.  Skin:    General: Skin is warm.  Neurological:     General: No focal deficit present.     Mental Status: She is alert and oriented to person, place, and time.  Psychiatric:        Mood and Affect: Mood normal.        Behavior: Behavior normal.     LABORATORY DATA:  I have reviewed the labs as listed.  CBC    Component Value Date/Time   WBC 7.0 02/09/2020 0924   RBC 4.99 02/09/2020 0924   HGB 12.5 02/09/2020 0924   HCT 40.8 02/09/2020 0924   PLT 245 02/09/2020 0924   MCV 81.8 02/09/2020 0924   MCH 25.1 (L) 02/09/2020 0924   MCHC 30.6 02/09/2020 0924   RDW 19.4 (H) 02/09/2020 0924   LYMPHSABS 2.2 02/09/2020 0924   MONOABS 0.8 02/09/2020 0924   EOSABS 0.3 02/09/2020 0924   BASOSABS 0.0 02/09/2020 0924   CMP Latest Ref Rng & Units 02/09/2020 01/19/2020 12/28/2019  Glucose 70 - 99 mg/dL 138(H) 333(H) 277(H)  BUN 6 - 20 mg/dL '10 9 14  ' Creatinine 0.44 - 1.00  mg/dL 0.74 0.77 0.86  Sodium 135 - 145 mmol/L 137 131(L) 136  Potassium 3.5 - 5.1 mmol/L 3.8 3.6 3.8  Chloride 98 - 111 mmol/L 99 94(L) 96(L)  CO2 22 - 32 mmol/L '27 24 29  ' Calcium 8.9 - 10.3 mg/dL 8.7(L) 9.1 9.0  Total Protein 6.5 - 8.1 g/dL 6.3(L) 6.8 6.2(L)  Total Bilirubin 0.3 - 1.2 mg/dL 0.3 0.4 0.5  Alkaline Phos 38 - 126 U/L 85 80 87  AST 15 - 41 U/L '25 26 20  ' ALT 0 - 44 U/L '21 25 18       ' DIAGNOSTIC IMAGING:  I have independently reviewed scans and images with the patient.   I have reviewed Venita Lick LPN's note and agree with the documentation.  I personally performed a face-to-face visit, made revisions and my assessment and plan is as follows.    ASSESSMENT & PLAN:   Pancreatic cancer metastasized to liver ([place]) 1.  Metastatic pancreatic cancer to the liver, MS-stable: -8 cycles of gemcitabine and Abraxane day 1 and day 15 from 05/17/2019 through 12/28/2019 with progression. -CA 19-9 was 3 on 11/23/2019. -We reviewed results of the CT CAP from 02/07/2020.  Subcentimeter new pulmonary nodules were seen.  Pancreatic tail mass measures 5.5 x 3.7 cm compared to 3.2 x 1.9 cm on prior scan.  Hepatic steatosis with lobular contours.  Low-density lesion in the posterior right hemiliver 11 mm, previously 9 mm.  Some nodularity in the sigmoid mesocolon and along the right hepatic margin just below the gallbladder fossa. -I have recommended discontinuing current chemotherapy.  I will switch her to 5-FU and Onivyde.  She has received FOLFIRINOX 2 cycles at a different facility prior  to start of gemcitabine-based regimen but she did not have any true progression. -We talked about the side effects of chemotherapy regimen.  She did have abdominal cramping with FOLFIRINOX.  Hence I have recommended atropine in the premedications.  I will have also sent a prescription for Imodium with directions on it.  We will get insurance authorization.  I will likely start lower dose of Onivyde to see how  she tolerates. -Tentatively we will plan to start her next week.  I have reviewed her labs which showed normal LFTs and CBC.  2.  Peripheral neuropathy: -Continue gabapentin 300 mg 3 times a day.  No worsening of numbness.  3.  Constipation: -She is continuing Colace 200 mg at bedtime and taking lactulose as needed.  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  However the current scan does not show any occlusion.  She does not have any abdominal pain.  5.  Hypertension: -She is taking Norvasc 5 mg daily.  Blood pressure is 110/38.      Orders placed this encounter:  No orders of the defined types were placed in this encounter.  Total time spent is 40 minutes with more than 70% of the time spent face-to-face discussing scan results, review of images, change in treatment plan, side effects, counseling and coordination of care.   Derek Jack, MD [place] [phone]

## 2020-02-09 NOTE — Assessment & Plan Note (Addendum)
1.  Metastatic pancreatic cancer to the liver, MS-stable: -8 cycles of gemcitabine and Abraxane day 1 and day 15 from 05/17/2019 through 12/28/2019 with progression. -CA 19-9 was 3 on 11/23/2019. -We reviewed results of the CT CAP from 02/07/2020.  Subcentimeter new pulmonary nodules were seen.  Pancreatic tail mass measures 5.5 x 3.7 cm compared to 3.2 x 1.9 cm on prior scan.  Hepatic steatosis with lobular contours.  Low-density lesion in the posterior right hemiliver 11 mm, previously 9 mm.  Some nodularity in the sigmoid mesocolon and along the right hepatic margin just below the gallbladder fossa. -I have recommended discontinuing current chemotherapy.  I will switch her to 5-FU and Onivyde.  She has received FOLFIRINOX 2 cycles at a different facility prior to start of gemcitabine-based regimen but she did not have any true progression. -We talked about the side effects of chemotherapy regimen.  She did have abdominal cramping with FOLFIRINOX.  Hence I have recommended atropine in the premedications.  I will have also sent a prescription for Imodium with directions on it.  We will get insurance authorization.  I will likely start lower dose of Onivyde to see how she tolerates. -Tentatively we will plan to start her next week.  I have reviewed her labs which showed normal LFTs and CBC.  2.  Peripheral neuropathy: -Continue gabapentin 300 mg 3 times a day.  No worsening of numbness.  3.  Constipation: -She is continuing Colace 200 mg at bedtime and taking lactulose as needed.  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  However the current scan does not show any occlusion.  She does not have any abdominal pain.  5.  Hypertension: -She is taking Norvasc 5 mg daily.  Blood pressure is 110/38.

## 2020-02-09 NOTE — Progress Notes (Signed)
Patient has been assessed, vital signs and labs have been reviewed by Dr. Delton Coombes.HOLD Tx today due to progression on last scan. He will change her treatment to 5FU and Onivyde.

## 2020-02-09 NOTE — Patient Instructions (Addendum)
[place] at [facility] Discharge Instructions  You were seen today by Dr. Delton Coombes. He went over your recent lab and scan results.The scan shows that the spot on your pancreas has grown. Because of this we need to change your treatment. He will start you on 5FU and Onivyde.  He will see you back in 1 week for labs, treatment and follow up.   Thank you for choosing [place] at [facility] to provide your oncology and hematology care.  To afford each patient quality time with our provider, please arrive at least 15 minutes before your scheduled appointment time.   If you have a lab appointment with the [facility] please come in thru the  Main Entrance and check in at the main information desk  You need to re-schedule your appointment should you arrive 10 or more minutes late.  We strive to give you quality time with our providers, and arriving late affects you and other patients whose appointments are after yours.  Also, if you no show three or more times for appointments you may be dismissed from the clinic at the providers discretion.     Again, thank you for choosing [facility].  Our hope is that these requests will decrease the amount of time that you wait before being seen by our physicians.       _____________________________________________________________  Should you have questions after your visit to [facility], please contact our office at [phone] between the hours of 8:00 a.m. and 4:30 p.m.  Voicemails left after 4:00 p.m. will not be returned until the following business day.  For prescription refill requests, have your pharmacy contact our office and allow 72 hours.    Cancer Center Support Programs:   > Cancer Support Group  2nd Tuesday of the month 1pm-2pm, Journey Room

## 2020-02-09 NOTE — Progress Notes (Signed)
DISCONTINUE ON PATHWAY REGIMEN - Pancreatic Adenocarcinoma     A cycle is every 28 days:     Nab-paclitaxel (protein bound)      Gemcitabine   **Always confirm dose/schedule in your pharmacy ordering system**  REASON: Disease Progression PRIOR TREATMENT: PANOS51: Nab-Paclitaxel (Abraxane) 125 mg/m2 D1, 8, 15 + Gemcitabine 1,000 mg/m2 D1, 8, 15 q28 Days Until Progression or Toxicity TREATMENT RESPONSE: Progressive Disease (PD)  START ON PATHWAY REGIMEN - Pancreatic Adenocarcinoma     A cycle is every 14 days:     Liposomal irinotecan      Leucovorin      Fluorouracil   **Always confirm dose/schedule in your pharmacy ordering system**  Patient Characteristics: Metastatic Disease, Second Line, MSS/pMMR or MSI Unknown, Gemcitabine-Based Therapy First Line Therapeutic Status: Metastatic Disease Line of Therapy: Second Line Microsatellite/Mismatch Repair Status: MSS/pMMR Intent of Therapy: Non-Curative / Palliative Intent, Discussed with Patient 

## 2020-02-14 ENCOUNTER — Other Ambulatory Visit (HOSPITAL_COMMUNITY): Payer: Self-pay

## 2020-02-14 ENCOUNTER — Other Ambulatory Visit (HOSPITAL_COMMUNITY): Payer: Self-pay | Admitting: Hematology

## 2020-02-14 DIAGNOSIS — C259 Malignant neoplasm of pancreas, unspecified: Secondary | ICD-10-CM

## 2020-02-14 DIAGNOSIS — C787 Secondary malignant neoplasm of liver and intrahepatic bile duct: Secondary | ICD-10-CM

## 2020-02-14 NOTE — Progress Notes (Signed)
Doris [name], [place] [address]   CLINIC:  Medical Oncology/Hematology  PCP:  Merdis Delay, DO [address] / [place] [address] [phone]   REASON FOR VISIT:  Follow-up for metastatic pancreatic cancer to the liver  CURRENT THERAPY: 5-FU and Onivyde.  BRIEF ONCOLOGIC HISTORY:  Oncology History  Pancreatic cancer metastasized to liver ([place])  05/09/2019 Initial Diagnosis   Pancreatic cancer metastasized to liver ([place])   05/17/2019 - 02/08/2020 Chemotherapy   The patient had PACLitaxel-protein bound (ABRAXANE) chemo infusion 275 mg, 125 mg/m2 = 275 mg, Intravenous,  Once, 8 of 9 cycles Dose modification: 100 mg/m2 (80 % of original dose 125 mg/m2, Cycle 1, Reason: Other (see comments), Comment: neutropenia), 93.75 mg/m2 (75 % of original dose 125 mg/m2, Cycle 1, Reason: Other (see comments), Comment: neutropenia), 100 mg/m2 (80 % of original dose 125 mg/m2, Cycle 2, Reason: Other (see comments), Comment: cytopenias with previous treatment), 75 mg/m2 (original dose 125 mg/m2, Cycle 3, Reason: Provider Judgment, Comment: neuropathy stated as 10/10 dose reduce required per NP Reynolds Bowl) Administration: 275 mg (05/17/2019), 225 mg (05/24/2019), 200 mg (05/31/2019), 225 mg (06/14/2019), 225 mg (06/28/2019), 225 mg (07/12/2019), 175 mg (07/19/2019), 175 mg (08/16/2019), 175 mg (08/31/2019), 175 mg (09/27/2019), 175 mg (10/11/2019), 175 mg (10/25/2019), 175 mg (11/08/2019), 175 mg (11/23/2019), 175 mg (12/08/2019), 175 mg (12/28/2019), 175 mg (01/19/2020) ondansetron (ZOFRAN) 8 mg in sodium chloride 0.9 % 50 mL IVPB, 8 mg (100 % of original dose 8 mg), Intravenous,  Once, 1 of 1 cycle Dose modification: 8 mg (original dose 8 mg, Cycle 1) gemcitabine (GEMZAR) 2,200 mg in sodium chloride 0.9 % 250 mL chemo infusion, 2,204 mg, Intravenous,  Once, 8 of 9 cycles Dose modification: 750 mg/m2 (75 % of original dose 1,000 mg/m2, Cycle 1, Reason: Other (see comments), Comment:  neutropenia), 750 mg/m2 (75 % of original dose 1,000 mg/m2, Cycle 1, Reason: Other (see comments), Comment: neutropenia), 800 mg/m2 (80 % of original dose 1,000 mg/m2, Cycle 2, Reason: Other (see comments), Comment: cytopenia) Administration: 2,200 mg (05/17/2019), 1,600 mg (05/24/2019), 1,600 mg (05/31/2019), 1,786 mg (06/14/2019), 1,786 mg (06/28/2019), 1,786 mg (07/12/2019), 1,786 mg (07/19/2019), 1,786 mg (08/16/2019), 1,786 mg (08/31/2019), 1,786 mg (09/27/2019), 1,786 mg (10/11/2019), 1,786 mg (10/25/2019), 1,786 mg (11/08/2019), 1,786 mg (11/23/2019), 1,786 mg (12/08/2019), 1,786 mg (12/28/2019), 1,786 mg (01/19/2020) ondansetron (ZOFRAN) 8 mg, dexamethasone (DECADRON) 10 mg in sodium chloride 0.9 % 50 mL IVPB, , Intravenous,  Once, 8 of 9 cycles Administration:  (05/17/2019),  (05/24/2019),  (05/31/2019),  (06/14/2019),  (06/28/2019),  (07/12/2019),  (07/19/2019),  (08/16/2019),  (08/31/2019),  (09/27/2019),  (10/11/2019),  (10/25/2019),  (11/08/2019),  (11/23/2019),  (12/08/2019),  (12/28/2019),  (01/19/2020)  for chemotherapy treatment.    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  The Common Hereditary Gene Panel offered by [facility] includes sequencing and/or deletion duplication testing of the following 48 genes: APC, ATM, AXIN2, BARD1, BMPR1A, BRCA1, BRCA2, BRIP1, CDH1, CDK4, CDKN2A (p14ARF), CDKN2A (p16INK4a), CHEK2, CTNNA1, DICER1, EPCAM (Deletion/duplication testing only), GREM1 (promoter region deletion/duplication testing only), KIT, MEN1, MLH1, MSH2, MSH3, MSH6, MUTYH, NBN, NF1, NHTL1, PALB2, PDGFRA, PMS2, POLD1, POLE, PTEN, RAD50, RAD51C, RAD51D, RNF43, SDHB, SDHC, SDHD, SMAD4, SMARCA4. STK11, TP53, TSC1, TSC2, and VHL.  The following genes were evaluated for sequence changes only: SDHA and HOXB13 c.251G>A variant only. The report date is August 02, 2019.   02/15/2020 -  Chemotherapy   The patient had  palonosetron (ALOXI) injection 0.25 mg, 0.25  mg, Intravenous,  Once, 1 of 4 cycles Administration: 0.25 mg (02/15/2020) fluorouracil (ADRUCIL) 4,500 mg in sodium chloride 0.9 % 60 mL chemo infusion, 1,920 mg/m2 = 4,500 mg (80 % of original dose 2,400 mg/m2), Intravenous, 1 Day/Dose, 1 of 4 cycles Dose modification: 1,920 mg/m2 (80 % of original dose 2,400 mg/m2, Cycle 1, Reason: Provider Judgment) Administration: 4,500 mg (02/15/2020) irinotecan LIPOSOME (ONIVYDE) 129 mg in sodium chloride 0.9 % 500 mL chemo infusion, 56 mg/m2 = 129 mg (80 % of original dose 70 mg/m2), Intravenous, Once, 1 of 4 cycles Dose modification: 56 mg/m2 (80 % of original dose 70 mg/m2, Cycle 1, Reason: Provider Judgment), 56 mg/m2 (80 % of original dose 70 mg/m2, Cycle 1, Reason: Provider Judgment) Administration: 129 mg (02/15/2020) leucovorin 748 mg in sodium chloride 0.9 % 250 mL infusion, 320 mg/m2 = 748 mg (80 % of original dose 400 mg/m2), Intravenous,  Once, 1 of 4 cycles Dose modification: 320 mg/m2 (80 % of original dose 400 mg/m2, Cycle 1, Reason: Provider Judgment) Administration: 748 mg (02/15/2020)  for chemotherapy treatment.      CANCER STAGING: Cancer Staging No matching staging information was found for the patient.  INTERVAL HISTORY:  Doris [name], a 59 y.o. female, returns for routine follow-up and for chemotherapy consideration. Doris [name] was last seen on 02/09/2020.   Today is cycle #1 of 5-FU and Onivyde.  She notes that she feels "blegh." She had extreme fatigue and appetite change; she notes that she has not ate in the last few days. She notes that she does have some severe cramping in her upper abdomen when taking chemo.    REVIEW OF SYSTEMS:  Review of Systems  Constitutional: Positive for appetite change and fatigue. Negative for chills, diaphoresis, fever and unexpected weight change.  HENT:   Negative for mouth sores, sore throat and trouble swallowing.   Eyes: Negative for eye problems.  Respiratory: Negative for cough,  shortness of breath and wheezing.   Cardiovascular: Negative for chest pain, leg swelling and palpitations.  Gastrointestinal: Negative for abdominal pain, constipation, diarrhea, nausea and vomiting.  Genitourinary: Negative for bladder incontinence, dysuria and frequency.   Musculoskeletal: Negative for arthralgias, back pain and myalgias.  Skin: Negative for rash.  Neurological: Positive for numbness (bottom of feet). Negative for dizziness, extremity weakness and headaches.  Hematological: Does not bruise/bleed easily.  Psychiatric/Behavioral: Negative for depression and sleep disturbance. The patient is not nervous/anxious.     PAST MEDICAL/SURGICAL HISTORY:  Past Medical History:  Diagnosis Date  . Diabetes mellitus without complication ([facility])   . Family history of prostate cancer   . Family history of stomach cancer   . Hypertension   . Pancreatic cancer ([place])   . Port-A-Cath in place 05/16/2019   Past Surgical History:  Procedure Laterality Date  . ABDOMINAL HYSTERECTOMY    . BACK SURGERY    . KIDNEY SURGERY     per pt, had leakage  . tubal ligation Left     SOCIAL HISTORY:  Social History   Socioeconomic History  . Marital status: Divorced    Spouse name: Not on file  . Number of children: 1  . Years of education: Not on file  . Highest education level: Not on file  Occupational History  . Occupation: disabled  Tobacco Use  . Smoking status: Current Every Day Smoker    Packs/day: 1.00    Years: 45.00    Pack years: 45.00  Types: Cigarettes  . Smokeless tobacco: Never Used  Substance and Sexual Activity  . Alcohol use: Not Currently  . Drug use: Never  . Sexual activity: Not on file  Other Topics Concern  . Not on file  Social History Narrative  . Not on file   Social Determinants of Health   Financial Resource Strain: Low Risk   . Difficulty of Paying Living Expenses: Not hard at all  Food Insecurity: No Food Insecurity  . Worried About Paediatric nurse in the Last Year: Never true  . Ran Out of Food in the Last Year: Never true  Transportation Needs: Unmet Transportation Needs  . Lack of Transportation (Medical): Yes  . Lack of Transportation (Non-Medical): Yes  Physical Activity: Inactive  . Days of Exercise per Week: 0 days  . Minutes of Exercise per Session: 0 min  Stress: No Stress Concern Present  . Feeling of Stress : Not at all  Social Connections: Somewhat Isolated  . Frequency of Communication with Friends and Family: More than three times a week  . Frequency of Social Gatherings with Friends and Family: More than three times a week  . Attends Religious Services: 1 to 4 times per year  . Active Member of Clubs or Organizations: No  . Attends Archivist Meetings: Never  . Marital Status: Divorced  Human resources officer Violence: Not At Risk  . Fear of Current or Ex-Partner: No  . Emotionally Abused: No  . Physically Abused: No  . Sexually Abused: No    FAMILY HISTORY:  Family History  Problem Relation Age of Onset  . Diabetes Mother   . Hypertension Mother   . Lung cancer Mother 52       d. 74  . Diabetes Father   . Hypertension Father   . Heart disease Brother   . Stomach cancer Paternal Aunt 65  . Stroke Paternal Grandmother   . Prostate cancer Other        PGMs brother  . Stomach cancer Other        PGMs mother    CURRENT MEDICATIONS:  Current Outpatient Medications  Medication Sig Dispense Refill  . amLODipine (NORVASC) 5 MG tablet Take 1 tablet (5 mg total) by mouth 2 (two) times daily. 90 tablet 3  . docusate sodium (COLACE) 100 MG capsule Take 2 capsules (200 mg total) by mouth at bedtime. 10 capsule 0  . Gemcitabine HCl (GEMZAR IV) Inject into the vein. Days 1, 8, 15 q 28 days    . insulin aspart (NOVOLOG) 100 UNIT/ML injection Inject 20 Units into the skin 3 (three) times daily before meals. 10 mL 3  . Insulin Syringe-Needle U-100 (INSULIN SYRINGE 1CC/31GX5/16") 31G X 5/16" 1 ML  MISC     . Lactulose 20 GM/30ML SOLN Take 30 mLs (20 g total) by mouth at bedtime as needed. 450 mL 2  . lisinopril (ZESTRIL) 40 MG tablet Take 1 tablet (40 mg total) by mouth daily. 90 tablet 3  . NEEDLE, DISP, 30 G (BD DISP NEEDLES) 30G X 1/2" MISC Use as directed with insulin three times daily 100 each 6  . ondansetron (ZOFRAN) 4 MG tablet TAKE 1 TABLET(4 MG) BY MOUTH THREE TIMES DAILY 20 tablet 3  . oxyCODONE-acetaminophen (PERCOCET) 10-325 MG tablet Take 1 tablet by mouth every 4 (four) hours as needed for pain.    Marland Kitchen PACLitaxel Protein-Bound Part (ABRAXANE IV) Inject into the vein. Days 1, 8, 15 q 28 days    .  WIXELA INHUB 250-50 MCG/DOSE AEPB INHALE 1 PUFF INTO THE LUNGS TWICE DAILY 60 each 2  . diphenoxylate-atropine (LOMOTIL) 2.5-0.025 MG tablet Take 1 tablet by mouth 4 (four) times daily as needed for diarrhea or loose stools. 30 tablet 3  . loperamide (IMODIUM A-D) 2 MG tablet Take 2 at diarrhea onset , then 1 after every watery BM. Please call if not helping 100 tablet 1   No current facility-administered medications for this visit.   Facility-Administered Medications Ordered in Other Visits  Medication Dose Route Frequency Provider Last Rate Last Admin  . sodium chloride flush (NS) 0.9 % injection 10 mL  10 mL Intracatheter PRN Derek Jack, MD   10 mL at 02/17/20 1201    ALLERGIES:  Allergies  Allergen Reactions  . Hydrocodone Hives  . Tramadol Itching    PHYSICAL EXAM:  Performance status (ECOG): 1 - Symptomatic but completely ambulatory  Vitals:   02/15/20 0808  BP: (!) 150/77  Pulse: 97  Resp: 18  Temp: (!) 96 F (35.6 C)  SpO2: 98%   Wt Readings from Last 3 Encounters:  02/15/20 237 lb 8 oz (107.7 kg)  02/09/20 244 lb 8 oz (110.9 kg)  01/19/20 239 lb (108.4 kg)   Physical Exam Constitutional:      General: She is not in acute distress.    Appearance: Normal appearance. She is normal weight. She is not ill-appearing.  HENT:     Mouth/Throat:      Mouth: Mucous membranes are moist.     Pharynx: No oropharyngeal exudate or posterior oropharyngeal erythema.  Eyes:     Extraocular Movements: Extraocular movements intact.     Pupils: Pupils are equal, round, and reactive to light.  Cardiovascular:     Rate and Rhythm: Normal rate and regular rhythm.     Pulses: Normal pulses.     Heart sounds: Normal heart sounds. No murmur. No friction rub. No gallop.   Pulmonary:     Effort: Pulmonary effort is normal.     Breath sounds: Normal breath sounds. No wheezing, rhonchi or rales.  Abdominal:     Palpations: There is no mass.     Tenderness: There is no abdominal tenderness. There is no guarding.  Musculoskeletal:        General: No swelling or tenderness.     Right lower leg: No edema.     Left lower leg: No edema.  Skin:    Findings: No bruising or erythema.  Neurological:     Mental Status: She is alert and oriented to person, place, and time.     Sensory: No sensory deficit.  Psychiatric:        Mood and Affect: Mood normal.        Behavior: Behavior normal.        Thought Content: Thought content normal.        Judgment: Judgment normal.      LABORATORY DATA:  I have reviewed the labs as listed.  CBC Latest Ref Rng & Units 02/15/2020 02/09/2020 01/19/2020  WBC 4.0 - 10.5 K/uL 8.5 7.0 8.0  Hemoglobin 12.0 - 15.0 g/dL 14.3 12.5 13.4  Hematocrit 36.0 - 46.0 % 46.2(H) 40.8 43.1  Platelets 150 - 400 K/uL 235 245 274   CMP Latest Ref Rng & Units 02/15/2020 02/09/2020 01/19/2020  Glucose 70 - 99 mg/dL 300(H) 138(H) 333(H)  BUN 6 - 20 mg/dL '11 10 9  ' Creatinine 0.44 - 1.00 mg/dL 0.77 0.74 0.77  Sodium 135 -  145 mmol/L 134(L) 137 131(L)  Potassium 3.5 - 5.1 mmol/L 3.8 3.8 3.6  Chloride 98 - 111 mmol/L 96(L) 99 94(L)  CO2 22 - 32 mmol/L '23 27 24  ' Calcium 8.9 - 10.3 mg/dL 9.2 8.7(L) 9.1  Total Protein 6.5 - 8.1 g/dL 7.0 6.3(L) 6.8  Total Bilirubin 0.3 - 1.2 mg/dL 0.7 0.3 0.4  Alkaline Phos 38 - 126 U/L 88 85 80  AST 15 - 41 U/L '27  25 26  ' ALT 0 - 44 U/L '23 21 25    ' DIAGNOSTIC IMAGING:  I have independently reviewed the scans and discussed with the patient.  ASSESSMENT & PLAN:  Pancreatic cancer metastasized to liver ([place]) 1.  Metastatic pancreatic cancer to the liver, MSI stable: -8 cycles of gemcitabine and Abraxane from 05/17/2019 through 12/28/2019 with progression. -Last CA 19-9 was 3 on 11/23/2019. -CT CAP on 02/07/2020 showed subcentimeter new pulmonary nodules seen.  Pancreatic tail mass measures 5.5 x 3.7 cm compared to 3.2 x 1.9 cm on prior scan.  Hepatic steatosis with lobular contours.  Low-density lesion in the posterior right hemiliver 11 mm, previously 9 mm.  Some nodularity in the sigmoid mesocolon and along the right hepatic margin just below the gallbladder fossa. -We have recommended change in therapy.  We will use 5-FU and Onivyde regimen.  She had abdominal cramping with FOLFIRINOX. -We will start her on 20% dose reduction.  We will add atropine as premedication.  I have also sent a prescription for Lomotil to be taken as needed.  We discussed the side effects in detail.  We will see her back in 2 weeks for follow-up.  2.  Peripheral neuropathy: -Continue gabapentin 300 mg 3 times a day.  No worsening of numbness.  3.  Constipation: -Continue Colace 200 mg at bedtime and take lactulose as needed.  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  Current scan did not show any occlusion.  She does not report any abdominal pain.  5.  Hypertension: -She is taking Norvasc 5 mg daily.  Blood pressure is 150/77.    Orders placed this encounter:  Orders Placed This Encounter  Procedures  . CBC with Differential/Platelet  . Comprehensive metabolic panel  . Magnesium        Derek Jack, MD, 02/17/20 5:13 PM  [place] [phone]   I, Jacqualyn Posey, am acting as a scribe for Dr. Sanda Linger.  I, Derek Jack MD, have  reviewed the above documentation for accuracy and completeness, and I agree with the above.

## 2020-02-14 NOTE — Progress Notes (Signed)
.   Pharmacist Chemotherapy Monitoring - Initial Assessment    Anticipated start date: 02/15/2020   Regimen:  . Are orders appropriate based on the patient's diagnosis, regimen, and cycle? Yes . Does the plan date match the patient's scheduled date? Yes . Is the sequencing of drugs appropriate? Yes . Are the premedications appropriate for the patient's regimen? Yes . Prior Authorization for treatment is: Approved o If applicable, is the correct biosimilar selected based on the patient's insurance? not applicable  Organ Function and Labs: Marland Kitchen Are dose adjustments needed based on the patient's renal function, hepatic function, or hematologic function? No . Are appropriate labs ordered prior to the start of patient's treatment? Yes . Other organ system assessment, if indicated: N/A . The following baseline labs, if indicated, have been ordered: N/A  Dose Assessment: . Are the drug doses appropriate? Yes . Are the following correct: o Drug concentrations Yes o IV fluid compatible with drug Yes o Administration routes Yes o Timing of therapy Yes . If applicable, does the patient have documented access for treatment and/or plans for port-a-cath placement? yes . If applicable, have lifetime cumulative doses been properly documented and assessed? no Lifetime Dose Tracking  No doses have been documented on this patient for the following tracked chemicals: Doxorubicin, Epirubicin, Idarubicin, Daunorubicin, Mitoxantrone, Bleomycin, Oxaliplatin, Carboplatin, Liposomal Doxorubicin  o   Toxicity Monitoring/Prevention: . The patient has the following take home antiemetics prescribed: Ondansetron . The patient has the following take home medications prescribed: N/A . Medication allergies and previous infusion related reactions, if applicable, have been reviewed and addressed. Yes . The patient's current medication list has been assessed for drug-drug interactions with their chemotherapy regimen. no  significant drug-drug interactions were identified on review.  Order Review: . Are the treatment plan orders signed? No . Is the patient scheduled to see a provider prior to their treatment? Yes  I verify that I have reviewed each item in the above checklist and answered each question accordingly.  Wynona Neat 02/14/2020 2:56 PM

## 2020-02-15 ENCOUNTER — Encounter (HOSPITAL_COMMUNITY): Payer: Self-pay | Admitting: Hematology

## 2020-02-15 ENCOUNTER — Inpatient Hospital Stay (HOSPITAL_COMMUNITY): Payer: Medicare HMO

## 2020-02-15 ENCOUNTER — Other Ambulatory Visit: Payer: Self-pay

## 2020-02-15 ENCOUNTER — Inpatient Hospital Stay (HOSPITAL_BASED_OUTPATIENT_CLINIC_OR_DEPARTMENT_OTHER): Payer: Medicare HMO | Admitting: Hematology

## 2020-02-15 VITALS — BP 154/79 | HR 90 | Temp 96.9°F | Resp 18

## 2020-02-15 DIAGNOSIS — Z5111 Encounter for antineoplastic chemotherapy: Secondary | ICD-10-CM | POA: Diagnosis not present

## 2020-02-15 DIAGNOSIS — Z95828 Presence of other vascular implants and grafts: Secondary | ICD-10-CM

## 2020-02-15 DIAGNOSIS — C787 Secondary malignant neoplasm of liver and intrahepatic bile duct: Secondary | ICD-10-CM

## 2020-02-15 DIAGNOSIS — C259 Malignant neoplasm of pancreas, unspecified: Secondary | ICD-10-CM

## 2020-02-15 LAB — COMPREHENSIVE METABOLIC PANEL
ALT: 23 U/L (ref 0–44)
AST: 27 U/L (ref 15–41)
Albumin: 3.9 g/dL (ref 3.5–5.0)
Alkaline Phosphatase: 88 U/L (ref 38–126)
Anion gap: 15 (ref 5–15)
BUN: 11 mg/dL (ref 6–20)
CO2: 23 mmol/L (ref 22–32)
Calcium: 9.2 mg/dL (ref 8.9–10.3)
Chloride: 96 mmol/L — ABNORMAL LOW (ref 98–111)
Creatinine, Ser: 0.77 mg/dL (ref 0.44–1.00)
GFR calc Af Amer: >60 mL/min (ref >60)
GFR calc non Af Amer: >60 mL/min (ref >60)
Glucose, Bld: 300 mg/dL — ABNORMAL HIGH (ref 70–99)
Potassium: 3.8 mmol/L (ref 3.5–5.1)
Sodium: 134 mmol/L — ABNORMAL LOW (ref 135–145)
Total Bilirubin: 0.7 mg/dL (ref 0.3–1.2)
Total Protein: 7 g/dL (ref 6.5–8.1)

## 2020-02-15 LAB — CBC WITH DIFFERENTIAL/PLATELET
Abs Immature Granulocytes: 0.05 10*3/uL (ref 0.00–0.07)
Basophils Absolute: 0 10*3/uL (ref 0.0–0.1)
Basophils Relative: 0 %
Eosinophils Absolute: 0.2 10*3/uL (ref 0.0–0.5)
Eosinophils Relative: 2 %
HCT: 46.2 % — ABNORMAL HIGH (ref 36.0–46.0)
Hemoglobin: 14.3 g/dL (ref 12.0–15.0)
Immature Granulocytes: 1 %
Lymphocytes Relative: 27 %
Lymphs Abs: 2.3 10*3/uL (ref 0.7–4.0)
MCH: 24.8 pg — ABNORMAL LOW (ref 26.0–34.0)
MCHC: 31 g/dL (ref 30.0–36.0)
MCV: 80.2 fL (ref 80.0–100.0)
Monocytes Absolute: 0.7 10*3/uL (ref 0.1–1.0)
Monocytes Relative: 8 %
Neutro Abs: 5.3 10*3/uL (ref 1.7–7.7)
Neutrophils Relative %: 62 %
Platelets: 235 10*3/uL (ref 150–400)
RBC: 5.76 MIL/uL — ABNORMAL HIGH (ref 3.87–5.11)
RDW: 18.7 % — ABNORMAL HIGH (ref 11.5–15.5)
WBC: 8.5 10*3/uL (ref 4.0–10.5)
nRBC: 0 % (ref 0.0–0.2)

## 2020-02-15 MED ORDER — SODIUM CHLORIDE 0.9 % IV SOLN
320.0000 mg/m2 | Freq: Once | INTRAVENOUS | Status: AC
  Administered 2020-02-15: 748 mg via INTRAVENOUS
  Filled 2020-02-15: qty 35

## 2020-02-15 MED ORDER — SODIUM CHLORIDE 0.9 % IV SOLN
10.0000 mg | Freq: Once | INTRAVENOUS | Status: AC
  Administered 2020-02-15: 10 mg via INTRAVENOUS
  Filled 2020-02-15: qty 10

## 2020-02-15 MED ORDER — LOPERAMIDE HCL 2 MG PO TABS: ORAL_TABLET | ORAL | 1 refills | Status: DC

## 2020-02-15 MED ORDER — ATROPINE SULFATE 1 MG/ML IJ SOLN
1.0000 mg | Freq: Once | INTRAMUSCULAR | Status: AC
  Administered 2020-02-15: 1 mg via INTRAVENOUS
  Filled 2020-02-15: qty 1

## 2020-02-15 MED ORDER — SODIUM CHLORIDE 0.9 % IV SOLN
56.0000 mg/m2 | Freq: Once | INTRAVENOUS | Status: AC
  Administered 2020-02-15: 129 mg via INTRAVENOUS
  Filled 2020-02-15: qty 30

## 2020-02-15 MED ORDER — SODIUM CHLORIDE 0.9% FLUSH
10.0000 mL | INTRAVENOUS | Status: DC | PRN
  Administered 2020-02-15: 10 mL

## 2020-02-15 MED ORDER — DIPHENOXYLATE-ATROPINE 2.5-0.025 MG PO TABS
1.0000 | ORAL_TABLET | Freq: Four times a day (QID) | ORAL | 3 refills | Status: DC | PRN
Start: 2020-02-15 — End: 2020-06-27

## 2020-02-15 MED ORDER — SODIUM CHLORIDE 0.9 % IV SOLN
1920.0000 mg/m2 | INTRAVENOUS | Status: DC
  Administered 2020-02-15: 4500 mg via INTRAVENOUS
  Filled 2020-02-15: qty 90

## 2020-02-15 MED ORDER — SODIUM CHLORIDE 0.9 % IV SOLN: Freq: Once | INTRAVENOUS | Status: AC

## 2020-02-15 MED ORDER — PALONOSETRON HCL INJECTION 0.25 MG/5ML
0.2500 mg | Freq: Once | INTRAVENOUS | Status: AC
  Administered 2020-02-15: 0.25 mg via INTRAVENOUS
  Filled 2020-02-15: qty 5

## 2020-02-15 NOTE — Progress Notes (Signed)
Blood glucose 300 today.    Patient given information for chemotherapy for review and to carry home.  All questions asked and answered.  Consent signed.    Patient tolerated chemotherapy with no complaints voiced.  Side effects with management reviewed with understanding verbalized.  Port site clean and dry with no bruising or swelling noted at site.  Good blood return noted before and after administration of chemotherapy.  Chemo pump connected with no alarms noted. Patient left ambulatory with VSS and no s/s of distress noted.

## 2020-02-15 NOTE — Assessment & Plan Note (Addendum)
1.  Metastatic pancreatic cancer to the liver, MSI stable: -8 cycles of gemcitabine and Abraxane from 05/17/2019 through 12/28/2019 with progression. -Last CA 19-9 was 3 on 11/23/2019. -CT CAP on 02/07/2020 showed subcentimeter new pulmonary nodules seen.  Pancreatic tail mass measures 5.5 x 3.7 cm compared to 3.2 x 1.9 cm on prior scan.  Hepatic steatosis with lobular contours.  Low-density lesion in the posterior right hemiliver 11 mm, previously 9 mm.  Some nodularity in the sigmoid mesocolon and along the right hepatic margin just below the gallbladder fossa. -We have recommended change in therapy.  We will use 5-FU and Onivyde regimen.  She had abdominal cramping with FOLFIRINOX. -We will start her on 20% dose reduction.  We will add atropine as premedication.  I have also sent a prescription for Lomotil to be taken as needed.  We discussed the side effects in detail.  We will see her back in 2 weeks for follow-up.  2.  Peripheral neuropathy: -Continue gabapentin 300 mg 3 times a day.  No worsening of numbness.  3.  Constipation: -Continue Colace 200 mg at bedtime and take lactulose as needed.  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  Current scan did not show any occlusion.  She does not report any abdominal pain.  5.  Hypertension: -She is taking Norvasc 5 mg daily.  Blood pressure is 150/77.

## 2020-02-15 NOTE — Patient Instructions (Signed)
[facility] at [facility] Discharge Instructions  Labs drawn from portacath today   Thank you for choosing [facility] at [facility] to provide your oncology and hematology care.  To afford each patient quality time with our provider, please arrive at least 15 minutes before your scheduled appointment time.   If you have a lab appointment with the Cancer Center please come in thru the Main Entrance and check in at the main information desk.  You need to re-schedule your appointment should you arrive 10 or more minutes late.  We strive to give you quality time with our providers, and arriving late affects you and other patients whose appointments are after yours.  Also, if you no show three or more times for appointments you may be dismissed from the clinic at the providers discretion.     Again, thank you for choosing  Cancer Center.  Our hope is that these requests will decrease the amount of time that you wait before being seen by our physicians.       _____________________________________________________________  Should you have questions after your visit to  Cancer Center, please contact our office at [phone] between the hours of 8:00 a.m. and 4:30 p.m.  Voicemails left after 4:00 p.m. will not be returned until the following business day.  For prescription refill requests, have your pharmacy contact our office and allow 72 hours.    Due to Covid, you will need to wear a mask upon entering the hospital. If you do not have a mask, a mask will be given to you at the Main Entrance upon arrival. For doctor visits, patients may have 1 support person with them. For treatment visits, patients can not have anyone with them due to social distancing guidelines and our immunocompromised population.     

## 2020-02-15 NOTE — Progress Notes (Signed)
Patient has been assessed, vital signs and labs have been reviewed by Dr. Delton Coombes. ANC, Creatinine, LFTs, and Platelets are within treatment parameters per Dr. Delton Coombes. The patient is good to proceed with treatment at this time. Treatment today will be 20% reduced per Dr. Delton Coombes.

## 2020-02-15 NOTE — Patient Instructions (Signed)
Liposomal Irinotecan (Onivyde)  About This Drug Liposomal irinotecan is used to treat cancer. It is given in the vein (IV). It will take 1.5 hours to infuse.   Possible Side Effects . Bone marrow suppression. This is a decrease in the number of white blood cells, red blood cells, and platelets. This may raise your risk of infection, make you tired and weak (fatigue), and raise your risk of bleeding.  . Tiredness and weakness  . Fever  . Soreness of the mouth and throat. You may have red areas, white patches, or sores that hurt.  . Nausea and vomiting (throwing up)  . Diarrhea (loose bowel movements)  . Decreased appetite (decreased hunger)  Note: Each of the side effects above was reported in 20% or greater of patients treated with liposomal irinotecan. Not all possible side effects are included above.  Warnings and Precautions . Severe diarrhea, which can be life-threatening  . Fever and severe decrease in white blood cells which puts you at an increased risk of infections which can be life-threatening  . Allergic reactions, including anaphylaxis are rare but may happen in some patients. Signs of allergic reaction to this drug may be swelling of the face, feeling like your tongue or throat are swelling, trouble breathing, rash, itching, fever, chills, feeling dizzy, and/or feeling that your heart is beating in a fast or not normal way. If this happens, do not take another dose of this drug. You should get urgent medical treatment.  . Inflammation (swelling) of the lungs, which can be life-threatening. You may have a dry cough or trouble breathing.  Note: Some of the side effects above are very rare. If you have concerns and/or questions, please discuss them with your medical team.  Important Information . This drug may be present in the saliva, tears, sweat, urine, stool, vomit, semen, and vaginal secretions. Talk to your doctor and/or your nurse about the necessary precautions to  take during this time.  Treating Side Effects . Manage tiredness by pacing your activities for the day.  . Be sure to include periods of rest between energy-draining activities.  . To decrease the risk of infection, wash your hands regularly.  . Avoid close contact with people who have a cold, the flu, or other infections.  . Take your temperature as your doctor or nurse tells you, and whenever you feel like you may have a fever.  . To help decrease the risk of bleeding, use a soft toothbrush. Check with your nurse before using dental floss.  . Be very careful when using knives or tools.  . Use an electric shaver instead of a razor.  . Mouth care is very important. Your mouth care should consist of routine, gentle cleaning of your teeth or dentures and rinsing your mouth with a mixture of 1/2 teaspoon of salt in 8 ounces of water or 1/2 teaspoon of baking soda in 8 ounces of water. This should be done at least after each meal and at bedtime.  . If you have mouth sores, avoid mouthwash that has alcohol. Also avoid alcohol and smoking because they can bother your mouth and throat.  . Drink plenty of fluids (a minimum of eight glasses per day is recommended).  . If you throw up or have loose bowel movements, you should drink more fluids so that you do not become dehydrated (lack of water in the body from losing too much fluid).  . If you have diarrhea, eat low-fiber foods that are high  in protein and calories and avoid foods that can irritate your digestive tracts or lead to cramping.  . Ask your nurse or doctor about medicine that can lessen or stop your diarrhea.  . To help with nausea and vomiting, eat small, frequent meals instead of three large meals a day. Choose foods and drinks that are at room temperature. Ask your nurse or doctor about other helpful tips and medicine that is available to help stop or lessen these symptoms.  . To help with decreased appetite, eat small,  frequent meals. Eat foods high in calories and protein, such as meat, poultry, fish, dry beans, tofu, eggs, nuts, milk, yogurt, cheese, ice cream, pudding, and nutritional supplements.  . Consider using sauces and spices to increase taste. Daily exercise, with your doctor's approval, may increase your appetite.  Marland Kitchen Keeping your pain under control is important to your well-being. Please tell your doctor or nurse if you are experiencing pain.  Food and Drug Interactions . This drug may interact with grapefruit and grapefruit juice. Talk to your doctor as this could make side effects worse.  . Check with your doctor or pharmacist about all other prescription medicines and over-the-counter medicines and dietary supplements (vitamins, minerals, herbs and others) you are taking before starting this medicine as there are known drug interactions with liposomal irinotecan. Also, check with your doctor or pharmacist before starting any new prescription or over-the-counter medicines, or dietary supplements to make sure that there are no interactions.  . Avoid the use of St. John's Wort while taking liposomal irinotecan as this may lower the levels of the drug in your body, which can make it less effective.  When to Call the Doctor Call your doctor or nurse if you have any of these symptoms and/or any new or unusual symptoms:  . Fever of 100.4 F (38 C) or higher  . Chills  . Tiredness and weakness that interferes with your daily activities  . Pain in your chest  . Dry cough  . Trouble breathing  . Feeling dizzy or lightheaded  . Easy bleeding or bruising  . Pain in your mouth or throat that makes it hard to eat or drink  . Nausea that stops you from eating or drinking and/or is not relieved by prescribed medicines  . Throwing up  . Diarrhea, 4 times in one day or diarrhea with lack of strength or a feeling of being dizzy  . Lasting loss of appetite or rapid weight loss of five pounds in  a week  . Signs of allergic reaction: swelling of the face, feeling like your tongue or throat are swelling, trouble breathing, rash, itching, fever, chills, feeling dizzy, and/or feeling that your heart is beating in a fast or not normal way. If this happens, call 911 for emergency care.  . If you think you may be pregnant or may have impregnated your partner  Reproduction Warnings . Pregnancy warning: This drug can have harmful effects on the unborn baby. Women of childbearing potential should use effective methods of birth control during your cancer treatment and for 1 month after treatment. Men with female partners of childbearing potential should use effective methods of birth control during your cancer treatment and for 4 months after your cancer treatment. Let your doctor know right away if you think you may be pregnant or may have impregnated your partner.  . Breastfeeding warning: Women should not breastfeed during treatment and for 1 month after treatment because this drug could enter  the breast milk and cause harm to a breastfeeding baby.  . Fertility warning: Human fertility studies have not been done with this drug. Talk with your doctor or nurse if you plan to have children. Ask for information on sperm or egg banking.  Leucovorin Calcium  About This Drug  Leucovorin is a vitamin. It is used in combination with other cancer fighting drugs such as 5-fluorouracil and methotrexate. Leucovorin is given in the vein (IV).  This drug runs at the same time as the oxaliplatin and takes 2 hours to infuse.   Possible Side Effects . Rash and itching  Note: Leucovorin by itself has very few side effects. Other side effects you may have can be caused by the other drugs you are taking, such as 5-fluorouracil.   Warnings and Precautions  . Allergic reactions, including anaphylaxis are rare but may happen in some patients. Signs of allergic reaction to this drug may be swelling of the face,  feeling like your tongue or throat are swelling, trouble breathing, rash, itching, fever, chills, feeling dizzy, and/or feeling that your heart is beating in a fast or not normal way. If this happens, do not take another dose of this drug. You should get urgent medical treatment.  Food and Drug Interactions  . There are no known interactions of leucovorin with food.  . This drug may interact with other medicines. Tell your doctor and pharmacist about all the prescription and over-the-counter medicines and dietary supplements (vitamins, minerals, herbs and others) that you are taking at this time.  . Also, check with your doctor or pharmacist before starting any new prescription or over-the-counter medicines, or dietary supplements to make sure that there are no interactions.   When to Call the Doctor  Call your doctor or nurse if you have any of these symptoms and/or any new or unusual symptoms:  . A new rash or a rash that is not relieved by prescribed medicines  . Signs of allergic reaction: swelling of the face, feeling like your tongue or throat are swelling, trouble breathing, rash, itching, fever, chills, feeling dizzy, and/or feeling that your heart is beating in a fast or not normal way. If this happens, call 911 for emergency care.  . If you think you may be pregnant   Reproduction Warnings  . Pregnancy warning: It is not known if this drug may harm an unborn child. For this reason, be sure to talk with your doctor if you are pregnant or planning to become pregnant while receiving this drug. Let your doctor know right away if you think you may be pregnant  . Breastfeeding warning: It is not known if this drug passes into breast milk. For this reason, women should talk to their doctor about the risks and benefits of breastfeeding during treatment with this drug because this drug may enter the breast milk and cause harm to a breastfeeding baby.  . Fertility warning: Human fertility  studies have not been done with this drug. Talk with your doctor or nurse if you plan to have children. Ask for information on sperm or egg banking.   5-Fluorouracil (Adrucil; 5FU)  About This Drug  Fluorouracil is used to treat cancer. It is given in the vein (IV). It is given as an IV push from a syringe and also as a continuous infusion given via an ambulatory pump (a pump you take home and wear for a specified amount of time).  Possible Side Effects  . Bone marrow suppression. This  is a decrease in the number of white blood cells, red blood cells, and platelets. This may raise your risk of infection, make you tired and weak (fatigue), and raise your risk of bleeding  . Changes in the tissue of the heart and/or heart attack. Some changes may happen that can cause your heart to have less ability to pump blood.  . Blurred vision or other changes in eyesight  . Nausea and throwing up (vomiting)  . Diarrhea (loose bowel movements)  . Ulcers - sores that may cause pain or bleeding in your digestive tract, which includes your mouth, esophagus, stomach, small/large intestines and rectum  . Soreness of the mouth and throat. You may have red areas, white patches, or sores that hurt.  . Allergic reactions, including anaphylaxis are rare but may happen in some patients. Signs of allergic reaction to this drug may be swelling of the face, feeling like your tongue or throat are swelling, trouble breathing, rash, itching, fever, chills, feeling dizzy, and/or feeling that your heart is beating in a fast or not normal way. If this happens, do not take another dose of this drug. You should get urgent medical treatment.  . Sensitivity to light (photosensitivity). Photosensitivity means that you may become more sensitive to the sun and/or light. You may get a skin rash/reaction if you are in the sun or are exposed to sun lamps and tanning beds. Your eyes may water more, mostly in bright light.  .  Changes in your nail color, nail loss and/or brittle nail  . Darkening of the skin, or changes to the color of your skin and/or veins used for infusion  . Rash, dry skin, or itching  Note: Not all possible side effects are included above.  Warnings and Precautions  . Hand-and-foot syndrome. The palms of your hands or soles of your feet may tingle, become numb, painful, swollen, or red.  . Changes in your central nervous system can happen. The central nervous system is made up of your brain and spinal cord. You could feel extreme tiredness, agitation, confusion, hallucinations (see or hear things that are not there), trouble understanding or speaking, loss of control of your bowels or bladder, eyesight changes, numbness or lack of strength to your arms, legs, face, or body, or coma. If you start to have any of these symptoms let your doctor know right away.  . Side effects of this drug may be unexpectedly severe in some patients  Note: Some of the side effects above are very rare. If you have concerns and/or questions, please discuss them with your medical team.   Important Information  . This drug may be present in the saliva, tears, sweat, urine, stool, vomit, semen, and vaginal secretions. Talk to your doctor and/or your nurse about the necessary precautions to take during this time.   Treating Side Effects  . Manage tiredness by pacing your activities for the day.  . Be sure to include periods of rest between energy-draining activities.  . To help decrease the risk of infections, wash your hands regularly.  . Avoid close contact with people who have a cold, the flu, or other infections.  . Take your temperature as your doctor or nurse tells you, and whenever you feel like you may have a fever.  . Use a soft toothbrush. Check with your nurse before using dental floss.  . Be very careful when using knives or tools.  . Use an electric shaver instead of a razor.  Marland Kitchen  If you  have a nose bleed, sit with your head tipped slightly forward. Apply pressure by lightly pinching the bridge of your nose between your thumb and forefinger. Call your doctor if you feel dizzy or faint or if the bleeding doesn't stop after 10 to 15 minutes.  . Drink plenty of fluids (a minimum of eight glasses per day is recommended).  . If you throw up or have loose bowel movements, you should drink more fluids so that you do not become dehydrated (lack of water in the body from losing too much fluid).  . To help with nausea and vomiting, eat small, frequent meals instead of three large meals a day. Choose foods and drinks that are at room temperature. Ask your nurse or doctor about other helpful tips and medicine that is available to help, stop, or lessen these symptoms.  . If you have diarrhea, eat low-fiber foods that are high in protein and calories and avoid foods that can irritate your digestive tracts or lead to cramping.  . Ask your nurse or doctor about medicine that can lessen or stop your diarrhea.  . Mouth care is very important. Your mouth care should consist of routine, gentle cleaning of your teeth or dentures and rinsing your mouth with a mixture of 1/2 teaspoon of salt in 8 ounces of water or 1/2 teaspoon of baking soda in 8 ounces of water. This should be done at least after each meal and at bedtime.  . If you have mouth sores, avoid mouthwash that has alcohol. Also avoid alcohol and smoking because they can bother your mouth and throat.  Marland Kitchen Keeping your nails moisturized may help with brittleness.  . To help with itching, moisturize your skin several times day.  . Use sunscreen with SPF 30 or higher when you are outdoors even for a short time. Cover up when you are out in the sun. Wear wide-brimmed hats, long-sleeved shirts, and pants. Keep your neck, chest, and back covered. Wear dark sun glasses when in the sun or bright lights.  . If you get a rash do not put anything on it  unless your doctor or nurse says you may. Keep the area around the rash clean and dry. Ask your doctor for medicine if your rash bothers you.  Marland Kitchen Keeping your pain under control is important to your well-being. Please tell your doctor or nurse if you are experiencing pain.   Food and Drug Interactions  . There are no known interactions of fluorouracil with food.  . Check with your doctor or pharmacist about all other prescription medicines and over-the-counter medicines and dietary supplements (vitamins, minerals, herbs and others) you are taking before starting this medicine as there are known drug interactions with 5-fluoroucacil. Also, check with your doctor or pharmacist before starting any new prescription or over-the-counter medicines, or dietary supplements to make sure that there are no interactions.  When to Call the Doctor  Call your doctor or nurse if you have any of these symptoms and/or any new or unusual symptoms:  . Fever of 100.4 F (38 C) or higher  . Chills  . Easy bleeding or bruising  . Nose bleed that doesn't stop bleeding after 10-15 minutes  . Trouble breathing  . Feeling dizzy or lightheaded  . Feeling that your heart is beating in a fast or not normal way (palpitations)  . Chest pain or symptoms of a heart attack. Most heart attacks involve pain in the center of the chest  that lasts more than a few minutes. The pain may go away and come back or it can be constant. It can feel like pressure, squeezing, fullness, or pain. Sometimes pain is felt in one or both arms, the back, neck, jaw, or stomach. If any of these symptoms last 2 minutes, call 911.  Marland Kitchen Confusion and/or agitation  . Hallucinations  . Trouble understanding or speaking  . Loss of control of bowels or bladder  . Blurry vision or changes in your eyesight  . Headache that does not go away  . Numbness or lack of strength to your arms, legs, face, or body  . Nausea that stops you from eating  or drinking and/or is not relieved by prescribed medicines  . Throwing up  . Diarrhea, 4 times in one day or diarrhea with lack of strength or a feeling of being dizzy  . Pain in your mouth or throat that makes it hard to eat or drink  . Pain along the digestive tract - especially if worse after eating  . Blood in your vomit (bright red or coffee-ground) and/or stools (bright red, or black/tarry)  . Coughing up blood  . Tiredness that interferes with your daily activities  . Painful, red, or swollen areas on your hands or feet or around your nails  . A new rash or a rash that is not relieved by prescribed medicines  . Develop sensitivity to sunlight/light  . Numbness and/or tingling of your hands and/or feet  . Signs of allergic reaction: swelling of the face, feeling like your tongue or throat are swelling, trouble breathing, rash, itching, fever, chills, feeling dizzy, and/or feeling that your heart is beating in a fast or not normal way. If this happens, call 911 for emergency care.  . If you think you are pregnant or may have impregnated your partner  Reproduction Warnings  . Pregnancy warning: This drug may have harmful effects on the unborn baby. Women of child bearing potential should use effective methods of birth control during your cancer treatment and 3 months after treatment. Men with female partners of childbearing potential should use effective methods of birth control during your cancer treatment and for 3 months after your cancer treatment. Let your doctor know right away if you think you may be pregnant or may have impregnated your partner.  . Breastfeeding warning: It is not known if this drug passes into breast milk. For this reason, Women should not breastfeed during treatment because this drug could enter the breast milk and cause harm to a breastfeeding baby.  . Fertility warning: In men and women both, this drug may affect your ability to have children in the  future. Talk with your doctor or nurse if you plan to have children. Ask for information on sperm or egg banking.   SELF CARE ACTIVITIES WHILE RECEIVING CHEMOTHERAPY:  Hydration Increase your fluid intake 48 hours prior to treatment and drink at least 8 to 12 cups (64 ounces) of water/decaffeinated beverages per day after treatment. You can still have your cup of coffee or soda but these beverages do not count as part of your 8 to 12 cups that you need to drink daily. No alcohol intake.  Medications Continue taking your normal prescription medication as prescribed.  If you start any new herbal or new supplements please let us know first to make sure it is safe.  Mouth Care Have teeth cleaned professionally before starting treatment. Keep dentures and partial plates clean. Use soft  toothbrush and do not use mouthwashes that contain alcohol. Biotene is a good mouthwash that is available at most pharmacies or may be ordered by calling [phone]. Use warm salt water gargles (1 teaspoon salt per 1 quart warm water) before and after meals and at bedtime. If you need dental work, please let the doctor know before you go for your appointment so that we can coordinate the best possible time for you in regards to your chemo regimen. You need to also let your dentist know that you are actively taking chemo. We may need to do labs prior to your dental appointment.  Skin Care Always use sunscreen that has not expired and with SPF (Sun Protection Factor) of 50 or higher. Wear hats to protect your head from the sun. Remember to use sunscreen on your hands, ears, face, & feet.  Use good moisturizing lotions such as udder cream, eucerin, or even Vaseline. Some chemotherapies can cause dry skin, color changes in your skin and nails.    . Avoid long, hot showers or baths. . Use gentle, fragrance-free soaps and laundry detergent. . Use moisturizers, preferably creams or ointments rather than lotions because the  thicker consistency is better at preventing skin dehydration. Apply the cream or ointment within 15 minutes of showering. Reapply moisturizer at night, and moisturize your hands every time after you wash them.  Hair Loss (if your doctor says your hair will fall out)  . If your doctor says that your hair is likely to fall out, decide before you begin chemo whether you want to wear a wig. You may want to shop before treatment to match your hair color. . Hats, turbans, and scarves can also camouflage hair loss, although some people prefer to leave their heads uncovered. If you go bare-headed outdoors, be sure to use sunscreen on your scalp. . Cut your hair short. It eases the inconvenience of shedding lots of hair, but it also can reduce the emotional impact of watching your hair fall out. . Don't perm or color your hair during chemotherapy. Those chemical treatments are already damaging to hair and can enhance hair loss. Once your chemo treatments are done and your hair has grown back, it's OK to resume dyeing or perming hair.  With chemotherapy, hair loss is almost always temporary. But when it grows back, it may be a different color or texture. In older adults who still had hair color before chemotherapy, the new growth may be completely gray.  Often, new hair is very fine and soft.  Infection Prevention Please wash your hands for at least 30 seconds using warm soapy water. Handwashing is the #1 way to prevent the spread of germs. Stay away from sick people or people who are getting over a cold. If you develop respiratory systems such as green/yellow mucus production or productive cough or persistent cough let us know and we will see if you need an antibiotic. It is a good idea to keep a pair of gloves on when going into grocery stores/[employer] to decrease your risk of coming into contact with germs on the carts, etc. Carry alcohol hand gel with you at all times and use it frequently if out in public. If  your temperature reaches 100.4 or higher please call the clinic and let us know.  If it is after hours or on the weekend please go to the ER if your temperature is over 100.4.  Please have your own personal thermometer at home to use.  Sex and bodily fluids If you are going to have sex, a condom must be used to protect the person that isn't taking chemotherapy. Chemo can decrease your libido (sex drive). For a few days after chemotherapy, chemotherapy can be excreted through your bodily fluids.  When using the toilet please close the lid and flush the toilet twice.  Do this for a few day after you have had chemotherapy.   Effects of chemotherapy on your sex life Some changes are simple and won't last long. They won't affect your sex life permanently.  Sometimes you may feel: . too tired . not strong enough to be very active . sick or sore  . not in the mood . anxious or low Your anxiety might not seem related to sex. For example, you may be worried about the cancer and how your treatment is going. Or you may be worried about money, or about how you family are coping with your illness.  These things can cause stress, which can affect your interest in sex. It's important to talk to your partner about how you feel.  Remember - the changes to your sex life don't usually last long. There's usually no medical reason to stop having sex during chemo. The drugs won't have any long term physical effects on your performance or enjoyment of sex. Cancer can't be passed on to your partner during sex  Contraception It's important to use reliable contraception during treatment. Avoid getting pregnant while you or your partner are having chemotherapy. This is because the drugs may harm the baby. Sometimes chemotherapy drugs can leave a man or woman infertile.  This means you would not be able to have children in the future. You might want to talk to someone about permanent infertility. It can be very difficult to  learn that you may no longer be able to have children. Some people find counselling helpful. There might be ways to preserve your fertility, although this is easier for men than for women. You may want to speak to a fertility expert. You can talk about sperm banking or harvesting your eggs. You can also ask about other fertility options, such as donor eggs. If you have or have had breast cancer, your doctor might advise you not to take the contraceptive pill. This is because the hormones in it might affect the cancer. It is not known for sure whether or not chemotherapy drugs can be passed on through semen or secretions from the vagina. Because of this some doctors advise people to use a barrier method if you have sex during treatment. This applies to vaginal, anal or oral sex. Generally, doctors advise a barrier method only for the time you are actually having the treatment and for about a week after your treatment. Advice like this can be worrying, but this does not mean that you have to avoid being intimate with your partner. You can still have close contact with your partner and continue to enjoy sex.  Animals If you have cats or birds we just ask that you not change the litter or change the cage.  Please have someone else do this for you while you are on chemotherapy.   Food Safety During and After Cancer Treatment Food safety is important for people both during and after cancer treatment. Cancer and cancer treatments, such as chemotherapy, radiation therapy, and stem cell/bone marrow transplantation, often weaken the immune system. This makes it harder for your body to protect itself from foodborne illness, also  called food poisoning. Foodborne illness is caused by eating food that contains harmful bacteria, parasites, or viruses.  Foods to avoid Some foods have a higher risk of becoming tainted with bacteria. These include: Marland Kitchen Unwashed fresh fruit and vegetables, especially leafy vegetables that can  hide dirt and other contaminants . Raw sprouts, such as alfalfa sprouts . Raw or undercooked beef, especially ground beef, or other raw or undercooked meat and poultry . Fatty, fried, or spicy foods immediately before or after treatment.  These can sit heavy on your stomach and make you feel nauseous. . Raw or undercooked shellfish, such as oysters. . Sushi and sashimi, which often contain raw fish.  . Unpasteurized beverages, such as unpasteurized fruit juices, raw milk, raw yogurt, or cider . Undercooked eggs, such as soft boiled, over easy, and poached; raw, unpasteurized eggs; or foods made with raw egg, such as homemade raw cookie dough and homemade mayonnaise  Simple steps for food safety  Shop smart. . Do not buy food stored or displayed in an unclean area. . Do not buy bruised or damaged fruits or vegetables. . Do not buy cans that have cracks, dents, or bulges. . Pick up foods that can spoil at the end of your shopping trip and store them in a cooler on the way home.  Prepare and clean up foods carefully. . Rinse all fresh fruits and vegetables under running water, and dry them with a clean towel or paper towel. . Clean the top of cans before opening them. . After preparing food, wash your hands for 20 seconds with hot water and soap. Pay special attention to areas between fingers and under nails. . Clean your utensils and dishes with hot water and soap. Marland Kitchen Disinfect your kitchen and cutting boards using 1 teaspoon of liquid, unscented bleach mixed into 1 quart of water.    Dispose of old food. . Eat canned and packaged food before its expiration date (the "use by" or "best before" date). . Consume refrigerated leftovers within 3 to 4 days. After that time, throw out the food. Even if the food does not smell or look spoiled, it still may be unsafe. Some bacteria, such as Listeria, can grow even on foods stored in the refrigerator if they are kept for too long.  Take precautions  when eating out. . At restaurants, avoid buffets and salad bars where food sits out for a long time and comes in contact with many people. Food can become contaminated when someone with a virus, often a norovirus, or another "bug" handles it. . Put any leftover food in a "to-go" container yourself, rather than having the server do it. And, refrigerate leftovers as soon as you get home. . Choose restaurants that are clean and that are willing to prepare your food as you order it cooked.   AT HOME MEDICATIONS:  Compazine/Prochlorperazine 10mg  tablet. Take 1 tablet every 6 hours as needed for nausea/vomiting. (This can make you sleepy)   EMLA cream. Apply a quarter size amount to port site 1 hour prior to chemo. Do not rub in. Cover with plastic wrap.    Diarrhea Sheet   If you are having loose stools/diarrhea, please purchase Imodium and begin taking as outlined:  At the first sign of poorly formed or loose stools you should begin taking Imodium (loperamide) 2 mg capsules.  Take two tablets (4mg ) followed by one tablet (2mg ) every 2 hours - DO NOT EXCEED 8 tablets in 24 hours.  If it is bedtime and you are having loose stools, take 2 tablets at bedtime, then 2 tablets every 4 hours until morning.   Always call the [facility] if you are having loose stools/diarrhea that you can't get under control.  Loose stools/diarrhea leads to dehydration (loss of water) in your body.  We have other options of trying to get the loose stools/diarrhea to stop but you must let us know!   Constipation Sheet  Colace - 100 mg capsules - take 2 capsules daily.  If this doesn't help then you can increase to 2 capsules twice daily.  Please call if the above does not work for you. Do not go more than 2 days without a bowel movement.  It is very important that  you do not become constipated.  It will make you feel sick to your stomach (nausea) and can cause abdominal pain and vomiting.  Nausea Sheet   Compazine/Prochlorperazine 10mg  tablet. Take 1 tablet every 6 hours as needed for nausea/vomiting (This can make you drowsy).  If you are having persistent nausea (nausea that does not stop) please call the [facility] and let us know the amount of nausea that you are experiencing.  If you begin to vomit, you need to call the [facility] and if it is the weekend and you have vomited more than one time and can't get it to stop-go to the Emergency Room.  Persistent nausea/vomiting can lead to dehydration (loss of fluid in your body) and will make you feel very weak and unwell. Ice chips, sips of clear liquids, foods that are at room temperature, crackers, and toast tend to be better tolerated.   SYMPTOMS TO REPORT AS SOON AS POSSIBLE AFTER TREATMENT:  FEVER GREATER THAN 100.4 F  CHILLS WITH OR WITHOUT FEVER  NAUSEA AND VOMITING THAT IS NOT CONTROLLED WITH YOUR NAUSEA EDICATION  UNUSUAL SHORTNESS OF BREATH  UNUSUAL BRUISING OR BLEEDING  TENDERNESS IN MOUTH AND THROAT WITH OR WITHOUT PRESENCE OF ULCERS  URINARY PROBLEMS  BOWEL PROBLEMS  UNUSUAL RASH     Wear comfortable clothing and clothing appropriate for easy access to any Portacath or PICC line. Let us know if there is anything that we can do to make your therapy better!    What to do if you need assistance after hours or on the weekends: CALL [phone].  HOLD on the line, do not hang up.  You will hear multiple messages but at the end you will be connected with a nurse triage line.  They will contact the doctor if necessary.  Most of the time they will be able to assist you.  Do not call the hospital operator.      I have been informed and understand all of the instructions given to me and have received a copy. I have been instructed to call the clinic [phone]  or my  family physician as soon as possible for continued medical care, if indicated. I do not have any more questions at this time but understand that I may call the [facility] or the Patient Navigator at [phone] during office hours should I have questions or need assistance in obtaining follow-up care.

## 2020-02-15 NOTE — Patient Instructions (Signed)
[facility] at [facility] Discharge Instructions  You were seen today by Dr. Katragadda. He went over your recent results. He will see you back in for labs and follow up.   Thank you for choosing [facility] at [facility] to provide your oncology and hematology care.  To afford each patient quality time with our provider, please arrive at least 15 minutes before your scheduled appointment time.   If you have a lab appointment with the Cancer Center please come in thru the  Main Entrance and check in at the main information desk  You need to re-schedule your appointment should you arrive 10 or more minutes late.  We strive to give you quality time with our providers, and arriving late affects you and other patients whose appointments are after yours.  Also, if you no show three or more times for appointments you may be dismissed from the clinic at the providers discretion.     Again, thank you for choosing [facility].  Our hope is that these requests will decrease the amount of time that you wait before being seen by our physicians.       _____________________________________________________________  Should you have questions after your visit to [facility], please contact our office at [phone] between the hours of 8:00 a.m. and 4:30 p.m.  Voicemails left after 4:00 p.m. will not be returned until the following business day.  For prescription refill requests, have your pharmacy contact our office and allow 72 hours.    Cancer Center Support Programs:   > Cancer Support Group  2nd Tuesday of the month 1pm-2pm, Journey Room    

## 2020-02-17 ENCOUNTER — Inpatient Hospital Stay (HOSPITAL_COMMUNITY): Payer: Medicare HMO

## 2020-02-17 ENCOUNTER — Encounter (HOSPITAL_COMMUNITY): Payer: Self-pay

## 2020-02-17 ENCOUNTER — Other Ambulatory Visit: Payer: Self-pay

## 2020-02-17 VITALS — BP 133/65 | HR 102 | Temp 97.1°F | Resp 20

## 2020-02-17 DIAGNOSIS — Z95828 Presence of other vascular implants and grafts: Secondary | ICD-10-CM

## 2020-02-17 DIAGNOSIS — C787 Secondary malignant neoplasm of liver and intrahepatic bile duct: Secondary | ICD-10-CM

## 2020-02-17 DIAGNOSIS — Z5111 Encounter for antineoplastic chemotherapy: Secondary | ICD-10-CM | POA: Diagnosis not present

## 2020-02-17 MED ORDER — SODIUM CHLORIDE 0.9% FLUSH
10.0000 mL | INTRAVENOUS | Status: DC | PRN
  Administered 2020-02-17: 10 mL

## 2020-02-17 MED ORDER — HEPARIN SOD (PORK) LOCK FLUSH 100 UNIT/ML IV SOLN
500.0000 [IU] | Freq: Once | INTRAVENOUS | Status: AC | PRN
  Administered 2020-02-17: 500 [IU]

## 2020-02-17 NOTE — Patient Instructions (Signed)
[facility] at [facility]  Discharge Instructions:   _______________________________________________________________  Thank you for choosing [facility] at [facility] to provide your oncology and hematology care.  To afford each patient quality time with our providers, please arrive at least 15 minutes before your scheduled appointment.  You need to re-schedule your appointment if you arrive 10 or more minutes late.  We strive to give you quality time with our providers, and arriving late affects you and other patients whose appointments are after yours.  Also, if you no show three or more times for appointments you may be dismissed from the clinic.  Again, thank you for choosing  [facility] Center at [facility]. Our hope is that these requests will allow you access to exceptional care and in a timely manner. _______________________________________________________________  If you have questions after your visit, please contact our office at [phone] between the hours of 8:30 a.m. and 5:00 p.m. Voicemails left after 4:30 p.m. will not be returned until the following business day. _______________________________________________________________  For prescription refill requests, have your pharmacy contact our office. _______________________________________________________________  Recommendations made by the consultant and any test results will be sent to your referring physician. _______________________________________________________________ 

## 2020-02-17 NOTE — Progress Notes (Signed)
[name] presented for pump d/c and port flush.  Clean, Dry and Intact Good blood return present. Portacath flushed with 34ml NS and 500U/65ml Heparin per protocol and needle removed intact. Procedure without incident. Patient tolerated procedure well. Treatment given today per MD orders. Vital signs stable. No complaints at this time. Discharged from clinic ambulatory. F/U with [facility] as scheduled.

## 2020-02-20 ENCOUNTER — Telehealth (HOSPITAL_COMMUNITY): Payer: Self-pay

## 2020-02-20 NOTE — Telephone Encounter (Signed)
24 hr Chemo F/U call : Pt reports that her energy level is still low and her appetite is not very good but she is drinking plenty of fluids. Pt denies any N+V,fever, chills or diarrhea at this time. Pt encouraged to take frequent rest periods and to call for any questions or issues. Pt verbalized understanding.

## 2020-02-23 ENCOUNTER — Ambulatory Visit (HOSPITAL_COMMUNITY): Payer: Medicare HMO | Admitting: Hematology

## 2020-02-23 ENCOUNTER — Other Ambulatory Visit (HOSPITAL_COMMUNITY): Payer: Medicare HMO

## 2020-02-23 ENCOUNTER — Ambulatory Visit (HOSPITAL_COMMUNITY): Payer: Medicare HMO

## 2020-02-29 ENCOUNTER — Inpatient Hospital Stay (HOSPITAL_COMMUNITY): Payer: Medicare HMO | Attending: Hematology

## 2020-02-29 ENCOUNTER — Other Ambulatory Visit: Payer: Self-pay

## 2020-02-29 ENCOUNTER — Inpatient Hospital Stay (HOSPITAL_COMMUNITY): Payer: Medicare HMO

## 2020-02-29 ENCOUNTER — Inpatient Hospital Stay (HOSPITAL_BASED_OUTPATIENT_CLINIC_OR_DEPARTMENT_OTHER): Payer: Medicare HMO | Admitting: Hematology

## 2020-02-29 VITALS — BP 134/58 | HR 67 | Temp 97.7°F | Resp 18

## 2020-02-29 DIAGNOSIS — C252 Malignant neoplasm of tail of pancreas: Secondary | ICD-10-CM | POA: Insufficient documentation

## 2020-02-29 DIAGNOSIS — Z8042 Family history of malignant neoplasm of prostate: Secondary | ICD-10-CM | POA: Diagnosis not present

## 2020-02-29 DIAGNOSIS — E119 Type 2 diabetes mellitus without complications: Secondary | ICD-10-CM | POA: Insufficient documentation

## 2020-02-29 DIAGNOSIS — Z79899 Other long term (current) drug therapy: Secondary | ICD-10-CM | POA: Diagnosis not present

## 2020-02-29 DIAGNOSIS — K5900 Constipation, unspecified: Secondary | ICD-10-CM | POA: Insufficient documentation

## 2020-02-29 DIAGNOSIS — C787 Secondary malignant neoplasm of liver and intrahepatic bile duct: Secondary | ICD-10-CM | POA: Insufficient documentation

## 2020-02-29 DIAGNOSIS — Z794 Long term (current) use of insulin: Secondary | ICD-10-CM | POA: Diagnosis not present

## 2020-02-29 DIAGNOSIS — C259 Malignant neoplasm of pancreas, unspecified: Secondary | ICD-10-CM

## 2020-02-29 DIAGNOSIS — F17210 Nicotine dependence, cigarettes, uncomplicated: Secondary | ICD-10-CM | POA: Diagnosis not present

## 2020-02-29 DIAGNOSIS — I10 Essential (primary) hypertension: Secondary | ICD-10-CM | POA: Diagnosis not present

## 2020-02-29 DIAGNOSIS — J439 Emphysema, unspecified: Secondary | ICD-10-CM | POA: Insufficient documentation

## 2020-02-29 DIAGNOSIS — G629 Polyneuropathy, unspecified: Secondary | ICD-10-CM | POA: Insufficient documentation

## 2020-02-29 DIAGNOSIS — C7800 Secondary malignant neoplasm of unspecified lung: Secondary | ICD-10-CM | POA: Diagnosis not present

## 2020-02-29 DIAGNOSIS — Z801 Family history of malignant neoplasm of trachea, bronchus and lung: Secondary | ICD-10-CM | POA: Insufficient documentation

## 2020-02-29 DIAGNOSIS — Z800 Family history of malignant neoplasm of digestive organs: Secondary | ICD-10-CM | POA: Diagnosis not present

## 2020-02-29 DIAGNOSIS — Z90710 Acquired absence of both cervix and uterus: Secondary | ICD-10-CM | POA: Diagnosis not present

## 2020-02-29 DIAGNOSIS — Z95828 Presence of other vascular implants and grafts: Secondary | ICD-10-CM

## 2020-02-29 DIAGNOSIS — Z5111 Encounter for antineoplastic chemotherapy: Secondary | ICD-10-CM | POA: Diagnosis present

## 2020-02-29 LAB — COMPREHENSIVE METABOLIC PANEL
ALT: 20 U/L (ref 0–44)
AST: 18 U/L (ref 15–41)
Albumin: 3.5 g/dL (ref 3.5–5.0)
Alkaline Phosphatase: 82 U/L (ref 38–126)
Anion gap: 11 (ref 5–15)
BUN: 12 mg/dL (ref 6–20)
CO2: 28 mmol/L (ref 22–32)
Calcium: 9.1 mg/dL (ref 8.9–10.3)
Chloride: 96 mmol/L — ABNORMAL LOW (ref 98–111)
Creatinine, Ser: 0.82 mg/dL (ref 0.44–1.00)
GFR calc Af Amer: >60 mL/min (ref >60)
GFR calc non Af Amer: >60 mL/min (ref >60)
Glucose, Bld: 284 mg/dL — ABNORMAL HIGH (ref 70–99)
Potassium: 3.8 mmol/L (ref 3.5–5.1)
Sodium: 135 mmol/L (ref 135–145)
Total Bilirubin: 0.4 mg/dL (ref 0.3–1.2)
Total Protein: 6.5 g/dL (ref 6.5–8.1)

## 2020-02-29 LAB — CBC WITH DIFFERENTIAL/PLATELET
Abs Immature Granulocytes: 0.02 10*3/uL (ref 0.00–0.07)
Basophils Absolute: 0 10*3/uL (ref 0.0–0.1)
Basophils Relative: 1 %
Eosinophils Absolute: 0.4 10*3/uL (ref 0.0–0.5)
Eosinophils Relative: 6 %
HCT: 41.5 % (ref 36.0–46.0)
Hemoglobin: 12.6 g/dL (ref 12.0–15.0)
Immature Granulocytes: 0 %
Lymphocytes Relative: 30 %
Lymphs Abs: 1.7 10*3/uL (ref 0.7–4.0)
MCH: 24.7 pg — ABNORMAL LOW (ref 26.0–34.0)
MCHC: 30.4 g/dL (ref 30.0–36.0)
MCV: 81.4 fL (ref 80.0–100.0)
Monocytes Absolute: 0.6 10*3/uL (ref 0.1–1.0)
Monocytes Relative: 10 %
Neutro Abs: 3 10*3/uL (ref 1.7–7.7)
Neutrophils Relative %: 53 %
Platelets: 194 10*3/uL (ref 150–400)
RBC: 5.1 MIL/uL (ref 3.87–5.11)
RDW: 16.8 % — ABNORMAL HIGH (ref 11.5–15.5)
WBC: 5.8 10*3/uL (ref 4.0–10.5)
nRBC: 0 % (ref 0.0–0.2)

## 2020-02-29 LAB — MAGNESIUM: Magnesium: 1.9 mg/dL (ref 1.7–2.4)

## 2020-02-29 MED ORDER — SODIUM CHLORIDE 0.9 % IV SOLN
280.0000 mg/m2 | Freq: Once | INTRAVENOUS | Status: AC
  Administered 2020-02-29: 656 mg via INTRAVENOUS
  Filled 2020-02-29: qty 32.8

## 2020-02-29 MED ORDER — ONDANSETRON HCL 4 MG PO TABS: ORAL_TABLET | ORAL | 3 refills | Status: AC

## 2020-02-29 MED ORDER — SODIUM CHLORIDE 0.9 % IV SOLN
49.0000 mg/m2 | Freq: Once | INTRAVENOUS | Status: AC
  Administered 2020-02-29: 116.1 mg via INTRAVENOUS
  Filled 2020-02-29: qty 27

## 2020-02-29 MED ORDER — SODIUM CHLORIDE 0.9 % IV SOLN
1680.0000 mg/m2 | INTRAVENOUS | Status: DC
  Administered 2020-02-29: 3950 mg via INTRAVENOUS
  Filled 2020-02-29: qty 79

## 2020-02-29 MED ORDER — PALONOSETRON HCL INJECTION 0.25 MG/5ML
0.2500 mg | Freq: Once | INTRAVENOUS | Status: AC
  Administered 2020-02-29: 0.25 mg via INTRAVENOUS
  Filled 2020-02-29: qty 5

## 2020-02-29 MED ORDER — ATROPINE SULFATE 1 MG/ML IJ SOLN
1.0000 mg | Freq: Once | INTRAMUSCULAR | Status: AC
  Administered 2020-02-29: 1 mg via INTRAVENOUS
  Filled 2020-02-29: qty 1

## 2020-02-29 MED ORDER — SODIUM CHLORIDE 0.9% FLUSH
10.0000 mL | INTRAVENOUS | Status: DC | PRN
  Administered 2020-02-29: 10 mL

## 2020-02-29 MED ORDER — SODIUM CHLORIDE 0.9 % IV SOLN
10.0000 mg | Freq: Once | INTRAVENOUS | Status: AC
  Administered 2020-02-29: 10 mg via INTRAVENOUS
  Filled 2020-02-29: qty 10

## 2020-02-29 MED ORDER — SODIUM CHLORIDE 0.9 % IV SOLN: Freq: Once | INTRAVENOUS | Status: AC

## 2020-02-29 NOTE — Progress Notes (Signed)
Patient tolerated chemotherapy with no complaints voiced.  Side effects with management reviewed with understanding verbalized.  Port site clean and dry with no bruising or swelling noted at site.  Good blood return noted before and after administration of chemotherapy.  Chemotherapy pump connected with no alarms noted.   Patient left ambulatory with VSS and no s/s of distress noted.  

## 2020-02-29 NOTE — Progress Notes (Signed)
Patient has been assessed, vital signs and labs have been reviewed by Dr. Delton Coombes. ANC, Creatinine, LFTs, and Platelets are within treatment parameters per Dr. Delton Coombes. The patient is good to proceed with treatment at this time. Dose reduction to 70% today for treatment per Dr. Delton Coombes.

## 2020-02-29 NOTE — Patient Instructions (Signed)
[facility] at [facility] Discharge Instructions  You were seen today by Dr. Delton Coombes. He went over your recent results. Dr. Delton Coombes advised you to keep your mouth moist with mouthwash, or mixing 1 quart with 1 teaspoon of salt and 1 teaspoon baking soda and rinsing your mouth, to prevent the mouth sores from getting worse. Dr. Delton Coombes will see you back in 2 weeks for labs and follow up.   Thank you for choosing [facility] at [facility] to provide your oncology and hematology care.  To afford each patient quality time with our provider, please arrive at least 15 minutes before your scheduled appointment time.   If you have a lab appointment with the [facility] please come in thru the  Main Entrance and check in at the main information desk  You need to re-schedule your appointment should you arrive 10 or more minutes late.  We strive to give you quality time with our providers, and arriving late affects you and other patients whose appointments are after yours.  Also, if you no show three or more times for appointments you may be dismissed from the clinic at the providers discretion.     Again, thank you for choosing [facility].  Our hope is that these requests will decrease the amount of time that you wait before being seen by our physicians.       _____________________________________________________________  Should you have questions after your visit to [facility], please contact our office at [phone] between the hours of 8:00 a.m. and 4:30 p.m.  Voicemails left after 4:00 p.m. will not be returned until the following business day.  For prescription refill requests, have your pharmacy contact our office and allow 72 hours.    Cancer Center Support Programs:   > Cancer Support Group  2nd Tuesday of the month 1pm-2pm, Journey Room

## 2020-02-29 NOTE — Progress Notes (Signed)
Doris [name], [place] [address]   CLINIC:  Medical Oncology/Hematology  PCP:  Merdis Delay, DO [address] / [address] [place] [address] [phone]   REASON FOR VISIT:  Follow-up for metastatic pancreatic cancer to the liver  CURRENT THERAPY: 5-FU and Onivyde  BRIEF ONCOLOGIC HISTORY:  Oncology History  Pancreatic cancer metastasized to liver [facility])  05/09/2019 Initial Diagnosis   Pancreatic cancer metastasized to liver ([place])   05/17/2019 - 02/08/2020 Chemotherapy   The patient had PACLitaxel-protein bound (ABRAXANE) chemo infusion 275 mg, 125 mg/m2 = 275 mg, Intravenous,  Once, 8 of 9 cycles Dose modification: 100 mg/m2 (80 % of original dose 125 mg/m2, Cycle 1, Reason: Other (see comments), Comment: neutropenia), 93.75 mg/m2 (75 % of original dose 125 mg/m2, Cycle 1, Reason: Other (see comments), Comment: neutropenia), 100 mg/m2 (80 % of original dose 125 mg/m2, Cycle 2, Reason: Other (see comments), Comment: cytopenias with previous treatment), 75 mg/m2 (original dose 125 mg/m2, Cycle 3, Reason: Provider Judgment, Comment: neuropathy stated as 10/10 dose reduce required per NP Reynolds Bowl) Administration: 275 mg (05/17/2019), 225 mg (05/24/2019), 200 mg (05/31/2019), 225 mg (06/14/2019), 225 mg (06/28/2019), 225 mg (07/12/2019), 175 mg (07/19/2019), 175 mg (08/16/2019), 175 mg (08/31/2019), 175 mg (09/27/2019), 175 mg (10/11/2019), 175 mg (10/25/2019), 175 mg (11/08/2019), 175 mg (11/23/2019), 175 mg (12/08/2019), 175 mg (12/28/2019), 175 mg (01/19/2020) ondansetron (ZOFRAN) 8 mg in sodium chloride 0.9 % 50 mL IVPB, 8 mg (100 % of original dose 8 mg), Intravenous,  Once, 1 of 1 cycle Dose modification: 8 mg (original dose 8 mg, Cycle 1) gemcitabine (GEMZAR) 2,200 mg in sodium chloride 0.9 % 250 mL chemo infusion, 2,204 mg, Intravenous,  Once, 8 of 9 cycles Dose modification: 750 mg/m2 (75 % of original dose 1,000 mg/m2, Cycle 1, Reason: Other (see comments), Comment:  neutropenia), 750 mg/m2 (75 % of original dose 1,000 mg/m2, Cycle 1, Reason: Other (see comments), Comment: neutropenia), 800 mg/m2 (80 % of original dose 1,000 mg/m2, Cycle 2, Reason: Other (see comments), Comment: cytopenia) Administration: 2,200 mg (05/17/2019), 1,600 mg (05/24/2019), 1,600 mg (05/31/2019), 1,786 mg (06/14/2019), 1,786 mg (06/28/2019), 1,786 mg (07/12/2019), 1,786 mg (07/19/2019), 1,786 mg (08/16/2019), 1,786 mg (08/31/2019), 1,786 mg (09/27/2019), 1,786 mg (10/11/2019), 1,786 mg (10/25/2019), 1,786 mg (11/08/2019), 1,786 mg (11/23/2019), 1,786 mg (12/08/2019), 1,786 mg (12/28/2019), 1,786 mg (01/19/2020) ondansetron (ZOFRAN) 8 mg, dexamethasone (DECADRON) 10 mg in sodium chloride 0.9 % 50 mL IVPB, , Intravenous,  Once, 8 of 9 cycles Administration:  (05/17/2019),  (05/24/2019),  (05/31/2019),  (06/14/2019),  (06/28/2019),  (07/12/2019),  (07/19/2019),  (08/16/2019),  (08/31/2019),  (09/27/2019),  (10/11/2019),  (10/25/2019),  (11/08/2019),  (11/23/2019),  (12/08/2019),  (12/28/2019),  (01/19/2020)  for chemotherapy treatment.    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  The Common Hereditary Gene Panel offered by [facility] includes sequencing and/or deletion duplication testing of the following 48 genes: APC, ATM, AXIN2, BARD1, BMPR1A, BRCA1, BRCA2, BRIP1, CDH1, CDK4, CDKN2A (p14ARF), CDKN2A (p16INK4a), CHEK2, CTNNA1, DICER1, EPCAM (Deletion/duplication testing only), GREM1 (promoter region deletion/duplication testing only), KIT, MEN1, MLH1, MSH2, MSH3, MSH6, MUTYH, NBN, NF1, NHTL1, PALB2, PDGFRA, PMS2, POLD1, POLE, PTEN, RAD50, RAD51C, RAD51D, RNF43, SDHB, SDHC, SDHD, SMAD4, SMARCA4. STK11, TP53, TSC1, TSC2, and VHL.  The following genes were evaluated for sequence changes only: SDHA and HOXB13 c.251G>A variant only. The report date is August 02, 2019.   02/15/2020 -  Chemotherapy   The patient had  palonosetron (ALOXI) injection 0.25 mg, 0.25  mg, Intravenous,  Once, 1 of 4 cycles Administration: 0.25 mg (02/15/2020) fluorouracil (ADRUCIL) 4,500 mg in sodium chloride 0.9 % 60 mL chemo infusion, 1,920 mg/m2 = 4,500 mg (80 % of original dose 2,400 mg/m2), Intravenous, 1 Day/Dose, 1 of 4 cycles Dose modification: 1,920 mg/m2 (80 % of original dose 2,400 mg/m2, Cycle 1, Reason: Provider Judgment) Administration: 4,500 mg (02/15/2020) irinotecan LIPOSOME (ONIVYDE) 129 mg in sodium chloride 0.9 % 500 mL chemo infusion, 56 mg/m2 = 129 mg (80 % of original dose 70 mg/m2), Intravenous, Once, 1 of 4 cycles Dose modification: 56 mg/m2 (80 % of original dose 70 mg/m2, Cycle 1, Reason: Provider Judgment), 56 mg/m2 (80 % of original dose 70 mg/m2, Cycle 1, Reason: Provider Judgment) Administration: 129 mg (02/15/2020) leucovorin 748 mg in sodium chloride 0.9 % 250 mL infusion, 320 mg/m2 = 748 mg (80 % of original dose 400 mg/m2), Intravenous,  Once, 1 of 4 cycles Dose modification: 320 mg/m2 (80 % of original dose 400 mg/m2, Cycle 1, Reason: Provider Judgment) Administration: 748 mg (02/15/2020)  for chemotherapy treatment.      CANCER STAGING: Cancer Staging No matching staging information was found for the patient.  INTERVAL HISTORY:  [name], a 59 y.o. female, returns for routine follow-up and consideration for next cycle of chemotherapy. Rosalind was last seen on 02/15/2020.  Due for cycle #2 of 5-FU and Onivyde today.   Overall, she tells me she has been feeling pretty well. She had nausea manageable with medication after previous chemotherapy treatment, as well as fatigued and short of breath with exertion. She denies diarrhea and vomiting. She had mouth sores x2 days, then they improved. Her appetite has decreased.   Overall, she feels ready for next cycle of chemo today.   REVIEW OF SYSTEMS:  Review of Systems  Constitutional: Positive for appetite change (Moderately decreased) and fatigue (Severe).  HENT:   Positive for  mouth sores.   Respiratory: Positive for shortness of breath.   Gastrointestinal: Positive for nausea.  Neurological: Positive for numbness (Bottom of feet).    PAST MEDICAL/SURGICAL HISTORY:  Past Medical History:  Diagnosis Date  . Diabetes mellitus without complication ([place])   . Family history of prostate cancer   . Family history of stomach cancer   . Hypertension   . Pancreatic cancer ([employer])   . Port-A-Cath in place 05/16/2019   Past Surgical History:  Procedure Laterality Date  . ABDOMINAL HYSTERECTOMY    . BACK SURGERY    . KIDNEY SURGERY     per pt, had leakage  . tubal ligation Left     SOCIAL HISTORY:  Social History   Socioeconomic History  . Marital status: Divorced    Spouse name: Not on file  . Number of children: 1  . Years of education: Not on file  . Highest education level: Not on file  Occupational History  . Occupation: disabled  Tobacco Use  . Smoking status: Current Every Day Smoker    Packs/day: 1.00    Years: 45.00    Pack years: 45.00    Types: Cigarettes  . Smokeless tobacco: Never Used  Substance and Sexual Activity  . Alcohol use: Not Currently  . Drug use: Never  . Sexual activity: Not on file  Other Topics Concern  . Not on file  Social History Narrative  . Not on file   Social Determinants of Health   Financial Resource Strain: Low Risk   .  Difficulty of Paying Living Expenses: Not hard at all  Food Insecurity: No Food Insecurity  . Worried About Charity fundraiser in the Last Year: Never true  . Ran Out of Food in the Last Year: Never true  Transportation Needs: Unmet Transportation Needs  . Lack of Transportation (Medical): Yes  . Lack of Transportation (Non-Medical): Yes  Physical Activity: Inactive  . Days of Exercise per Week: 0 days  . Minutes of Exercise per Session: 0 min  Stress: No Stress Concern Present  . Feeling of Stress : Not at all  Social Connections: Somewhat Isolated  . Frequency of Communication with  Friends and Family: More than three times a week  . Frequency of Social Gatherings with Friends and Family: More than three times a week  . Attends Religious Services: 1 to 4 times per year  . Active Member of Clubs or Organizations: No  . Attends Archivist Meetings: Never  . Marital Status: Divorced  Human resources officer Violence: Not At Risk  . Fear of Current or Ex-Partner: No  . Emotionally Abused: No  . Physically Abused: No  . Sexually Abused: No    FAMILY HISTORY:  Family History  Problem Relation Age of Onset  . Diabetes Mother   . Hypertension Mother   . Lung cancer Mother 60       d. 52  . Diabetes Father   . Hypertension Father   . Heart disease Brother   . Stomach cancer Paternal Aunt 67  . Stroke Paternal Grandmother   . Prostate cancer Other        PGMs brother  . Stomach cancer Other        PGMs mother    CURRENT MEDICATIONS:  Current Outpatient Medications  Medication Sig Dispense Refill  . amLODipine (NORVASC) 5 MG tablet Take 1 tablet (5 mg total) by mouth 2 (two) times daily. 90 tablet 3  . docusate sodium (COLACE) 100 MG capsule Take 2 capsules (200 mg total) by mouth at bedtime. 10 capsule 0  . Gemcitabine HCl (GEMZAR IV) Inject into the vein. Days 1, 8, 15 q 28 days    . insulin aspart (NOVOLOG) 100 UNIT/ML injection Inject 20 Units into the skin 3 (three) times daily before meals. 10 mL 3  . Insulin Syringe-Needle U-100 (INSULIN SYRINGE 1CC/31GX5/16") 31G X 5/16" 1 ML MISC     . lisinopril (ZESTRIL) 40 MG tablet Take 1 tablet (40 mg total) by mouth daily. 90 tablet 3  . NEEDLE, DISP, 30 G (BD DISP NEEDLES) 30G X 1/2" MISC Use as directed with insulin three times daily 100 each 6  . ondansetron (ZOFRAN) 4 MG tablet TAKE 1 TABLET(4 MG) BY MOUTH THREE TIMES DAILY 20 tablet 3  . PACLitaxel Protein-Bound Part (ABRAXANE IV) Inject into the vein. Days 1, 8, 15 q 28 days    . WIXELA INHUB 250-50 MCG/DOSE AEPB INHALE 1 PUFF INTO THE LUNGS TWICE DAILY 60  each 2  . diphenoxylate-atropine (LOMOTIL) 2.5-0.025 MG tablet Take 1 tablet by mouth 4 (four) times daily as needed for diarrhea or loose stools. (Patient not taking: Reported on 02/29/2020) 30 tablet 3  . Lactulose 20 GM/30ML SOLN Take 30 mLs (20 g total) by mouth at bedtime as needed. (Patient not taking: Reported on 02/29/2020) 450 mL 2  . loperamide (IMODIUM A-D) 2 MG tablet Take 2 at diarrhea onset , then 1 after every watery BM. Please call if not helping (Patient not taking: Reported on 02/29/2020)  100 tablet 1  . oxyCODONE-acetaminophen (PERCOCET) 10-325 MG tablet Take 1 tablet by mouth every 4 (four) hours as needed for pain.     No current facility-administered medications for this visit.    ALLERGIES:  Allergies  Allergen Reactions  . Hydrocodone Hives  . Tramadol Itching    PHYSICAL EXAM:  Performance status (ECOG): 1 - Symptomatic but completely ambulatory  Vitals:   02/29/20 0854  BP: (!) 128/52  Pulse: 84  Resp: 18  Temp: 97.8 F (36.6 C)  SpO2: 99%   Wt Readings from Last 3 Encounters:  02/29/20 240 lb (108.9 kg)  02/15/20 237 lb 8 oz (107.7 kg)  02/09/20 244 lb 8 oz (110.9 kg)   Physical Exam Vitals reviewed.  Constitutional:      Appearance: Normal appearance.  Cardiovascular:     Rate and Rhythm: Normal rate and regular rhythm.  Pulmonary:     Effort: Pulmonary effort is normal.     Breath sounds: Normal breath sounds.  Abdominal:     Palpations: Abdomen is soft. There is no mass.     Tenderness: There is no abdominal tenderness.  Neurological:     Mental Status: She is alert.  Psychiatric:        Mood and Affect: Mood normal.        Behavior: Behavior normal.     LABORATORY DATA:  I have reviewed the labs as listed.  CBC Latest Ref Rng & Units 02/29/2020 02/15/2020 02/09/2020  WBC 4.0 - 10.5 K/uL 5.8 8.5 7.0  Hemoglobin 12.0 - 15.0 g/dL 12.6 14.3 12.5  Hematocrit 36.0 - 46.0 % 41.5 46.2(H) 40.8  Platelets 150 - 400 K/uL 194 235 245   CMP Latest  Ref Rng & Units 02/29/2020 02/15/2020 02/09/2020  Glucose 70 - 99 mg/dL 284(H) 300(H) 138(H)  BUN 6 - 20 mg/dL '12 11 10  ' Creatinine 0.44 - 1.00 mg/dL 0.82 0.77 0.74  Sodium 135 - 145 mmol/L 135 134(L) 137  Potassium 3.5 - 5.1 mmol/L 3.8 3.8 3.8  Chloride 98 - 111 mmol/L 96(L) 96(L) 99  CO2 22 - 32 mmol/L '28 23 27  ' Calcium 8.9 - 10.3 mg/dL 9.1 9.2 8.7(L)  Total Protein 6.5 - 8.1 g/dL 6.5 7.0 6.3(L)  Total Bilirubin 0.3 - 1.2 mg/dL 0.4 0.7 0.3  Alkaline Phos 38 - 126 U/L 82 88 85  AST 15 - 41 U/L '18 27 25  ' ALT 0 - 44 U/L '20 23 21    ' DIAGNOSTIC IMAGING:  I have independently reviewed the scans and discussed with the patient. CT Chest W Contrast  Result Date: 02/08/2020 CLINICAL DATA:  History of pancreatic neoplasm with metastatic disease to the liver. EXAM: CT CHEST, ABDOMEN, AND PELVIS WITH CONTRAST TECHNIQUE: Multidetector CT imaging of the chest, abdomen and pelvis was performed following the standard protocol during bolus administration of intravenous contrast. CONTRAST:  126m OMNIPAQUE IOHEXOL 300 MG/ML  SOLN COMPARISON:  09/19/2019 FINDINGS: CT CHEST FINDINGS Cardiovascular: Heart size is stable and normal without pericardial effusion. RIGHT-sided central venous access device, Port-A-Cath terminates at the caval to atrial junction. Scattered aortic calcifications. No sign of aneurysm. Central pulmonary arteries are unremarkable. Mediastinum/Nodes: No thoracic inlet adenopathy. No axillary lymphadenopathy. No mediastinal lymphadenopathy. No hilar lymphadenopathy. Lungs/Pleura: Signs of pulmonary metastatic disease are considered based on enlargement of existing pulmonary nodules with new pulmonary nodules having developed since the previous imaging evaluation. (Image 75, series 10) 5 mm LEFT lower lobe pulmonary nodule previously 3 mm. RIGHT lower lobe pulmonary nodule  abutting the pleura (image 80, series 10) 5 mm previously 3 mm. Other small nodules more numerous than on the prior exam but  remaining less than a cm are scattered throughout the RIGHT lung base, on image 109 of series 10 is a new 6 mm pulmonary nodule. On image 90 of series 10 is a new 4 mm RIGHT lower lobe pulmonary nodule a background of pulmonary emphysema is demonstrated, similar to prior examination. Signs of lingular scarring. Airways are patent. Musculoskeletal: No sign of chest wall mass. CT ABDOMEN PELVIS FINDINGS Hepatobiliary: Hepatic steatosis. Lobular hepatic contours. Low-density lesion in the posterior RIGHT hemi liver (image 99, series 3) 11 mm, previously 9 mm. No pericholecystic stranding. Stable dilation of the common bile duct up to 6 mm. Pancreas: Soft tissue in the body and tail of the pancreas encasing vascular structures in the upper abdomen including the celiac trunk measuring 5.5 x 3.7 cm as compared to 3.2 x 1.9 cm. Soft tissue stranding extending into the hepatic gastric ligament and towards the ligament of Treitz in the upper abdomen also with involvement of the LEFT adrenal by direct extension. 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 Spleen: Normal size without focal lesion. Adrenals/Urinary Tract: LEFT adrenal with direct involvement from pancreatic mass. Renal cortical scarring and dilated collecting systems on the RIGHT with similar appearance. As compared to previous exams. Normal enhancement of the LEFT kidney. Nephrolithiasis on the RIGHT similar to prior studies as well. Stomach/Bowel: Soft tissue extending towards small-bowel at the ligament of Treitz as described. No sign of small bowel obstruction or acute small bowel process. Stool fills much of the colon. Abundant stool and gas in the rectum. Appendix is normal. Vascular/Lymphatic: Calcified and noncalcified plaque in the abdominal aorta. No aneurysm. No adenopathy in the retroperitoneum. No adenopathy in the upper abdomen. Ill-defined soft tissue as stated above extending into hepatic gastric ligament and  ligament of Treitz. Reproductive: No sign of pelvic lymphadenopathy. Post hysterectomy. Nodular soft tissue at the vaginal apex (image 183, series 3) this is in the upper portion of the cul-de-sac just above the urinary bladder also seen on the coronal images (image 70, series 11) this area measures approximately 2.5 x 1.8 cm. This is associated with nodularity in the sigmoid mesocolon (image 181, series 3) also with nodularity along the RIGHT hepatic margin and just below the gallbladder fossa (image 121, series 3) this area measuring 6 mm greatest thickness by 2.2 cm. Other: As above with suspected peritoneal disease Musculoskeletal: No acute bone finding. No destructive bone process. IMPRESSION: 1. New and enlarging pulmonary nodules suspicious for pulmonary metastatic disease. 2. Enlarging primary pancreatic lesion with infiltration of soft tissue planes about the pancreas and further vascular encasement. 3. Signs of peritoneal disease. 4. Enlarging hepatic lesion on a background of marked hepatic steatosis. Electronically Signed   By: Zetta Bills M.D.   On: 02/08/2020 13:08   CT Abdomen Pelvis W Contrast  Result Date: 02/08/2020 CLINICAL DATA:  History of pancreatic neoplasm with metastatic disease to the liver. EXAM: CT CHEST, ABDOMEN, AND PELVIS WITH CONTRAST TECHNIQUE: Multidetector CT imaging of the chest, abdomen and pelvis was performed following the standard protocol during bolus administration of intravenous contrast. CONTRAST:  132m OMNIPAQUE IOHEXOL 300 MG/ML  SOLN COMPARISON:  09/19/2019 FINDINGS: CT CHEST FINDINGS Cardiovascular: Heart size is stable and normal without pericardial effusion. RIGHT-sided central venous access device, Port-A-Cath terminates at the caval to atrial junction. Scattered aortic calcifications. No sign of  aneurysm. Central pulmonary arteries are unremarkable. Mediastinum/Nodes: No thoracic inlet adenopathy. No axillary lymphadenopathy. No mediastinal lymphadenopathy.  No hilar lymphadenopathy. Lungs/Pleura: Signs of pulmonary metastatic disease are considered based on enlargement of existing pulmonary nodules with new pulmonary nodules having developed since the previous imaging evaluation. (Image 75, series 10) 5 mm LEFT lower lobe pulmonary nodule previously 3 mm. RIGHT lower lobe pulmonary nodule abutting the pleura (image 80, series 10) 5 mm previously 3 mm. Other small nodules more numerous than on the prior exam but remaining less than a cm are scattered throughout the RIGHT lung base, on image 109 of series 10 is a new 6 mm pulmonary nodule. On image 90 of series 10 is a new 4 mm RIGHT lower lobe pulmonary nodule a background of pulmonary emphysema is demonstrated, similar to prior examination. Signs of lingular scarring. Airways are patent. Musculoskeletal: No sign of chest wall mass. CT ABDOMEN PELVIS FINDINGS Hepatobiliary: Hepatic steatosis. Lobular hepatic contours. Low-density lesion in the posterior RIGHT hemi liver (image 99, series 3) 11 mm, previously 9 mm. No pericholecystic stranding. Stable dilation of the common bile duct up to 6 mm. Pancreas: Soft tissue in the body and tail of the pancreas encasing vascular structures in the upper abdomen including the celiac trunk measuring 5.5 x 3.7 cm as compared to 3.2 x 1.9 cm. Soft tissue stranding extending into the hepatic gastric ligament and towards the ligament of Treitz in the upper abdomen also with involvement of the LEFT adrenal by direct extension. 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 Spleen: Normal size without focal lesion. Adrenals/Urinary Tract: LEFT adrenal with direct involvement from pancreatic mass. Renal cortical scarring and dilated collecting systems on the RIGHT with similar appearance. As compared to previous exams. Normal enhancement of the LEFT kidney. Nephrolithiasis on the RIGHT similar to prior studies as well. Stomach/Bowel: Soft tissue extending  towards small-bowel at the ligament of Treitz as described. No sign of small bowel obstruction or acute small bowel process. Stool fills much of the colon. Abundant stool and gas in the rectum. Appendix is normal. Vascular/Lymphatic: Calcified and noncalcified plaque in the abdominal aorta. No aneurysm. No adenopathy in the retroperitoneum. No adenopathy in the upper abdomen. Ill-defined soft tissue as stated above extending into hepatic gastric ligament and ligament of Treitz. Reproductive: No sign of pelvic lymphadenopathy. Post hysterectomy. Nodular soft tissue at the vaginal apex (image 183, series 3) this is in the upper portion of the cul-de-sac just above the urinary bladder also seen on the coronal images (image 70, series 11) this area measures approximately 2.5 x 1.8 cm. This is associated with nodularity in the sigmoid mesocolon (image 181, series 3) also with nodularity along the RIGHT hepatic margin and just below the gallbladder fossa (image 121, series 3) this area measuring 6 mm greatest thickness by 2.2 cm. Other: As above with suspected peritoneal disease Musculoskeletal: No acute bone finding. No destructive bone process. IMPRESSION: 1. New and enlarging pulmonary nodules suspicious for pulmonary metastatic disease. 2. Enlarging primary pancreatic lesion with infiltration of soft tissue planes about the pancreas and further vascular encasement. 3. Signs of peritoneal disease. 4. Enlarging hepatic lesion on a background of marked hepatic steatosis. Electronically Signed   By: Zetta Bills M.D.   On: 02/08/2020 13:08     ASSESSMENT: 1.  Metastatic pancreatic cancer to the liver and lungs, MSI stable:  -8 cycles of gemcitabine and Abraxane from 05/17/2019 through 12/28/2019 with progression. -CT CAP on 02/07/2020  showed subcentimeter new pulmonary nodules.  Pancreatic tail mass measured 5.5 x 3.7 cm (3.2 x 1.9 cm).  Hepatic steatosis with lobular contours.  Low-density lesion in the posterior  right hemiliver 11 mm, previously 9 mm.  Some nodularity in the sigmoid mesocolon and along the right hepatic margin just above the gallbladder fossa. -5-FU and Onivyde started on 02/15/2019 with 20% dose reduction.   PLAN:  1.  Metastatic pancreatic cancer to the liver and lungs: -She has tolerated first cycle of 5-FU and Onivyde reasonably well.  She had side effects including nausea which lasted for a day without vomiting, controlled with Compazine.  Also had mouth sores for 2 days.  I have recommended making a solution with 1 quart of water and 1 tablespoon each of baking soda and salt and rinse her mouth every 2 hours during the daytime.  She also felt weaker and decrease in appetite. -I have reviewed her labs today.  LFTs are normal.  White count and platelet count is normal. -We will proceed with cycle 2 today.  I will decrease her chemotherapy by another 10%. -We will reevaluate her in 2 weeks.  2.  Peripheral neuropathy: -Continue gabapentin 300 mg 3 times a day.  No worsening in numbness.  3.  Constipation: -Continue Colace 200 mg at bedtime and take lactulose as needed.  4.  Hypertension: -Continue Norvasc 5 mg daily.    Orders placed this encounter:  No orders of the defined types were placed in this encounter.    Derek Jack, MD [facility] [phone]   I, Jacqualyn Posey, am acting as a scribe for Dr. Sanda Linger.  I, Derek Jack MD, have reviewed the above documentation for accuracy and completeness, and I agree with the above.

## 2020-03-02 ENCOUNTER — Encounter (HOSPITAL_COMMUNITY): Payer: Self-pay

## 2020-03-02 ENCOUNTER — Inpatient Hospital Stay (HOSPITAL_COMMUNITY): Payer: Medicare HMO

## 2020-03-02 ENCOUNTER — Other Ambulatory Visit: Payer: Self-pay

## 2020-03-02 VITALS — BP 115/63 | HR 81 | Temp 97.3°F | Resp 18

## 2020-03-02 DIAGNOSIS — C787 Secondary malignant neoplasm of liver and intrahepatic bile duct: Secondary | ICD-10-CM

## 2020-03-02 DIAGNOSIS — Z95828 Presence of other vascular implants and grafts: Secondary | ICD-10-CM

## 2020-03-02 DIAGNOSIS — Z5111 Encounter for antineoplastic chemotherapy: Secondary | ICD-10-CM | POA: Diagnosis not present

## 2020-03-02 MED ORDER — HEPARIN SOD (PORK) LOCK FLUSH 100 UNIT/ML IV SOLN
500.0000 [IU] | Freq: Once | INTRAVENOUS | Status: AC | PRN
  Administered 2020-03-02: 500 [IU]

## 2020-03-02 MED ORDER — SODIUM CHLORIDE 0.9% FLUSH
10.0000 mL | INTRAVENOUS | Status: DC | PRN
  Administered 2020-03-02: 10 mL

## 2020-03-02 NOTE — Progress Notes (Signed)
Patient in for chemotherapy pump disconnect.  Patient stated last night when she was changing her shirt the dressing was loose from sweating and the needle was pulled out of her port site.  The port needle and chemotherapy bag found inside the chemotherapy bag that is sent home with the patient.  The patient stated she stopped the pump.  Total 46 ml volume left to be infused noted from the pump and pharmacy notified.   No s/s of distress noted.    Port site clean and dry with no bruising or swelling noted.  Only stated some tenderness with touch.  Patients port reaccessed and flushed per policy with good blood return noted.  Band aid applied.    Francene Finders, NP, notified and in to assess the patient.  Patient instructed when she needs to come to the ER if any increased tenderness, swelling or bruising should occur with understanding verbalized.    Patient left ambulatory in satisfactory condition with no s/s of distress noted.

## 2020-03-14 ENCOUNTER — Inpatient Hospital Stay (HOSPITAL_COMMUNITY): Payer: Medicare HMO

## 2020-03-14 ENCOUNTER — Inpatient Hospital Stay (HOSPITAL_BASED_OUTPATIENT_CLINIC_OR_DEPARTMENT_OTHER): Payer: Medicare HMO | Admitting: Hematology

## 2020-03-14 ENCOUNTER — Other Ambulatory Visit: Payer: Self-pay

## 2020-03-14 VITALS — BP 133/72 | HR 78 | Temp 97.3°F | Resp 18

## 2020-03-14 VITALS — BP 125/67 | HR 97 | Temp 98.8°F | Resp 18 | Wt 235.0 lb

## 2020-03-14 DIAGNOSIS — Z5111 Encounter for antineoplastic chemotherapy: Secondary | ICD-10-CM | POA: Diagnosis not present

## 2020-03-14 DIAGNOSIS — C787 Secondary malignant neoplasm of liver and intrahepatic bile duct: Secondary | ICD-10-CM

## 2020-03-14 DIAGNOSIS — C259 Malignant neoplasm of pancreas, unspecified: Secondary | ICD-10-CM | POA: Diagnosis not present

## 2020-03-14 DIAGNOSIS — Z95828 Presence of other vascular implants and grafts: Secondary | ICD-10-CM

## 2020-03-14 LAB — COMPREHENSIVE METABOLIC PANEL
ALT: 20 U/L (ref 0–44)
AST: 23 U/L (ref 15–41)
Albumin: 3.8 g/dL (ref 3.5–5.0)
Alkaline Phosphatase: 76 U/L (ref 38–126)
Anion gap: 13 (ref 5–15)
BUN: 7 mg/dL (ref 6–20)
CO2: 27 mmol/L (ref 22–32)
Calcium: 9.2 mg/dL (ref 8.9–10.3)
Chloride: 95 mmol/L — ABNORMAL LOW (ref 98–111)
Creatinine, Ser: 0.81 mg/dL (ref 0.44–1.00)
GFR calc Af Amer: >60 mL/min (ref >60)
GFR calc non Af Amer: >60 mL/min (ref >60)
Glucose, Bld: 276 mg/dL — ABNORMAL HIGH (ref 70–99)
Potassium: 3.9 mmol/L (ref 3.5–5.1)
Sodium: 135 mmol/L (ref 135–145)
Total Bilirubin: 0.4 mg/dL (ref 0.3–1.2)
Total Protein: 6.9 g/dL (ref 6.5–8.1)

## 2020-03-14 LAB — CBC WITH DIFFERENTIAL/PLATELET
Abs Immature Granulocytes: 0.02 10*3/uL (ref 0.00–0.07)
Basophils Absolute: 0 10*3/uL (ref 0.0–0.1)
Basophils Relative: 1 %
Eosinophils Absolute: 0.2 10*3/uL (ref 0.0–0.5)
Eosinophils Relative: 3 %
HCT: 44.3 % (ref 36.0–46.0)
Hemoglobin: 13.6 g/dL (ref 12.0–15.0)
Immature Granulocytes: 0 %
Lymphocytes Relative: 30 %
Lymphs Abs: 2 10*3/uL (ref 0.7–4.0)
MCH: 24.5 pg — ABNORMAL LOW (ref 26.0–34.0)
MCHC: 30.7 g/dL (ref 30.0–36.0)
MCV: 79.8 fL — ABNORMAL LOW (ref 80.0–100.0)
Monocytes Absolute: 0.7 10*3/uL (ref 0.1–1.0)
Monocytes Relative: 11 %
Neutro Abs: 3.7 10*3/uL (ref 1.7–7.7)
Neutrophils Relative %: 55 %
Platelets: 231 10*3/uL (ref 150–400)
RBC: 5.55 MIL/uL — ABNORMAL HIGH (ref 3.87–5.11)
RDW: 17.6 % — ABNORMAL HIGH (ref 11.5–15.5)
WBC: 6.6 10*3/uL (ref 4.0–10.5)
nRBC: 0 % (ref 0.0–0.2)

## 2020-03-14 LAB — MAGNESIUM: Magnesium: 1.8 mg/dL (ref 1.7–2.4)

## 2020-03-14 MED ORDER — SODIUM CHLORIDE 0.9 % IV SOLN
1680.0000 mg/m2 | INTRAVENOUS | Status: DC
  Administered 2020-03-14: 3950 mg via INTRAVENOUS
  Filled 2020-03-14: qty 79

## 2020-03-14 MED ORDER — ATROPINE SULFATE 1 MG/ML IJ SOLN
1.0000 mg | Freq: Once | INTRAMUSCULAR | Status: AC
  Administered 2020-03-14: 1 mg via INTRAVENOUS
  Filled 2020-03-14: qty 1

## 2020-03-14 MED ORDER — SODIUM CHLORIDE 0.9 % IV SOLN
49.0000 mg/m2 | Freq: Once | INTRAVENOUS | Status: AC
  Administered 2020-03-14: 116.1 mg via INTRAVENOUS
  Filled 2020-03-14: qty 27

## 2020-03-14 MED ORDER — SODIUM CHLORIDE 0.9 % IV SOLN: Freq: Once | INTRAVENOUS | Status: AC

## 2020-03-14 MED ORDER — SODIUM CHLORIDE 0.9 % IV SOLN
280.0000 mg/m2 | Freq: Once | INTRAVENOUS | Status: AC
  Administered 2020-03-14: 656 mg via INTRAVENOUS
  Filled 2020-03-14: qty 32.8

## 2020-03-14 MED ORDER — PALONOSETRON HCL INJECTION 0.25 MG/5ML
0.2500 mg | Freq: Once | INTRAVENOUS | Status: AC
  Administered 2020-03-14: 0.25 mg via INTRAVENOUS
  Filled 2020-03-14: qty 5

## 2020-03-14 MED ORDER — SODIUM CHLORIDE 0.9% FLUSH
10.0000 mL | INTRAVENOUS | Status: DC | PRN
  Administered 2020-03-14: 10 mL via INTRAVENOUS

## 2020-03-14 MED ORDER — SODIUM CHLORIDE 0.9 % IV SOLN
10.0000 mg | Freq: Once | INTRAVENOUS | Status: AC
  Administered 2020-03-14: 10 mg via INTRAVENOUS
  Filled 2020-03-14: qty 10

## 2020-03-14 NOTE — Progress Notes (Signed)
[place], [place] [address]   CLINIC:  Medical Oncology/Hematology  PCP:  Doris Delay, DO [address] / [place] [address] [phone]   REASON FOR VISIT:  Follow-up for metastatic pancreatic cancer to the liver  CURRENT THERAPY: 5-FU, Onivyde and Aloxi  BRIEF ONCOLOGIC HISTORY:  Oncology History  Pancreatic cancer metastasized to liver ([place])  05/09/2019 Initial Diagnosis   Pancreatic cancer metastasized to liver ([place])   05/17/2019 - 02/08/2020 Chemotherapy   The patient had PACLitaxel-protein bound (ABRAXANE) chemo infusion 275 mg, 125 mg/m2 = 275 mg, Intravenous,  Once, 8 of 9 cycles Dose modification: 100 mg/m2 (80 % of original dose 125 mg/m2, Cycle 1, Reason: Other (see comments), Comment: neutropenia), 93.75 mg/m2 (75 % of original dose 125 mg/m2, Cycle 1, Reason: Other (see comments), Comment: neutropenia), 100 mg/m2 (80 % of original dose 125 mg/m2, Cycle 2, Reason: Other (see comments), Comment: cytopenias with previous treatment), 75 mg/m2 (original dose 125 mg/m2, Cycle 3, Reason: Provider Judgment, Comment: neuropathy stated as 10/10 dose reduce required per NP Doris [name]) Administration: 275 mg (05/17/2019), 225 mg (05/24/2019), 200 mg (05/31/2019), 225 mg (06/14/2019), 225 mg (06/28/2019), 225 mg (07/12/2019), 175 mg (07/19/2019), 175 mg (08/16/2019), 175 mg (08/31/2019), 175 mg (09/27/2019), 175 mg (10/11/2019), 175 mg (10/25/2019), 175 mg (11/08/2019), 175 mg (11/23/2019), 175 mg (12/08/2019), 175 mg (12/28/2019), 175 mg (01/19/2020) ondansetron (ZOFRAN) 8 mg in sodium chloride 0.9 % 50 mL IVPB, 8 mg (100 % of original dose 8 mg), Intravenous,  Once, 1 of 1 cycle Dose modification: 8 mg (original dose 8 mg, Cycle 1) gemcitabine (GEMZAR) 2,200 mg in sodium chloride 0.9 % 250 mL chemo infusion, 2,204 mg, Intravenous,  Once, 8 of 9 cycles Dose modification: 750 mg/m2 (75 % of original dose 1,000 mg/m2, Cycle 1, Reason: Other (see comments),  Comment: neutropenia), 750 mg/m2 (75 % of original dose 1,000 mg/m2, Cycle 1, Reason: Other (see comments), Comment: neutropenia), 800 mg/m2 (80 % of original dose 1,000 mg/m2, Cycle 2, Reason: Other (see comments), Comment: cytopenia) Administration: 2,200 mg (05/17/2019), 1,600 mg (05/24/2019), 1,600 mg (05/31/2019), 1,786 mg (06/14/2019), 1,786 mg (06/28/2019), 1,786 mg (07/12/2019), 1,786 mg (07/19/2019), 1,786 mg (08/16/2019), 1,786 mg (08/31/2019), 1,786 mg (09/27/2019), 1,786 mg (10/11/2019), 1,786 mg (10/25/2019), 1,786 mg (11/08/2019), 1,786 mg (11/23/2019), 1,786 mg (12/08/2019), 1,786 mg (12/28/2019), 1,786 mg (01/19/2020) ondansetron (ZOFRAN) 8 mg, dexamethasone (DECADRON) 10 mg in sodium chloride 0.9 % 50 mL IVPB, , Intravenous,  Once, 8 of 9 cycles Administration:  (05/17/2019),  (05/24/2019),  (05/31/2019),  (06/14/2019),  (06/28/2019),  (07/12/2019),  (07/19/2019),  (08/16/2019),  (08/31/2019),  (09/27/2019),  (10/11/2019),  (10/25/2019),  (11/08/2019),  (11/23/2019),  (12/08/2019),  (12/28/2019),  (01/19/2020)  for chemotherapy treatment.    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  The Common Hereditary Gene Panel offered by [facility] includes sequencing and/or deletion duplication testing of the following 48 genes: APC, ATM, AXIN2, BARD1, BMPR1A, BRCA1, BRCA2, BRIP1, CDH1, CDK4, CDKN2A (p14ARF), CDKN2A (p16INK4a), CHEK2, CTNNA1, DICER1, EPCAM (Deletion/duplication testing only), GREM1 (promoter region deletion/duplication testing only), KIT, MEN1, MLH1, MSH2, MSH3, MSH6, MUTYH, NBN, NF1, NHTL1, PALB2, PDGFRA, PMS2, POLD1, POLE, PTEN, RAD50, RAD51C, RAD51D, RNF43, SDHB, SDHC, SDHD, SMAD4, SMARCA4. STK11, TP53, TSC1, TSC2, and VHL.  The following genes were evaluated for sequence changes only: SDHA and HOXB13 c.251G>A variant only. The report date is August 02, 2019.   02/15/2020 -  Chemotherapy   The patient  had palonosetron (ALOXI) injection 0.25  mg, 0.25 mg, Intravenous,  Once, 2 of 6 cycles Administration: 0.25 mg (02/15/2020), 0.25 mg (02/29/2020) fluorouracil (ADRUCIL) 4,500 mg in sodium chloride 0.9 % 60 mL chemo infusion, 1,920 mg/m2 = 4,500 mg (80 % of original dose 2,400 mg/m2), Intravenous, 1 Day/Dose, 2 of 6 cycles Dose modification: 1,920 mg/m2 (80 % of original dose 2,400 mg/m2, Cycle 1, Reason: Provider Judgment), 1,680 mg/m2 (70 % of original dose 2,400 mg/m2, Cycle 2, Reason: Other (see comments), Comment: side effects) Administration: 4,500 mg (02/15/2020), 3,950 mg (02/29/2020) irinotecan LIPOSOME (ONIVYDE) 129 mg in sodium chloride 0.9 % 500 mL chemo infusion, 56 mg/m2 = 129 mg (80 % of original dose 70 mg/m2), Intravenous, Once, 2 of 6 cycles Dose modification: 56 mg/m2 (80 % of original dose 70 mg/m2, Cycle 1, Reason: Provider Judgment), 56 mg/m2 (80 % of original dose 70 mg/m2, Cycle 1, Reason: Provider Judgment), 49 mg/m2 (70 % of original dose 70 mg/m2, Cycle 2, Reason: Other (see comments), Comment: side effects) Administration: 129 mg (02/15/2020), 116.1 mg (02/29/2020) leucovorin 748 mg in sodium chloride 0.9 % 250 mL infusion, 320 mg/m2 = 748 mg (80 % of original dose 400 mg/m2), Intravenous,  Once, 2 of 6 cycles Dose modification: 320 mg/m2 (80 % of original dose 400 mg/m2, Cycle 1, Reason: Provider Judgment), 280 mg/m2 (70 % of original dose 400 mg/m2, Cycle 2, Reason: Other (see comments), Comment: side effects) Administration: 748 mg (02/15/2020), 656 mg (02/29/2020)  for chemotherapy treatment.      CANCER STAGING: Cancer Staging No matching staging information was found for the patient.  INTERVAL HISTORY:  [name], a 59 y.o. female, returns for routine follow-up and consideration for next cycle of chemotherapy. Doris [name] was last seen on 02/29/2020.  Due for cycle #3 of 5-FU, Onivyde and Aloxi today.   Overall, she tells me she has been feeling pretty well and tolerated the last treatment well. She denies  having nausea or mouth sores. She is still taking lactulose. Her appetite is good.  Overall, she feels ready for next cycle of chemo today.    REVIEW OF SYSTEMS:  Review of Systems  Constitutional: Positive for appetite change (mildly decreased) and fatigue (severe).  HENT:   Negative for mouth sores.   Respiratory: Positive for shortness of breath.   Gastrointestinal: Negative for nausea.  All other systems reviewed and are negative.   PAST MEDICAL/SURGICAL HISTORY:  Past Medical History:  Diagnosis Date   Diabetes mellitus without complication ([place])    Family history of prostate cancer    Family history of stomach cancer    Hypertension    Pancreatic cancer ([place])    Port-A-Cath in place 05/16/2019   Past Surgical History:  Procedure Laterality Date   ABDOMINAL HYSTERECTOMY     BACK SURGERY     KIDNEY SURGERY     per pt, had leakage   tubal ligation Left     SOCIAL HISTORY:  Social History   Socioeconomic History   Marital status: Divorced    Spouse name: Not on file   Number of children: 1   Years of education: Not on file   Highest education level: Not on file  Occupational History   Occupation: disabled  Tobacco Use   Smoking status: Current Every Day Smoker    Packs/day: 1.00    Years: 45.00    Pack years: 45.00    Types: Cigarettes   Smokeless tobacco: Never Used  Media planner  Vaping Use: Never used  Substance and Sexual Activity   Alcohol use: Not Currently   Drug use: Never   Sexual activity: Not on file  Other Topics Concern   Not on file  Social History Narrative   Not on file   Social Determinants of Health   Financial Resource Strain: Low Risk    Difficulty of Paying Living Expenses: Not hard at all  Food Insecurity: No Food Insecurity   Worried About Charity fundraiser in the Last Year: Never true   Ran Out of Food in the Last Year: Never true  Transportation Needs: Unmet Transportation Needs   Lack of  Transportation (Medical): Yes   Lack of Transportation (Non-Medical): Yes  Physical Activity: Inactive   Days of Exercise per Week: 0 days   Minutes of Exercise per Session: 0 min  Stress: No Stress Concern Present   Feeling of Stress : Not at all  Social Connections: Moderately Isolated   Frequency of Communication with Friends and Family: More than three times a week   Frequency of Social Gatherings with Friends and Family: More than three times a week   Attends Religious Services: 1 to 4 times per year   Active Member of [employer] or Organizations: No   Attends Music therapist: Never   Marital Status: Divorced  Human resources officer Violence: Not At Risk   Fear of Current or Ex-Partner: No   Emotionally Abused: No   Physically Abused: No   Sexually Abused: No    FAMILY HISTORY:  Family History  Problem Relation Age of Onset   Diabetes Mother    Hypertension Mother    Lung cancer Mother 40       d. 46   Diabetes Father    Hypertension Father    Heart disease Brother    Stomach cancer Paternal Aunt 35   Stroke Paternal Grandmother    Prostate cancer Other        PGMs brother   Stomach cancer Other        PGMs mother    CURRENT MEDICATIONS:  Current Outpatient Medications  Medication Sig Dispense Refill   amLODipine (NORVASC) 5 MG tablet Take 1 tablet (5 mg total) by mouth 2 (two) times daily. 90 tablet 3   docusate sodium (COLACE) 100 MG capsule Take 2 capsules (200 mg total) by mouth at bedtime. 10 capsule 0   Gemcitabine HCl (GEMZAR IV) Inject into the vein. Days 1, 8, 15 q 28 days     insulin aspart (NOVOLOG) 100 UNIT/ML injection Inject 20 Units into the skin 3 (three) times daily before meals. 10 mL 3   Insulin Syringe-Needle U-100 (INSULIN SYRINGE 1CC/31GX5/16") 31G X 5/16" 1 ML MISC      lisinopril (ZESTRIL) 40 MG tablet Take 1 tablet (40 mg total) by mouth daily. 90 tablet 3   loperamide (IMODIUM A-D) 2 MG tablet Take 2 at  diarrhea onset , then 1 after every watery BM. Please call if not helping 100 tablet 1   NEEDLE, DISP, 30 G (BD DISP NEEDLES) 30G X 1/2" MISC Use as directed with insulin three times daily 100 each 6   PACLitaxel Protein-Bound Part (ABRAXANE IV) Inject into the vein. Days 1, 8, 15 q 28 days     WIXELA INHUB 250-50 MCG/DOSE AEPB INHALE 1 PUFF INTO THE LUNGS TWICE DAILY 60 each 2   diphenoxylate-atropine (LOMOTIL) 2.5-0.025 MG tablet Take 1 tablet by mouth 4 (four) times daily as needed for diarrhea  or loose stools. (Patient not taking: Reported on 03/14/2020) 30 tablet 3   Lactulose 20 GM/30ML SOLN Take 30 mLs (20 g total) by mouth at bedtime as needed. (Patient not taking: Reported on 03/14/2020) 450 mL 2   ondansetron (ZOFRAN) 4 MG tablet TAKE 1 TABLET(4 MG) BY MOUTH THREE TIMES DAILY (Patient not taking: Reported on 03/14/2020) 60 tablet 3   oxyCODONE-acetaminophen (PERCOCET) 10-325 MG tablet Take 1 tablet by mouth every 4 (four) hours as needed for pain. (Patient not taking: Reported on 03/14/2020)     No current facility-administered medications for this visit.    ALLERGIES:  Allergies  Allergen Reactions   Hydrocodone Hives   Tramadol Itching    PHYSICAL EXAM:  Performance status (ECOG): 1 - Symptomatic but completely ambulatory  Vitals:   03/14/20 0859  BP: 125/67  Pulse: 97  Resp: 18  Temp: 98.8 F (37.1 C)  SpO2: 98%   Wt Readings from Last 3 Encounters:  03/14/20 235 lb (106.6 kg)  02/29/20 240 lb (108.9 kg)  02/15/20 237 lb 8 oz (107.7 kg)   Physical Exam Vitals reviewed.  Constitutional:      Appearance: Normal appearance.  Cardiovascular:     Rate and Rhythm: Normal rate and regular rhythm.     Pulses: Normal pulses.     Heart sounds: Normal heart sounds.  Pulmonary:     Effort: Pulmonary effort is normal.     Breath sounds: Normal breath sounds.  Abdominal:     Palpations: Abdomen is soft. There is no mass.     Tenderness: There is no abdominal  tenderness.  Neurological:     General: No focal deficit present.     Mental Status: She is alert and oriented to person, place, and time.  Psychiatric:        Mood and Affect: Mood normal.        Behavior: Behavior normal.     LABORATORY DATA:  I have reviewed the labs as listed.  CBC Latest Ref Rng & Units 03/14/2020 02/29/2020 02/15/2020  WBC 4.0 - 10.5 K/uL 6.6 5.8 8.5  Hemoglobin 12.0 - 15.0 g/dL 13.6 12.6 14.3  Hematocrit 36 - 46 % 44.3 41.5 46.2(H)  Platelets 150 - 400 K/uL 231 194 235   CMP Latest Ref Rng & Units 02/29/2020 02/15/2020 02/09/2020  Glucose 70 - 99 mg/dL 284(H) 300(H) 138(H)  BUN 6 - 20 mg/dL _0 Creatinine 0.44 - 1.00 mg/dL 0.82 0.77 0.74  Sodium 135 - 145 mmol/L 135 134(L) 137  Potassium 3.5 - 5.1 mmol/L 3.8 3.8 3.8  Chloride 98 - 111 mmol/L 96(L) 96(L) 99  CO2 22 - 32 mmol/L _1 Calcium 8.9 - 10.3 mg/dL 9.1 9.2 8.7(L)  Total Protein 6.5 - 8.1 g/dL 6.5 7.0 6.3(L)  Total Bilirubin 0.3 - 1.2 mg/dL 0.4 0.7 0.3  Alkaline Phos 38 - 126 U/L 82 88 85  AST 15 - 41 U/L _2 ALT 0 - 44 U/L _3 DIAGNOSTIC IMAGING:  I have independently reviewed the scans and discussed with the patient. No results found.   ASSESSMENT:  1. Metastatic pancreatic cancer to the liver and lungs, MSI stable:  -8 cycles of gemcitabine and Abraxane from 05/17/2019 through 12/28/2019 with progression. -CT CAP on 02/07/2020 showed subcentimeter new pulmonary nodules.  Pancreatic tail mass measured 5.5 x 3.7 cm (3.2 x 1.9 cm).  Hepatic steatosis with lobular contours.  Low-density lesion in the posterior  right hemiliver 11 mm, previously 9 mm.  Some nodularity in the sigmoid mesocolon and along the right hepatic margin just above the gallbladder fossa. -5-FU and Onivyde started on 02/15/2019 with 20% dose reduction.   PLAN:  1.  Metastatic pancreatic cancer to the liver and lungs: -She tolerated cycle 2 very well after further dose reduction. -I have reviewed her labs  today.  White count and LFTs are adequate to proceed with next cycle. -She will continue another treatment in 2 weeks and see [place] back in 4 weeks.  I plan to repeat scans after cycle 6.  2.  Peripheral neuropathy: -Continue gabapentin 300 mg 3 times daily.  3.  Constipation: -Continue Colace 200 mg at bedtime and lactulose as needed.  4.  Hypertension: -Continue Norvasc 5 mg daily.   Orders placed this encounter:  No orders of the defined types were placed in this encounter.    Derek Jack, MD [place] [phone]   I, Milinda Antis, am acting as a scribe for Dr. Sanda Linger.  I, Derek Jack MD, have reviewed the above documentation for accuracy and completeness, and I agree with the above.

## 2020-03-14 NOTE — Patient Instructions (Signed)
[facility] Discharge Instructions for Patients Receiving Chemotherapy  Today you received the following chemotherapy agents   To help prevent nausea and vomiting after your treatment, we encourage you to take your nausea medication   If you develop nausea and vomiting that is not controlled by your nausea medication, call the clinic.   BELOW ARE SYMPTOMS THAT SHOULD BE REPORTED IMMEDIATELY:  *FEVER GREATER THAN 100.5 F  *CHILLS WITH OR WITHOUT FEVER  NAUSEA AND VOMITING THAT IS NOT CONTROLLED WITH YOUR NAUSEA MEDICATION  *UNUSUAL SHORTNESS OF BREATH  *UNUSUAL BRUISING OR BLEEDING  TENDERNESS IN MOUTH AND THROAT WITH OR WITHOUT PRESENCE OF ULCERS  *URINARY PROBLEMS  *BOWEL PROBLEMS  UNUSUAL RASH Items with * indicate a potential emergency and should be followed up as soon as possible.  Feel free to call the clinic should you have any questions or concerns. The clinic phone number is [phone].  Please show the CHEMO ALERT CARD at check-in to the Emergency Department and triage nurse.   

## 2020-03-14 NOTE — Progress Notes (Signed)
Patient has been assessed, vital signs and labs have been reviewed by Dr. Katragadda. ANC, Creatinine, LFTs, and Platelets are within treatment parameters per Dr. Katragadda. The patient is good to proceed with treatment at this time.  

## 2020-03-14 NOTE — Patient Instructions (Signed)
[place] at [facility] Discharge Instructions  You were seen today by Dr. Delton Coombes. He went over your recent results. Please continue your routine care with your primary care provider. Dr. Delton Coombes will see you back in for labs and follow up.   Thank you for choosing [place] at [facility] to provide your oncology and hematology care.  To afford each patient quality time with our provider, please arrive at least 15 minutes before your scheduled appointment time.   If you have a lab appointment with the [facility] please come in thru the Main Entrance and check in at the main information desk  You need to re-schedule your appointment should you arrive 10 or more minutes late.  We strive to give you quality time with our providers, and arriving late affects you and other patients whose appointments are after yours.  Also, if you no show three or more times for appointments you may be dismissed from the clinic at the providers discretion.     Again, thank you for choosing [facility]  Our hope is that these requests will decrease the amount of time that you wait before being seen by our physicians.       _____________________________________________________________  Should you have questions after your visit to [facility], please contact our office at [phone] between the hours of 8:00 a.m. and 4:30 p.m.  Voicemails left after 4:00 p.m. will not be returned until the following business day.  For prescription refill requests, have your pharmacy contact our office and allow 72 hours.    Cancer Center Support Programs:   > Cancer Support Group  2nd Tuesday of the month 1pm-2pm, Journey Room

## 2020-03-14 NOTE — Progress Notes (Signed)
Patient presents today for treatment and follow up visit with Dr. Delton Coombes. Labs reviewed by MD. Vital signs are within parameters for treatment. Patient has no complaints today of any significant changes since his last visit.   Message received to proceed with treatment.   Treatment given today per MD orders. Tolerated infusion without adverse affects. Vital signs stable. No complaints at this time. RUN noted on the 5FU screen and verified with patient. Discharged from clinic ambulatory. F/U with [facility] as scheduled.

## 2020-03-16 ENCOUNTER — Inpatient Hospital Stay (HOSPITAL_COMMUNITY): Payer: Medicare HMO

## 2020-03-16 ENCOUNTER — Encounter (HOSPITAL_COMMUNITY): Payer: Self-pay

## 2020-03-16 VITALS — BP 127/80 | HR 81 | Temp 97.8°F | Resp 18

## 2020-03-16 DIAGNOSIS — Z95828 Presence of other vascular implants and grafts: Secondary | ICD-10-CM

## 2020-03-16 DIAGNOSIS — Z5111 Encounter for antineoplastic chemotherapy: Secondary | ICD-10-CM | POA: Diagnosis not present

## 2020-03-16 DIAGNOSIS — C259 Malignant neoplasm of pancreas, unspecified: Secondary | ICD-10-CM

## 2020-03-16 MED ORDER — SODIUM CHLORIDE 0.9% FLUSH
10.0000 mL | INTRAVENOUS | Status: DC | PRN
  Administered 2020-03-16: 10 mL

## 2020-03-16 MED ORDER — HEPARIN SOD (PORK) LOCK FLUSH 100 UNIT/ML IV SOLN
500.0000 [IU] | Freq: Once | INTRAVENOUS | Status: AC | PRN
  Administered 2020-03-16: 500 [IU]

## 2020-03-16 NOTE — Patient Instructions (Signed)
[place] at [facility] Discharge Instructions  5FU pump discontinued with portacath flushed easily per protocol. Follow-up as scheduled  Thank you for choosing [place] at [facility] to provide your oncology and hematology care.  To afford each patient quality time with our provider, please arrive at least 15 minutes before your scheduled appointment time.   If you have a lab appointment with the [facility] please come in thru the Main Entrance and check in at the main information desk.  You need to re-schedule your appointment should you arrive 10 or more minutes late.  We strive to give you quality time with our providers, and arriving late affects you and other patients whose appointments are after yours.  Also, if you no show three or more times for appointments you may be dismissed from the clinic at the providers discretion.     Again, thank you for choosing [facility].  Our hope is that these requests will decrease the amount of time that you wait before being seen by our physicians.       _____________________________________________________________  Should you have questions after your visit to [facility], please contact our office at [phone] between the hours of 8:00 a.m. and 4:30 p.m.  Voicemails left after 4:00 p.m. will not be returned until the following business day.  For prescription refill requests, have your pharmacy contact our office and allow 72 hours.    Due to Covid, you will need to wear a mask upon entering the hospital. If you do not have a mask, a mask will be given to you at the Main Entrance upon arrival. For doctor visits, patients may have 1 support person with them. For treatment visits, patients can not have anyone with them due to social distancing guidelines and our immunocompromised population.

## 2020-03-16 NOTE — Progress Notes (Signed)
Doris [name] tolerated 5FU pump well without complaints or incident. 5FU pump discontinued with portacath flushed easily per protocol then de-accessed. VSS Pt discharged self ambulatory in satisfactory condition

## 2020-03-28 ENCOUNTER — Inpatient Hospital Stay (HOSPITAL_COMMUNITY): Payer: Medicare HMO

## 2020-03-28 ENCOUNTER — Other Ambulatory Visit (HOSPITAL_COMMUNITY): Payer: Medicare HMO

## 2020-03-28 ENCOUNTER — Ambulatory Visit (HOSPITAL_COMMUNITY): Payer: Medicare HMO | Admitting: Nurse Practitioner

## 2020-03-30 ENCOUNTER — Encounter (HOSPITAL_COMMUNITY): Payer: Medicare HMO

## 2020-04-11 ENCOUNTER — Other Ambulatory Visit: Payer: Self-pay

## 2020-04-11 ENCOUNTER — Inpatient Hospital Stay (HOSPITAL_COMMUNITY): Payer: Medicare HMO | Attending: Hematology

## 2020-04-11 ENCOUNTER — Inpatient Hospital Stay (HOSPITAL_COMMUNITY): Payer: Medicare HMO

## 2020-04-11 ENCOUNTER — Inpatient Hospital Stay (HOSPITAL_BASED_OUTPATIENT_CLINIC_OR_DEPARTMENT_OTHER): Payer: Medicare HMO | Admitting: Hematology

## 2020-04-11 ENCOUNTER — Other Ambulatory Visit (HOSPITAL_COMMUNITY): Payer: Self-pay | Admitting: Hematology

## 2020-04-11 VITALS — BP 138/78 | HR 90 | Temp 97.0°F | Resp 18 | Wt 234.2 lb

## 2020-04-11 DIAGNOSIS — Z8249 Family history of ischemic heart disease and other diseases of the circulatory system: Secondary | ICD-10-CM | POA: Diagnosis not present

## 2020-04-11 DIAGNOSIS — Z5111 Encounter for antineoplastic chemotherapy: Secondary | ICD-10-CM | POA: Insufficient documentation

## 2020-04-11 DIAGNOSIS — R531 Weakness: Secondary | ICD-10-CM | POA: Insufficient documentation

## 2020-04-11 DIAGNOSIS — Z801 Family history of malignant neoplasm of trachea, bronchus and lung: Secondary | ICD-10-CM | POA: Diagnosis not present

## 2020-04-11 DIAGNOSIS — C7800 Secondary malignant neoplasm of unspecified lung: Secondary | ICD-10-CM | POA: Insufficient documentation

## 2020-04-11 DIAGNOSIS — C252 Malignant neoplasm of tail of pancreas: Secondary | ICD-10-CM | POA: Diagnosis present

## 2020-04-11 DIAGNOSIS — Z79899 Other long term (current) drug therapy: Secondary | ICD-10-CM | POA: Insufficient documentation

## 2020-04-11 DIAGNOSIS — C259 Malignant neoplasm of pancreas, unspecified: Secondary | ICD-10-CM

## 2020-04-11 DIAGNOSIS — C787 Secondary malignant neoplasm of liver and intrahepatic bile duct: Secondary | ICD-10-CM

## 2020-04-11 DIAGNOSIS — F17210 Nicotine dependence, cigarettes, uncomplicated: Secondary | ICD-10-CM | POA: Insufficient documentation

## 2020-04-11 DIAGNOSIS — K5900 Constipation, unspecified: Secondary | ICD-10-CM | POA: Insufficient documentation

## 2020-04-11 DIAGNOSIS — E119 Type 2 diabetes mellitus without complications: Secondary | ICD-10-CM | POA: Insufficient documentation

## 2020-04-11 DIAGNOSIS — Z8042 Family history of malignant neoplasm of prostate: Secondary | ICD-10-CM | POA: Diagnosis not present

## 2020-04-11 DIAGNOSIS — I10 Essential (primary) hypertension: Secondary | ICD-10-CM | POA: Diagnosis not present

## 2020-04-11 DIAGNOSIS — Z833 Family history of diabetes mellitus: Secondary | ICD-10-CM | POA: Diagnosis not present

## 2020-04-11 DIAGNOSIS — Z794 Long term (current) use of insulin: Secondary | ICD-10-CM | POA: Diagnosis not present

## 2020-04-11 DIAGNOSIS — Z800 Family history of malignant neoplasm of digestive organs: Secondary | ICD-10-CM | POA: Diagnosis not present

## 2020-04-11 LAB — COMPREHENSIVE METABOLIC PANEL
ALT: 23 U/L (ref 0–44)
AST: 22 U/L (ref 15–41)
Albumin: 3.8 g/dL (ref 3.5–5.0)
Alkaline Phosphatase: 88 U/L (ref 38–126)
Anion gap: 12 (ref 5–15)
BUN: 9 mg/dL (ref 6–20)
CO2: 26 mmol/L (ref 22–32)
Calcium: 9.1 mg/dL (ref 8.9–10.3)
Chloride: 95 mmol/L — ABNORMAL LOW (ref 98–111)
Creatinine, Ser: 0.67 mg/dL (ref 0.44–1.00)
GFR calc Af Amer: >60 mL/min (ref >60)
GFR calc non Af Amer: >60 mL/min (ref >60)
Glucose, Bld: 305 mg/dL — ABNORMAL HIGH (ref 70–99)
Potassium: 3.6 mmol/L (ref 3.5–5.1)
Sodium: 133 mmol/L — ABNORMAL LOW (ref 135–145)
Total Bilirubin: 0.5 mg/dL (ref 0.3–1.2)
Total Protein: 6.8 g/dL (ref 6.5–8.1)

## 2020-04-11 LAB — CBC WITH DIFFERENTIAL/PLATELET
Abs Immature Granulocytes: 0.01 10*3/uL (ref 0.00–0.07)
Basophils Absolute: 0 10*3/uL (ref 0.0–0.1)
Basophils Relative: 0 %
Eosinophils Absolute: 0.1 10*3/uL (ref 0.0–0.5)
Eosinophils Relative: 1 %
HCT: 42.9 % (ref 36.0–46.0)
Hemoglobin: 13.3 g/dL (ref 12.0–15.0)
Immature Granulocytes: 0 %
Lymphocytes Relative: 27 %
Lymphs Abs: 1.4 10*3/uL (ref 0.7–4.0)
MCH: 24.4 pg — ABNORMAL LOW (ref 26.0–34.0)
MCHC: 31 g/dL (ref 30.0–36.0)
MCV: 78.6 fL — ABNORMAL LOW (ref 80.0–100.0)
Monocytes Absolute: 0.4 10*3/uL (ref 0.1–1.0)
Monocytes Relative: 8 %
Neutro Abs: 3.5 10*3/uL (ref 1.7–7.7)
Neutrophils Relative %: 64 %
Platelets: 226 10*3/uL (ref 150–400)
RBC: 5.46 MIL/uL — ABNORMAL HIGH (ref 3.87–5.11)
RDW: 18.3 % — ABNORMAL HIGH (ref 11.5–15.5)
WBC: 5.4 10*3/uL (ref 4.0–10.5)
nRBC: 0 % (ref 0.0–0.2)

## 2020-04-11 LAB — MAGNESIUM: Magnesium: 1.7 mg/dL (ref 1.7–2.4)

## 2020-04-11 MED ORDER — HEPARIN SOD (PORK) LOCK FLUSH 100 UNIT/ML IV SOLN
500.0000 [IU] | Freq: Once | INTRAVENOUS | Status: AC
  Administered 2020-04-11: 500 [IU] via INTRAVENOUS

## 2020-04-11 MED ORDER — SODIUM CHLORIDE 0.9 % IV SOLN
Freq: Once | INTRAVENOUS | Status: AC
  Filled 2020-04-11: qty 10

## 2020-04-11 MED ORDER — ONDANSETRON HCL 4 MG/2ML IJ SOLN
INTRAMUSCULAR | Status: AC
  Filled 2020-04-11: qty 4

## 2020-04-11 MED ORDER — ONDANSETRON HCL 4 MG/2ML IJ SOLN
8.0000 mg | Freq: Once | INTRAMUSCULAR | Status: AC
  Administered 2020-04-11: 8 mg via INTRAVENOUS

## 2020-04-11 NOTE — Progress Notes (Signed)
[place], [place] [address]   CLINIC:  Medical Oncology/Hematology  PCP:  Merdis Delay, DO [address] / [place] [address] [phone]   REASON FOR VISIT:  Follow-up for metastatic pancreatic cancer to the liver  PRIOR THERAPY: Gemcitabine and Abraxane x 8 cycles from 05/17/2019 to 12/28/2019  CURRENT THERAPY: FOLFIRI and Aloxi  BRIEF ONCOLOGIC HISTORY:  Oncology History  Pancreatic cancer metastasized to liver [facility])  05/09/2019 Initial Diagnosis   Pancreatic cancer metastasized to liver [facility])   05/17/2019 - 02/08/2020 Chemotherapy   The patient had PACLitaxel-protein bound (ABRAXANE) chemo infusion 275 mg, 125 mg/m2 = 275 mg, Intravenous,  Once, 8 of 9 cycles Dose modification: 100 mg/m2 (80 % of original dose 125 mg/m2, Cycle 1, Reason: Other (see comments), Comment: neutropenia), 93.75 mg/m2 (75 % of original dose 125 mg/m2, Cycle 1, Reason: Other (see comments), Comment: neutropenia), 100 mg/m2 (80 % of original dose 125 mg/m2, Cycle 2, Reason: Other (see comments), Comment: cytopenias with previous treatment), 75 mg/m2 (original dose 125 mg/m2, Cycle 3, Reason: Provider Judgment, Comment: neuropathy stated as 10/10 dose reduce required per NP Reynolds Bowl) Administration: 275 mg (05/17/2019), 225 mg (05/24/2019), 200 mg (05/31/2019), 225 mg (06/14/2019), 225 mg (06/28/2019), 225 mg (07/12/2019), 175 mg (07/19/2019), 175 mg (08/16/2019), 175 mg (08/31/2019), 175 mg (09/27/2019), 175 mg (10/11/2019), 175 mg (10/25/2019), 175 mg (11/08/2019), 175 mg (11/23/2019), 175 mg (12/08/2019), 175 mg (12/28/2019), 175 mg (01/19/2020) ondansetron (ZOFRAN) 8 mg in sodium chloride 0.9 % 50 mL IVPB, 8 mg (100 % of original dose 8 mg), Intravenous,  Once, 1 of 1 cycle Dose modification: 8 mg (original dose 8 mg, Cycle 1) gemcitabine (GEMZAR) 2,200 mg in sodium chloride 0.9 % 250 mL chemo infusion, 2,204 mg, Intravenous,  Once, 8 of 9 cycles Dose modification: 750 mg/m2  (75 % of original dose 1,000 mg/m2, Cycle 1, Reason: Other (see comments), Comment: neutropenia), 750 mg/m2 (75 % of original dose 1,000 mg/m2, Cycle 1, Reason: Other (see comments), Comment: neutropenia), 800 mg/m2 (80 % of original dose 1,000 mg/m2, Cycle 2, Reason: Other (see comments), Comment: cytopenia) Administration: 2,200 mg (05/17/2019), 1,600 mg (05/24/2019), 1,600 mg (05/31/2019), 1,786 mg (06/14/2019), 1,786 mg (06/28/2019), 1,786 mg (07/12/2019), 1,786 mg (07/19/2019), 1,786 mg (08/16/2019), 1,786 mg (08/31/2019), 1,786 mg (09/27/2019), 1,786 mg (10/11/2019), 1,786 mg (10/25/2019), 1,786 mg (11/08/2019), 1,786 mg (11/23/2019), 1,786 mg (12/08/2019), 1,786 mg (12/28/2019), 1,786 mg (01/19/2020) ondansetron (ZOFRAN) 8 mg, dexamethasone (DECADRON) 10 mg in sodium chloride 0.9 % 50 mL IVPB, , Intravenous,  Once, 8 of 9 cycles Administration:  (05/17/2019),  (05/24/2019),  (05/31/2019),  (06/14/2019),  (06/28/2019),  (07/12/2019),  (07/19/2019),  (08/16/2019),  (08/31/2019),  (09/27/2019),  (10/11/2019),  (10/25/2019),  (11/08/2019),  (11/23/2019),  (12/08/2019),  (12/28/2019),  (01/19/2020)  for chemotherapy treatment.    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  The Common Hereditary Gene Panel offered by [facility] includes sequencing and/or deletion duplication testing of the following 48 genes: APC, ATM, AXIN2, BARD1, BMPR1A, BRCA1, BRCA2, BRIP1, CDH1, CDK4, CDKN2A (p14ARF), CDKN2A (p16INK4a), CHEK2, CTNNA1, DICER1, EPCAM (Deletion/duplication testing only), GREM1 (promoter region deletion/duplication testing only), KIT, MEN1, MLH1, MSH2, MSH3, MSH6, MUTYH, NBN, NF1, NHTL1, PALB2, PDGFRA, PMS2, POLD1, POLE, PTEN, RAD50, RAD51C, RAD51D, RNF43, SDHB, SDHC, SDHD, SMAD4, SMARCA4. STK11, TP53, TSC1, TSC2, and VHL.  The following genes were evaluated for sequence changes only: SDHA and HOXB13 c.251G>A variant only. The report date is August 02, 2019.     02/15/2020 -  Chemotherapy   The patient had palonosetron (ALOXI) injection 0.25 mg, 0.25 mg, Intravenous,  Once, 3 of 6 cycles Administration: 0.25 mg (02/15/2020), 0.25 mg (02/29/2020), 0.25 mg (03/14/2020) fluorouracil (ADRUCIL) 4,500 mg in sodium chloride 0.9 % 60 mL chemo infusion, 1,920 mg/m2 = 4,500 mg (80 % of original dose 2,400 mg/m2), Intravenous, 1 Day/Dose, 3 of 6 cycles Dose modification: 1,920 mg/m2 (80 % of original dose 2,400 mg/m2, Cycle 1, Reason: Provider Judgment), 1,680 mg/m2 (70 % of original dose 2,400 mg/m2, Cycle 2, Reason: Other (see comments), Comment: side effects) Administration: 4,500 mg (02/15/2020), 3,950 mg (02/29/2020), 3,950 mg (03/14/2020) irinotecan LIPOSOME (ONIVYDE) 129 mg in sodium chloride 0.9 % 500 mL chemo infusion, 56 mg/m2 = 129 mg (80 % of original dose 70 mg/m2), Intravenous, Once, 3 of 6 cycles Dose modification: 56 mg/m2 (80 % of original dose 70 mg/m2, Cycle 1, Reason: Provider Judgment), 56 mg/m2 (80 % of original dose 70 mg/m2, Cycle 1, Reason: Provider Judgment), 49 mg/m2 (70 % of original dose 70 mg/m2, Cycle 2, Reason: Other (see comments), Comment: side effects) Administration: 129 mg (02/15/2020), 116.1 mg (02/29/2020), 116.1 mg (03/14/2020) leucovorin 748 mg in sodium chloride 0.9 % 250 mL infusion, 320 mg/m2 = 748 mg (80 % of original dose 400 mg/m2), Intravenous,  Once, 3 of 6 cycles Dose modification: 320 mg/m2 (80 % of original dose 400 mg/m2, Cycle 1, Reason: Provider Judgment), 280 mg/m2 (70 % of original dose 400 mg/m2, Cycle 2, Reason: Other (see comments), Comment: side effects) Administration: 748 mg (02/15/2020), 656 mg (02/29/2020), 656 mg (03/14/2020)  for chemotherapy treatment.      CANCER STAGING: Cancer Staging No matching staging information was found for the patient.  INTERVAL HISTORY:  Ms. Lacoya Wilbanks, a 59 y.o. female, returns for routine follow-up and consideration for next cycle of chemotherapy. Cathie was last seen on  03/14/2020.  Due for cycle #4 of FOLFIRI and Aloxi today.   Today she reports having low energy levels since starting the new regimen. She gets N/V after her chemo treatments. Her appetite has also been bad and she still has numbness in her feet. She reports not having any substantial meals over the past couple days, though she is still taking insulin daily, 20 units TID. She has an inhaler at home, but she has not been using it recently.   REVIEW OF SYSTEMS:  Review of Systems  Constitutional: Positive for appetite change (moderately decreased) and fatigue (moderate).  Gastrointestinal: Positive for vomiting.  Neurological: Positive for numbness (feet).  Psychiatric/Behavioral: Positive for sleep disturbance.  All other systems reviewed and are negative.   PAST MEDICAL/SURGICAL HISTORY:  Past Medical History:  Diagnosis Date  . Diabetes mellitus without complication ([place])   . Family history of prostate cancer   . Family history of stomach cancer   . Hypertension   . Pancreatic cancer ([place])   . Port-A-Cath in place 05/16/2019   Past Surgical History:  Procedure Laterality Date  . ABDOMINAL HYSTERECTOMY    . BACK SURGERY    . KIDNEY SURGERY     per pt, had leakage  . tubal ligation Left     SOCIAL HISTORY:  Social History   Socioeconomic History  . Marital status: Divorced    Spouse name: Not on file  . Number of children: 1  . Years of education: Not on file  . Highest education level: Not on file  Occupational History  . Occupation:  disabled  Tobacco Use  . Smoking status: Current Every Day Smoker    Packs/day: 1.00    Years: 45.00    Pack years: 45.00    Types: Cigarettes  . Smokeless tobacco: Never Used  Vaping Use  . Vaping Use: Never used  Substance and Sexual Activity  . Alcohol use: Not Currently  . Drug use: Never  . Sexual activity: Not on file  Other Topics Concern  . Not on file  Social History Narrative  . Not on file   Social Determinants of  Health   Financial Resource Strain: Low Risk   . Difficulty of Paying Living Expenses: Not hard at all  Food Insecurity: No Food Insecurity  . Worried About Charity fundraiser in the Last Year: Never true  . Ran Out of Food in the Last Year: Never true  Transportation Needs: Unmet Transportation Needs  . Lack of Transportation (Medical): Yes  . Lack of Transportation (Non-Medical): Yes  Physical Activity: Inactive  . Days of Exercise per Week: 0 days  . Minutes of Exercise per Session: 0 min  Stress: No Stress Concern Present  . Feeling of Stress : Not at all  Social Connections: Moderately Isolated  . Frequency of Communication with Friends and Family: More than three times a week  . Frequency of Social Gatherings with Friends and Family: More than three times a week  . Attends Religious Services: 1 to 4 times per year  . Active Member of Clubs or Organizations: No  . Attends Archivist Meetings: Never  . Marital Status: Divorced  Human resources officer Violence: Not At Risk  . Fear of Current or Ex-Partner: No  . Emotionally Abused: No  . Physically Abused: No  . Sexually Abused: No    FAMILY HISTORY:  Family History  Problem Relation Age of Onset  . Diabetes Mother   . Hypertension Mother   . Lung cancer Mother 70       d. 14  . Diabetes Father   . Hypertension Father   . Heart disease Brother   . Stomach cancer Paternal Aunt 20  . Stroke Paternal Grandmother   . Prostate cancer Other        PGMs brother  . Stomach cancer Other        PGMs mother    CURRENT MEDICATIONS:  Current Outpatient Medications  Medication Sig Dispense Refill  . amLODipine (NORVASC) 5 MG tablet Take 1 tablet (5 mg total) by mouth 2 (two) times daily. 90 tablet 3  . docusate sodium (COLACE) 100 MG capsule Take 2 capsules (200 mg total) by mouth at bedtime. 10 capsule 0  . Gemcitabine HCl (GEMZAR IV) Inject into the vein. Days 1, 8, 15 q 28 days    . insulin aspart (NOVOLOG) 100  UNIT/ML injection Inject 20 Units into the skin 3 (three) times daily before meals. 10 mL 3  . Insulin Syringe-Needle U-100 (INSULIN SYRINGE 1CC/31GX5/16") 31G X 5/16" 1 ML MISC     . Lactulose 20 GM/30ML SOLN Take 30 mLs (20 g total) by mouth at bedtime as needed. 450 mL 2  . lisinopril (ZESTRIL) 40 MG tablet Take 1 tablet (40 mg total) by mouth daily. 90 tablet 3  . NEEDLE, DISP, 30 G (BD DISP NEEDLES) 30G X 1/2" MISC Use as directed with insulin three times daily 100 each 6  . PACLitaxel Protein-Bound Part (ABRAXANE IV) Inject into the vein. Days 1, 8, 15 q 28 days    .  WIXELA INHUB 250-50 MCG/DOSE AEPB INHALE 1 PUFF INTO THE LUNGS TWICE DAILY 60 each 2  . diphenoxylate-atropine (LOMOTIL) 2.5-0.025 MG tablet Take 1 tablet by mouth 4 (four) times daily as needed for diarrhea or loose stools. (Patient not taking: Reported on 04/11/2020) 30 tablet 3  . lidocaine-prilocaine (EMLA) cream  (Patient not taking: Reported on 04/11/2020)    . loperamide (IMODIUM A-D) 2 MG tablet Take 2 at diarrhea onset , then 1 after every watery BM. Please call if not helping (Patient not taking: Reported on 04/11/2020) 100 tablet 1  . ondansetron (ZOFRAN) 4 MG tablet TAKE 1 TABLET(4 MG) BY MOUTH THREE TIMES DAILY (Patient not taking: Reported on 04/11/2020) 60 tablet 3  . oxyCODONE-acetaminophen (PERCOCET) 10-325 MG tablet Take 1 tablet by mouth every 4 (four) hours as needed for pain.  (Patient not taking: Reported on 04/11/2020)     No current facility-administered medications for this visit.    ALLERGIES:  Allergies  Allergen Reactions  . Hydrocodone Hives  . Tramadol Itching    PHYSICAL EXAM:  Performance status (ECOG): 1 - Symptomatic but completely ambulatory  Vitals:   04/11/20 0835  BP: 138/78  Pulse: 90  Resp: 18  Temp: (!) 97 F (36.1 C)  SpO2: 94%   Wt Readings from Last 3 Encounters:  04/11/20 234 lb 3.2 oz (106.2 kg)  03/14/20 235 lb (106.6 kg)  02/29/20 240 lb (108.9 kg)   Physical  Exam Vitals reviewed.  Constitutional:      Appearance: Normal appearance. She is obese.  Cardiovascular:     Rate and Rhythm: Normal rate and regular rhythm.     Pulses: Normal pulses.     Heart sounds: Normal heart sounds.  Pulmonary:     Effort: Pulmonary effort is normal.     Breath sounds: Examination of the right-upper field reveals wheezing. Examination of the left-upper field reveals wheezing. Examination of the right-middle field reveals wheezing. Examination of the left-middle field reveals wheezing. Wheezing present.  Chest:     Comments: Port on right chest Abdominal:     Palpations: Abdomen is soft. There is no mass.     Tenderness: There is no abdominal tenderness.  Neurological:     General: No focal deficit present.     Mental Status: She is alert and oriented to person, place, and time.  Psychiatric:        Mood and Affect: Mood normal.        Behavior: Behavior normal.     LABORATORY DATA:  I have reviewed the labs as listed.  CBC Latest Ref Rng & Units 04/11/2020 03/14/2020 02/29/2020  WBC 4.0 - 10.5 K/uL 5.4 6.6 5.8  Hemoglobin 12.0 - 15.0 g/dL 13.3 13.6 12.6  Hematocrit 36 - 46 % 42.9 44.3 41.5  Platelets 150 - 400 K/uL 226 231 194   CMP Latest Ref Rng & Units 04/11/2020 03/14/2020 02/29/2020  Glucose 70 - 99 mg/dL 305(H) 276(H) 284(H)  BUN 6 - 20 mg/dL _0 Creatinine 0.44 - 1.00 mg/dL 0.67 0.81 0.82  Sodium 135 - 145 mmol/L 133(L) 135 135  Potassium 3.5 - 5.1 mmol/L 3.6 3.9 3.8  Chloride 98 - 111 mmol/L 95(L) 95(L) 96(L)  CO2 22 - 32 mmol/L _1 Calcium 8.9 - 10.3 mg/dL 9.1 9.2 9.1  Total Protein 6.5 - 8.1 g/dL 6.8 6.9 6.5  Total Bilirubin 0.3 - 1.2 mg/dL 0.5 0.4 0.4  Alkaline Phos 38 - 126 U/L 88 76 82  AST 15 - 41 U/L _0 ALT 0 - 44 U/L _1 Lab Results  Component Value Date   LDH 166 02/09/2020    DIAGNOSTIC IMAGING:  I have independently reviewed the scans and discussed with the patient. No results found.   ASSESSMENT:   1. Metastatic pancreatic cancer to the liverand lungs, MSI stable: -8 cycles of gemcitabine and Abraxane from 05/17/2019 through 12/28/2019 with progression. -CT CAP on 02/07/2020 showed subcentimeter new pulmonary nodules. Pancreatic tail mass measured 5.5 x 3.7 cm (3.2 x 1.9 cm). Hepatic steatosis with lobular contours. Low-density lesion in the posterior right hemiliver 11 mm, previously 9 mm. Some nodularity in the sigmoid mesocolon and along the right hepatic margin just above the gallbladder fossa. -5-FU and Onivyde started on 02/15/2019 with 20% dose reduction.   PLAN:  1. Metastatic pancreatic cancer to the liver and lungs: -She had cycle 3 on 03/14/2020.  She did not feel better after that.  Last few days she has been feeling better however today she is weak. -I will hold her treatment today.  I will reevaluate her next week. -She is feeling very weak, I will send for UG T1 A1 test for CPT-11 toxicity. -She will receive hydration today.  2. Peripheral neuropathy: -Continue gabapentin 2-3 times a day.  3. Constipation: -Continue Colace at bedtime and lactulose as needed.  4. Hypertension: -Continue Norvasc 5 mg daily.  5.  Diabetes: -She is NovoLog 20 units 3 times a day.  She is not eating well.  She reports intense sweating episodes at home. -I have recommended evaluation by Dr. Dorris Fetch as soon as possible for better glycemic control.   Orders placed this encounter:  No orders of the defined types were placed in this encounter.    Derek Jack, MD [place] [phone]   I, Milinda Antis, am acting as a scribe for Dr. Sanda Linger.  I, Derek Jack MD, have reviewed the above documentation for accuracy and completeness, and I agree with the above.

## 2020-04-11 NOTE — Progress Notes (Signed)
No tx today per MD.  Will defer tx 1 week and give IVF with electrolytes today.   Pt c/o nausea and is dry heaving.  Order rec'd from Cristela Felt, NP to give Zofran 8 mg IV x 1 dose.

## 2020-04-11 NOTE — Patient Instructions (Signed)
[place] at [facility] Discharge Instructions  You were seen today by Dr. Delton Coombes. He went over your recent results. Cut down your dose of insulin to half of your usual dose on days when you do not eat. Take 1 tablet of ondansetron for your nausea every morning. You will be referred to an endocrinologist for your blood sugar control. You did not receive your treatment today and only received fluids. Dr. Delton Coombes will see you back in 1 week for labs and follow up.   Thank you for choosing [place] at [facility] to provide your oncology and hematology care.  To afford each patient quality time with our provider, please arrive at least 15 minutes before your scheduled appointment time.   If you have a lab appointment with the [facility] please come in thru the Main Entrance and check in at the main information desk  You need to re-schedule your appointment should you arrive 10 or more minutes late.  We strive to give you quality time with our providers, and arriving late affects you and other patients whose appointments are after yours.  Also, if you no show three or more times for appointments you may be dismissed from the clinic at the providers discretion.     Again, thank you for choosing [facility].  Our hope is that these requests will decrease the amount of time that you wait before being seen by our physicians.       _____________________________________________________________  Should you have questions after your visit to [facility], please contact our office at [phone] between the hours of 8:00 a.m. and 4:30 p.m.  Voicemails left after 4:00 p.m. will not be returned until the following business day.  For prescription refill requests, have your pharmacy contact our office and allow 72 hours.    Cancer Center Support Programs:   > Cancer Support Group  2nd Tuesday of the month 1pm-2pm, Journey Room

## 2020-04-13 ENCOUNTER — Encounter (HOSPITAL_COMMUNITY): Payer: Medicare HMO

## 2020-04-17 LAB — MISC LABCORP TEST (SEND OUT): Labcorp test code: 511200

## 2020-04-18 ENCOUNTER — Other Ambulatory Visit (HOSPITAL_COMMUNITY): Payer: Medicare HMO

## 2020-04-18 ENCOUNTER — Ambulatory Visit (HOSPITAL_COMMUNITY): Payer: Medicare HMO | Admitting: Hematology

## 2020-04-18 ENCOUNTER — Ambulatory Visit (HOSPITAL_COMMUNITY): Payer: Medicare HMO

## 2020-04-19 ENCOUNTER — Ambulatory Visit: Payer: Medicare HMO | Admitting: "Endocrinology

## 2020-04-20 ENCOUNTER — Encounter (HOSPITAL_COMMUNITY): Payer: Self-pay

## 2020-04-20 ENCOUNTER — Encounter (HOSPITAL_COMMUNITY): Payer: Medicare HMO

## 2020-04-20 NOTE — Progress Notes (Signed)
Nutrition  Patient on RD's schedule at 1pm today.  No show in the clinic.  Called and spoke with female and said patient was not there at the time.  Left name and call back number.  Manraj Yeo B. Zenia Resides, [place], [place] Registered Dietitian [phone] (mobile)

## 2020-04-25 ENCOUNTER — Inpatient Hospital Stay (HOSPITAL_COMMUNITY): Payer: Medicare HMO

## 2020-04-25 ENCOUNTER — Inpatient Hospital Stay (HOSPITAL_BASED_OUTPATIENT_CLINIC_OR_DEPARTMENT_OTHER): Payer: Medicare HMO | Admitting: Hematology

## 2020-04-25 ENCOUNTER — Other Ambulatory Visit: Payer: Self-pay

## 2020-04-25 VITALS — BP 140/59 | HR 88 | Temp 97.3°F | Resp 18

## 2020-04-25 VITALS — BP 134/73 | HR 102 | Temp 97.3°F | Resp 18 | Wt 234.5 lb

## 2020-04-25 DIAGNOSIS — C259 Malignant neoplasm of pancreas, unspecified: Secondary | ICD-10-CM

## 2020-04-25 DIAGNOSIS — C787 Secondary malignant neoplasm of liver and intrahepatic bile duct: Secondary | ICD-10-CM

## 2020-04-25 DIAGNOSIS — Z95828 Presence of other vascular implants and grafts: Secondary | ICD-10-CM

## 2020-04-25 DIAGNOSIS — Z5111 Encounter for antineoplastic chemotherapy: Secondary | ICD-10-CM | POA: Diagnosis not present

## 2020-04-25 LAB — COMPREHENSIVE METABOLIC PANEL
ALT: 20 U/L (ref 0–44)
AST: 16 U/L (ref 15–41)
Albumin: 3.5 g/dL (ref 3.5–5.0)
Alkaline Phosphatase: 94 U/L (ref 38–126)
Anion gap: 9 (ref 5–15)
BUN: 7 mg/dL (ref 6–20)
CO2: 27 mmol/L (ref 22–32)
Calcium: 8.9 mg/dL (ref 8.9–10.3)
Chloride: 99 mmol/L (ref 98–111)
Creatinine, Ser: 0.59 mg/dL (ref 0.44–1.00)
GFR calc Af Amer: >60 mL/min (ref >60)
GFR calc non Af Amer: >60 mL/min (ref >60)
Glucose, Bld: 310 mg/dL — ABNORMAL HIGH (ref 70–99)
Potassium: 3.6 mmol/L (ref 3.5–5.1)
Sodium: 135 mmol/L (ref 135–145)
Total Bilirubin: 0.3 mg/dL (ref 0.3–1.2)
Total Protein: 6.3 g/dL — ABNORMAL LOW (ref 6.5–8.1)

## 2020-04-25 LAB — CBC WITH DIFFERENTIAL/PLATELET
Abs Immature Granulocytes: 0.01 10*3/uL (ref 0.00–0.07)
Basophils Absolute: 0 10*3/uL (ref 0.0–0.1)
Basophils Relative: 0 %
Eosinophils Absolute: 0.2 10*3/uL (ref 0.0–0.5)
Eosinophils Relative: 3 %
HCT: 43.2 % (ref 36.0–46.0)
Hemoglobin: 13.3 g/dL (ref 12.0–15.0)
Immature Granulocytes: 0 %
Lymphocytes Relative: 29 %
Lymphs Abs: 1.8 10*3/uL (ref 0.7–4.0)
MCH: 24.3 pg — ABNORMAL LOW (ref 26.0–34.0)
MCHC: 30.8 g/dL (ref 30.0–36.0)
MCV: 79 fL — ABNORMAL LOW (ref 80.0–100.0)
Monocytes Absolute: 0.6 10*3/uL (ref 0.1–1.0)
Monocytes Relative: 9 %
Neutro Abs: 3.6 10*3/uL (ref 1.7–7.7)
Neutrophils Relative %: 59 %
Platelets: 189 10*3/uL (ref 150–400)
RBC: 5.47 MIL/uL — ABNORMAL HIGH (ref 3.87–5.11)
RDW: 18.6 % — ABNORMAL HIGH (ref 11.5–15.5)
WBC: 6.2 10*3/uL (ref 4.0–10.5)
nRBC: 0 % (ref 0.0–0.2)

## 2020-04-25 LAB — MAGNESIUM: Magnesium: 1.7 mg/dL (ref 1.7–2.4)

## 2020-04-25 MED ORDER — SODIUM CHLORIDE 0.9% FLUSH
10.0000 mL | INTRAVENOUS | Status: DC | PRN
  Administered 2020-04-25: 10 mL

## 2020-04-25 MED ORDER — SODIUM CHLORIDE 0.9 % IV SOLN: Freq: Once | INTRAVENOUS | Status: AC

## 2020-04-25 MED ORDER — ATROPINE SULFATE 1 MG/ML IJ SOLN
INTRAMUSCULAR | Status: AC
  Filled 2020-04-25: qty 1

## 2020-04-25 MED ORDER — SODIUM CHLORIDE 0.9 % IV SOLN
46.6667 mg/m2 | Freq: Once | INTRAVENOUS | Status: AC
  Administered 2020-04-25: 107.5 mg via INTRAVENOUS
  Filled 2020-04-25: qty 25

## 2020-04-25 MED ORDER — PALONOSETRON HCL INJECTION 0.25 MG/5ML
0.2500 mg | Freq: Once | INTRAVENOUS | Status: AC
  Administered 2020-04-25: 0.25 mg via INTRAVENOUS

## 2020-04-25 MED ORDER — PALONOSETRON HCL INJECTION 0.25 MG/5ML
INTRAVENOUS | Status: AC
  Filled 2020-04-25: qty 5

## 2020-04-25 MED ORDER — SODIUM CHLORIDE 0.9 % IV SOLN
10.0000 mg | Freq: Once | INTRAVENOUS | Status: AC
  Administered 2020-04-25: 10 mg via INTRAVENOUS
  Filled 2020-04-25: qty 1

## 2020-04-25 MED ORDER — ATROPINE SULFATE 1 MG/ML IJ SOLN
1.0000 mg | Freq: Once | INTRAMUSCULAR | Status: AC
  Administered 2020-04-25: 1 mg via INTRAVENOUS

## 2020-04-25 MED ORDER — SODIUM CHLORIDE 0.9 % IV SOLN
266.6667 mg/m2 | Freq: Once | INTRAVENOUS | Status: AC
  Administered 2020-04-25: 624 mg via INTRAVENOUS
  Filled 2020-04-25: qty 31.2

## 2020-04-25 MED ORDER — SODIUM CHLORIDE 0.9 % IV SOLN
1600.0000 mg/m2 | INTRAVENOUS | Status: DC
  Administered 2020-04-25: 3750 mg via INTRAVENOUS
  Filled 2020-04-25: qty 75

## 2020-04-25 NOTE — Patient Instructions (Signed)
[facility] at [facility] Discharge Instructions  You were seen today by Dr. Katragadda. He went over your recent results. Dr. Katragadda will see you back in for labs and follow up.   Thank you for choosing [facility] at [facility] to provide your oncology and hematology care.  To afford each patient quality time with our provider, please arrive at least 15 minutes before your scheduled appointment time.   If you have a lab appointment with the Cancer Center please come in thru the Main Entrance and check in at the main information desk  You need to re-schedule your appointment should you arrive 10 or more minutes late.  We strive to give you quality time with our providers, and arriving late affects you and other patients whose appointments are after yours.  Also, if you no show three or more times for appointments you may be dismissed from the clinic at the providers discretion.     Again, thank you for choosing [facility].  Our hope is that these requests will decrease the amount of time that you wait before being seen by our physicians.       _____________________________________________________________  Should you have questions after your visit to [facility], please contact our office at [phone] between the hours of 8:00 a.m. and 4:30 p.m.  Voicemails left after 4:00 p.m. will not be returned until the following business day.  For prescription refill requests, have your pharmacy contact our office and allow 72 hours.    Cancer Center Support Programs:   > Cancer Support Group  2nd Tuesday of the month 1pm-2pm, Journey Room    

## 2020-04-25 NOTE — Progress Notes (Signed)
[place], [place] [address]   CLINIC:  Medical Oncology/Hematology  PCP:  Merdis Delay, DO [address] / [place] [address] [phone]   REASON FOR VISIT:  Follow-up for metastatic pancreatic cancer to the liver  PRIOR THERAPY: Gemcitabine and Abraxane x 8 cycles from 05/17/2019 to 12/28/2019  CURRENT THERAPY: FOLFIRI and Aloxi  BRIEF ONCOLOGIC HISTORY:  Oncology History  Pancreatic cancer metastasized to liver [facility])  05/09/2019 Initial Diagnosis   Pancreatic cancer metastasized to liver [facility])   05/17/2019 - 02/08/2020 Chemotherapy   The patient had PACLitaxel-protein bound (ABRAXANE) chemo infusion 275 mg, 125 mg/m2 = 275 mg, Intravenous,  Once, 8 of 9 cycles Dose modification: 100 mg/m2 (80 % of original dose 125 mg/m2, Cycle 1, Reason: Other (see comments), Comment: neutropenia), 93.75 mg/m2 (75 % of original dose 125 mg/m2, Cycle 1, Reason: Other (see comments), Comment: neutropenia), 100 mg/m2 (80 % of original dose 125 mg/m2, Cycle 2, Reason: Other (see comments), Comment: cytopenias with previous treatment), 75 mg/m2 (original dose 125 mg/m2, Cycle 3, Reason: Provider Judgment, Comment: neuropathy stated as 10/10 dose reduce required per NP Reynolds Bowl) Administration: 275 mg (05/17/2019), 225 mg (05/24/2019), 200 mg (05/31/2019), 225 mg (06/14/2019), 225 mg (06/28/2019), 225 mg (07/12/2019), 175 mg (07/19/2019), 175 mg (08/16/2019), 175 mg (08/31/2019), 175 mg (09/27/2019), 175 mg (10/11/2019), 175 mg (10/25/2019), 175 mg (11/08/2019), 175 mg (11/23/2019), 175 mg (12/08/2019), 175 mg (12/28/2019), 175 mg (01/19/2020) ondansetron (ZOFRAN) 8 mg in sodium chloride 0.9 % 50 mL IVPB, 8 mg (100 % of original dose 8 mg), Intravenous,  Once, 1 of 1 cycle Dose modification: 8 mg (original dose 8 mg, Cycle 1) gemcitabine (GEMZAR) 2,200 mg in sodium chloride 0.9 % 250 mL chemo infusion, 2,204 mg, Intravenous,  Once, 8 of 9 cycles Dose modification: 750 mg/m2  (75 % of original dose 1,000 mg/m2, Cycle 1, Reason: Other (see comments), Comment: neutropenia), 750 mg/m2 (75 % of original dose 1,000 mg/m2, Cycle 1, Reason: Other (see comments), Comment: neutropenia), 800 mg/m2 (80 % of original dose 1,000 mg/m2, Cycle 2, Reason: Other (see comments), Comment: cytopenia) Administration: 2,200 mg (05/17/2019), 1,600 mg (05/24/2019), 1,600 mg (05/31/2019), 1,786 mg (06/14/2019), 1,786 mg (06/28/2019), 1,786 mg (07/12/2019), 1,786 mg (07/19/2019), 1,786 mg (08/16/2019), 1,786 mg (08/31/2019), 1,786 mg (09/27/2019), 1,786 mg (10/11/2019), 1,786 mg (10/25/2019), 1,786 mg (11/08/2019), 1,786 mg (11/23/2019), 1,786 mg (12/08/2019), 1,786 mg (12/28/2019), 1,786 mg (01/19/2020) ondansetron (ZOFRAN) 8 mg, dexamethasone (DECADRON) 10 mg in sodium chloride 0.9 % 50 mL IVPB, , Intravenous,  Once, 8 of 9 cycles Administration:  (05/17/2019),  (05/24/2019),  (05/31/2019),  (06/14/2019),  (06/28/2019),  (07/12/2019),  (07/19/2019),  (08/16/2019),  (08/31/2019),  (09/27/2019),  (10/11/2019),  (10/25/2019),  (11/08/2019),  (11/23/2019),  (12/08/2019),  (12/28/2019),  (01/19/2020)  for chemotherapy treatment.    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  The Common Hereditary Gene Panel offered by [facility] includes sequencing and/or deletion duplication testing of the following 48 genes: APC, ATM, AXIN2, BARD1, BMPR1A, BRCA1, BRCA2, BRIP1, CDH1, CDK4, CDKN2A (p14ARF), CDKN2A (p16INK4a), CHEK2, CTNNA1, DICER1, EPCAM (Deletion/duplication testing only), GREM1 (promoter region deletion/duplication testing only), KIT, MEN1, MLH1, MSH2, MSH3, MSH6, MUTYH, NBN, NF1, NHTL1, PALB2, PDGFRA, PMS2, POLD1, POLE, PTEN, RAD50, RAD51C, RAD51D, RNF43, SDHB, SDHC, SDHD, SMAD4, SMARCA4. STK11, TP53, TSC1, TSC2, and VHL.  The following genes were evaluated for sequence changes only: SDHA and HOXB13 c.251G>A variant only. The report date is August 02, 2019.     02/15/2020 -  Chemotherapy   The patient had palonosetron (ALOXI) injection 0.25 mg, 0.25 mg, Intravenous,  Once, 3 of 6 cycles Administration: 0.25 mg (02/15/2020), 0.25 mg (02/29/2020), 0.25 mg (03/14/2020) fluorouracil (ADRUCIL) 4,500 mg in sodium chloride 0.9 % 60 mL chemo infusion, 1,920 mg/m2 = 4,500 mg (80 % of original dose 2,400 mg/m2), Intravenous, 1 Day/Dose, 3 of 6 cycles Dose modification: 1,920 mg/m2 (80 % of original dose 2,400 mg/m2, Cycle 1, Reason: Provider Judgment), 1,680 mg/m2 (70 % of original dose 2,400 mg/m2, Cycle 2, Reason: Other (see comments), Comment: side effects) Administration: 4,500 mg (02/15/2020), 3,950 mg (02/29/2020), 3,950 mg (03/14/2020) irinotecan LIPOSOME (ONIVYDE) 129 mg in sodium chloride 0.9 % 500 mL chemo infusion, 56 mg/m2 = 129 mg (80 % of original dose 70 mg/m2), Intravenous, Once, 3 of 6 cycles Dose modification: 56 mg/m2 (80 % of original dose 70 mg/m2, Cycle 1, Reason: Provider Judgment), 56 mg/m2 (80 % of original dose 70 mg/m2, Cycle 1, Reason: Provider Judgment), 49 mg/m2 (70 % of original dose 70 mg/m2, Cycle 2, Reason: Other (see comments), Comment: side effects) Administration: 129 mg (02/15/2020), 116.1 mg (02/29/2020), 116.1 mg (03/14/2020) leucovorin 748 mg in sodium chloride 0.9 % 250 mL infusion, 320 mg/m2 = 748 mg (80 % of original dose 400 mg/m2), Intravenous,  Once, 3 of 6 cycles Dose modification: 320 mg/m2 (80 % of original dose 400 mg/m2, Cycle 1, Reason: Provider Judgment), 280 mg/m2 (70 % of original dose 400 mg/m2, Cycle 2, Reason: Other (see comments), Comment: side effects) Administration: 748 mg (02/15/2020), 656 mg (02/29/2020), 656 mg (03/14/2020)  for chemotherapy treatment.      CANCER STAGING: Cancer Staging No matching staging information was found for the patient.  INTERVAL HISTORY:  [name], a 59 y.o. female, returns for routine follow-up and consideration for next cycle of chemotherapy. Doris [name] was last seen on  04/11/2020.  Due for cycle #5 of FOLFIRI and Aloxi today.   Overall, she tells me she has been feeling ok. She has been having abdominal pain.  Overall, she feels ready for next cycle of chemo today.   When she sticks herself in the afternoon after she eats, she notes that her blood sugars have been: 178, 199   REVIEW OF SYSTEMS:  Review of Systems  Constitutional: Positive for appetite change and fatigue.  HENT:   Positive for mouth sores.   Eyes: Negative.   Respiratory: Positive for shortness of breath.   Cardiovascular: Negative.   Gastrointestinal: Negative.   Endocrine: Negative.   Genitourinary: Negative.    Musculoskeletal: Negative.   Skin: Negative.   Neurological: Positive for numbness.  Hematological: Negative.   Psychiatric/Behavioral: Negative.   All other systems reviewed and are negative.   PAST MEDICAL/SURGICAL HISTORY:  Past Medical History:  Diagnosis Date  . Diabetes mellitus without complication ([place])   . Family history of prostate cancer   . Family history of stomach cancer   . Hypertension   . Pancreatic cancer ([place])   . Port-A-Cath in place 05/16/2019   Past Surgical History:  Procedure Laterality Date  . ABDOMINAL HYSTERECTOMY    . BACK SURGERY    . KIDNEY SURGERY     per pt, had leakage  . tubal ligation Left     SOCIAL HISTORY:  Social History   Socioeconomic History  . Marital status: Divorced    Spouse name: Not on file  . Number of children: 1  . Years of  education: Not on file  . Highest education level: Not on file  Occupational History  . Occupation: disabled  Tobacco Use  . Smoking status: Current Every Day Smoker    Packs/day: 1.00    Years: 45.00    Pack years: 45.00    Types: Cigarettes  . Smokeless tobacco: Never Used  Vaping Use  . Vaping Use: Never used  Substance and Sexual Activity  . Alcohol use: Not Currently  . Drug use: Never  . Sexual activity: Not on file  Other Topics Concern  . Not on file    Social History Narrative  . Not on file   Social Determinants of Health   Financial Resource Strain: Low Risk   . Difficulty of Paying Living Expenses: Not hard at all  Food Insecurity: No Food Insecurity  . Worried About Charity fundraiser in the Last Year: Never true  . Ran Out of Food in the Last Year: Never true  Transportation Needs: Unmet Transportation Needs  . Lack of Transportation (Medical): Yes  . Lack of Transportation (Non-Medical): Yes  Physical Activity: Inactive  . Days of Exercise per Week: 0 days  . Minutes of Exercise per Session: 0 min  Stress: No Stress Concern Present  . Feeling of Stress : Not at all  Social Connections: Moderately Isolated  . Frequency of Communication with Friends and Family: More than three times a week  . Frequency of Social Gatherings with Friends and Family: More than three times a week  . Attends Religious Services: 1 to 4 times per year  . Active Member of Clubs or Organizations: No  . Attends Archivist Meetings: Never  . Marital Status: Divorced  Human resources officer Violence: Not At Risk  . Fear of Current or Ex-Partner: No  . Emotionally Abused: No  . Physically Abused: No  . Sexually Abused: No    FAMILY HISTORY:  Family History  Problem Relation Age of Onset  . Diabetes Mother   . Hypertension Mother   . Lung cancer Mother 2       d. 61  . Diabetes Father   . Hypertension Father   . Heart disease Brother   . Stomach cancer Paternal Aunt 51  . Stroke Paternal Grandmother   . Prostate cancer Other        PGMs brother  . Stomach cancer Other        PGMs mother    CURRENT MEDICATIONS:  Current Outpatient Medications  Medication Sig Dispense Refill  . amLODipine (NORVASC) 5 MG tablet Take 1 tablet (5 mg total) by mouth 2 (two) times daily. 90 tablet 3  . docusate sodium (COLACE) 100 MG capsule Take 2 capsules (200 mg total) by mouth at bedtime. 10 capsule 0  . Gemcitabine HCl (GEMZAR IV) Inject into the  vein. Days 1, 8, 15 q 28 days    . insulin aspart (NOVOLOG) 100 UNIT/ML injection Inject 20 Units into the skin 3 (three) times daily before meals. 10 mL 3  . Insulin Syringe-Needle U-100 (INSULIN SYRINGE 1CC/31GX5/16") 31G X 5/16" 1 ML MISC     . Lactulose 20 GM/30ML SOLN Take 30 mLs (20 g total) by mouth at bedtime as needed. 450 mL 2  . lisinopril (ZESTRIL) 40 MG tablet Take 1 tablet (40 mg total) by mouth daily. 90 tablet 3  . loperamide (IMODIUM A-D) 2 MG tablet Take 2 at diarrhea onset , then 1 after every watery BM. Please call if not helping 100  tablet 1  . NEEDLE, DISP, 30 G (BD DISP NEEDLES) 30G X 1/2" MISC Use as directed with insulin three times daily 100 each 6  . PACLitaxel Protein-Bound Part (ABRAXANE IV) Inject into the vein. Days 1, 8, 15 q 28 days    . WIXELA INHUB 250-50 MCG/DOSE AEPB INHALE 1 PUFF INTO THE LUNGS TWICE DAILY 60 each 2  . diphenoxylate-atropine (LOMOTIL) 2.5-0.025 MG tablet Take 1 tablet by mouth 4 (four) times daily as needed for diarrhea or loose stools. (Patient not taking: Reported on 04/25/2020) 30 tablet 3  . lidocaine-prilocaine (EMLA) cream  (Patient not taking: Reported on 04/25/2020)    . ondansetron (ZOFRAN) 4 MG tablet TAKE 1 TABLET(4 MG) BY MOUTH THREE TIMES DAILY (Patient not taking: Reported on 04/25/2020) 60 tablet 3  . oxyCODONE-acetaminophen (PERCOCET) 10-325 MG tablet Take 1 tablet by mouth every 4 (four) hours as needed for pain.  (Patient not taking: Reported on 04/25/2020)     No current facility-administered medications for this visit.    ALLERGIES:  Allergies  Allergen Reactions  . Hydrocodone Hives  . Tramadol Itching    PHYSICAL EXAM:  Performance status (ECOG): 1 - Symptomatic but completely ambulatory  Vitals:   04/25/20 0836  BP: (!) 134/73  Pulse: 102  Resp: 18  Temp: (!) 97.3 F (36.3 C)  SpO2: 97%   Wt Readings from Last 3 Encounters:  04/25/20 (!) 234 lb 8 oz (106.4 kg)  04/11/20 234 lb 3.2 oz (106.2 kg)  03/14/20  235 lb (106.6 kg)   Physical Exam Vitals and nursing note reviewed.  Constitutional:      Appearance: Normal appearance.  HENT:     Mouth/Throat:     Mouth: Mucous membranes are moist.  Eyes:     Pupils: Pupils are equal, round, and reactive to light.  Cardiovascular:     Rate and Rhythm: Normal rate and regular rhythm.     Pulses: Normal pulses.     Heart sounds: Normal heart sounds.  Pulmonary:     Effort: Pulmonary effort is normal.     Breath sounds: Normal breath sounds.  Abdominal:     Palpations: Abdomen is soft. There is no mass.     Tenderness: There is abdominal tenderness in the right upper quadrant.  Musculoskeletal:     Cervical back: Neck supple.     Right lower leg: No edema.     Left lower leg: No edema.  Neurological:     Mental Status: She is alert and oriented to person, place, and time.  Psychiatric:        Mood and Affect: Mood normal.        Behavior: Behavior normal.     LABORATORY DATA:  I have reviewed the labs as listed.  CBC Latest Ref Rng & Units 04/25/2020 04/11/2020 03/14/2020  WBC 4.0 - 10.5 K/uL 6.2 5.4 6.6  Hemoglobin 12.0 - 15.0 g/dL 13.3 13.3 13.6  Hematocrit 36 - 46 % 43.2 42.9 44.3  Platelets 150 - 400 K/uL 189 226 231   CMP Latest Ref Rng & Units 04/25/2020 04/11/2020 03/14/2020  Glucose 70 - 99 mg/dL 310(H) 305(H) 276(H)  BUN 6 - 20 mg/dL _0 Creatinine 0.44 - 1.00 mg/dL 0.59 0.67 0.81  Sodium 135 - 145 mmol/L 135 133(L) 135  Potassium 3.5 - 5.1 mmol/L 3.6 3.6 3.9  Chloride 98 - 111 mmol/L 99 95(L) 95(L)  CO2 22 - 32 mmol/L _1 Calcium 8.9 - 10.3  mg/dL 8.9 9.1 9.2  Total Protein 6.5 - 8.1 g/dL 6.3(L) 6.8 6.9  Total Bilirubin 0.3 - 1.2 mg/dL 0.3 0.5 0.4  Alkaline Phos 38 - 126 U/L 94 88 76  AST 15 - 41 U/L _0 ALT 0 - 44 U/L _1 DIAGNOSTIC IMAGING:  I have independently reviewed the scans and discussed with the patient. No results found.   ASSESSMENT:  1. Metastatic pancreatic cancer to the liverand  lungs, MSI stable: -8 cycles of gemcitabine and Abraxane from 05/17/2019 through 12/28/2019 with progression. -CT CAP on 02/07/2020 showed subcentimeter new pulmonary nodules. Pancreatic tail mass measured 5.5 x 3.7 cm (3.2 x 1.9 cm). Hepatic steatosis with lobular contours. Low-density lesion in the posterior right hemiliver 11 mm, previously 9 mm. Some nodularity in the sigmoid mesocolon and along the right hepatic margin just above the gallbladder fossa. -5-FU and Onivyde started on 02/15/2019 with 20% dose reduction. -UG T1 A1 test negative.   PLAN:  1. Metastatic pancreatic cancer to the liver and lungs: -Last treatment was on 03/14/2020.  Today she feels better. -Her LFTs look normal.  CBC also shows normal white count and platelet count. -We will proceed with next cycle of chemotherapy.  Dose will be reduced by 33%.  She will be seen back in 2 weeks for follow-up.  2. Peripheral neuropathy: -Continue gabapentin 2-3 times a day.  3. Constipation: -Continue Colace at bedtime and lactulose as needed.  4. Hypertension: -Continue Norvasc 5 mg daily.  5.  Diabetes: -She is taking NovoLog 20 units 3 times a day.  She has canceled appointment with Dr. Dorris Fetch which we made.  Now she has an appointment with him in September. -She reports her blood sugars are staying between 170 and 200.  I have told her to increase NovoLog to 25 units 3 times a day.  She was told to bring a log of her sugars.   Orders placed this encounter:  No orders of the defined types were placed in this encounter.    Derek Jack, MD [facility] [phone]   I, Jacqualyn Posey, am acting as a scribe for Dr. Sanda Linger.  I, Derek Jack MD, have reviewed the above documentation for accuracy and completeness, and I agree with the above.

## 2020-04-25 NOTE — Progress Notes (Signed)
1000 Labs reviewed with and pt seen by Dr. Delton Coombes and pt approved for Onyvide,Leucovorin and 5FU treatment today with slight dose reduction per MD                                                           [name] tolerated chemo tx well without complaints or incident. Pt discharged with 5FU pump infusing without issues. VSS upon discharge. Pt discharged self ambulatory in satisfactory condition

## 2020-04-25 NOTE — Patient Instructions (Signed)
[facility] Discharge Instructions  Labs drawn from portacath today   Thank you for choosing [facility] to provide your oncology and hematology care.  To afford each patient quality time with our provider, please arrive at least 15 minutes before your scheduled appointment time.   If you have a lab appointment with the Cancer Center please come in thru the Main Entrance and check in at the main information desk.  You need to re-schedule your appointment should you arrive 10 or more minutes late.  We strive to give you quality time with our providers, and arriving late affects you and other patients whose appointments are after yours.  Also, if you no show three or more times for appointments you may be dismissed from the clinic at the providers discretion.     Again, thank you for choosing [facility].  Our hope is that these requests will decrease the amount of time that you wait before being seen by our physicians.       _____________________________________________________________  Should you have questions after your visit to [facility], please contact our office at [phone] and follow the prompts.  Our office hours are 8:00 a.m. and 4:30 p.m. Monday - Friday.  Please note that voicemails left after 4:00 p.m. may not be returned until the following business day.  We are closed weekends and major holidays.  You do have access to a nurse 24-7, just call the main number to the clinic [phone] and do not press any options, hold on the line and a nurse will answer the phone.    For prescription refill requests, have your pharmacy contact our office and allow 72 hours.    Due to Covid, you will need to wear a mask upon entering the hospital. If you do not have a mask, a mask will be given to you at the Main Entrance upon arrival. For doctor visits, patients may have 1 support person age 18  or older with them. For treatment visits, patients can not have anyone with them due to social distancing guidelines and our immunocompromised population.     

## 2020-04-25 NOTE — Patient Instructions (Signed)
[facility] Discharge Instructions for Patients Receiving Chemotherapy   Beginning January 23rd 2017 lab work for the [facility] will be done in the  Main lab at [facility] on 1st floor. If you have a lab appointment with the [place] please come in thru the  Main Entrance and check in at the main information desk   Today you received the following chemotherapy agents Onyvide,Leucovorin and 5FU. Follow-up as scheduled  To help prevent nausea and vomiting after your treatment, we encourage you to take your nausea medication   If you develop nausea and vomiting, or diarrhea that is not controlled by your medication, call the clinic.  The clinic phone number is [phone]. Office hours are Monday-Friday 8:30am-5:00pm.  BELOW ARE SYMPTOMS THAT SHOULD BE REPORTED IMMEDIATELY:  *FEVER GREATER THAN 101.0 F  *CHILLS WITH OR WITHOUT FEVER  NAUSEA AND VOMITING THAT IS NOT CONTROLLED WITH YOUR NAUSEA MEDICATION  *UNUSUAL SHORTNESS OF BREATH  *UNUSUAL BRUISING OR BLEEDING  TENDERNESS IN MOUTH AND THROAT WITH OR WITHOUT PRESENCE OF ULCERS  *URINARY PROBLEMS  *BOWEL PROBLEMS  UNUSUAL RASH Items with * indicate a potential emergency and should be followed up as soon as possible. If you have an emergency after office hours please contact your primary care physician or go to the nearest emergency department.  Please call the clinic during office hours if you have any questions or concerns.   You may also contact the Patient Navigator at [phone] should you have any questions or need assistance in obtaining follow up care.      Resources For Cancer Patients and their Caregivers ? American Cancer Society: Can assist with transportation, wigs, general needs, runs Look Good Feel Better.        581-244-6308 ? Cancer Care: Provides financial assistance, online support groups, medication/co-pay assistance.  1-800-813-HOPE 682-645-0920) ? [place] Assists [place] cancer patients and their families through emotional , educational and financial support.  380-703-1640 ? [place] DSS Where to apply for food stamps, Medicaid and utility assistance. (818) 682-3063 ? [facility]: Transportation to medical appointments. 601-195-2195 ? Social Security Administration: May apply for disability if have a Stage IV cancer. 450 067 2772 [phone] ? [employer], Disability and Transit Services: Assists with nutrition, care and transit needs. (631)[phone]

## 2020-04-25 NOTE — Progress Notes (Signed)
Patient has been assessed, vital signs and labs have been reviewed by Dr. Delton Coombes. ANC, Creatinine, LFTs, and Platelets are within treatment parameters, Slight dose reduction today per Dr. Delton Coombes. The patient is good to proceed with treatment at this time.

## 2020-04-27 ENCOUNTER — Inpatient Hospital Stay (HOSPITAL_COMMUNITY): Payer: Medicare HMO

## 2020-04-27 VITALS — BP 136/69 | HR 100 | Temp 96.8°F | Resp 18

## 2020-04-27 DIAGNOSIS — Z5111 Encounter for antineoplastic chemotherapy: Secondary | ICD-10-CM | POA: Diagnosis not present

## 2020-04-27 DIAGNOSIS — Z95828 Presence of other vascular implants and grafts: Secondary | ICD-10-CM

## 2020-04-27 DIAGNOSIS — C259 Malignant neoplasm of pancreas, unspecified: Secondary | ICD-10-CM

## 2020-04-27 MED ORDER — SODIUM CHLORIDE 0.9% FLUSH
10.0000 mL | INTRAVENOUS | Status: DC | PRN
  Administered 2020-04-27: 10 mL

## 2020-04-27 MED ORDER — HEPARIN SOD (PORK) LOCK FLUSH 100 UNIT/ML IV SOLN
500.0000 [IU] | Freq: Once | INTRAVENOUS | Status: AC | PRN
  Administered 2020-04-27: 500 [IU]

## 2020-04-27 NOTE — Patient Instructions (Signed)
[facility] Discharge Instructions for Patients Receiving Chemotherapy  Today you received the following chemotherapy agents   To help prevent nausea and vomiting after your treatment, we encourage you to take your nausea medication   If you develop nausea and vomiting that is not controlled by your nausea medication, call the clinic.   BELOW ARE SYMPTOMS THAT SHOULD BE REPORTED IMMEDIATELY:  *FEVER GREATER THAN 100.5 F  *CHILLS WITH OR WITHOUT FEVER  NAUSEA AND VOMITING THAT IS NOT CONTROLLED WITH YOUR NAUSEA MEDICATION  *UNUSUAL SHORTNESS OF BREATH  *UNUSUAL BRUISING OR BLEEDING  TENDERNESS IN MOUTH AND THROAT WITH OR WITHOUT PRESENCE OF ULCERS  *URINARY PROBLEMS  *BOWEL PROBLEMS  UNUSUAL RASH Items with * indicate a potential emergency and should be followed up as soon as possible.  Feel free to call the clinic should you have any questions or concerns. The clinic phone number is [phone].  Please show the CHEMO ALERT CARD at check-in to the Emergency Department and triage nurse.   

## 2020-04-27 NOTE — Progress Notes (Signed)
Doris [name] presented for Pump d/c and portacath flush.  Clean, Dry and Intact Good blood return present. Portacath flushed with 64ml NS and 500U/72ml Heparin per protocol and needle removed intact. Procedure without incident. Patient tolerated procedure well. Tolerated infusion without adverse affects. Vital signs stable. No complaints at this time. Discharged from clinic ambulatory. F/U with [facility] as scheduled.

## 2020-05-09 ENCOUNTER — Inpatient Hospital Stay (HOSPITAL_COMMUNITY): Payer: Medicare HMO | Attending: Hematology

## 2020-05-09 ENCOUNTER — Inpatient Hospital Stay (HOSPITAL_BASED_OUTPATIENT_CLINIC_OR_DEPARTMENT_OTHER): Payer: Medicare HMO | Admitting: Hematology

## 2020-05-09 ENCOUNTER — Inpatient Hospital Stay (HOSPITAL_COMMUNITY): Payer: Medicare HMO

## 2020-05-09 ENCOUNTER — Other Ambulatory Visit: Payer: Self-pay

## 2020-05-09 VITALS — BP 130/74 | HR 102 | Temp 96.9°F | Resp 18 | Wt 231.6 lb

## 2020-05-09 VITALS — BP 138/68 | HR 80 | Temp 97.2°F | Resp 18

## 2020-05-09 DIAGNOSIS — Z5111 Encounter for antineoplastic chemotherapy: Secondary | ICD-10-CM | POA: Diagnosis present

## 2020-05-09 DIAGNOSIS — Z90710 Acquired absence of both cervix and uterus: Secondary | ICD-10-CM | POA: Diagnosis not present

## 2020-05-09 DIAGNOSIS — F17210 Nicotine dependence, cigarettes, uncomplicated: Secondary | ICD-10-CM | POA: Insufficient documentation

## 2020-05-09 DIAGNOSIS — Z801 Family history of malignant neoplasm of trachea, bronchus and lung: Secondary | ICD-10-CM | POA: Diagnosis not present

## 2020-05-09 DIAGNOSIS — C259 Malignant neoplasm of pancreas, unspecified: Secondary | ICD-10-CM

## 2020-05-09 DIAGNOSIS — C787 Secondary malignant neoplasm of liver and intrahepatic bile duct: Secondary | ICD-10-CM | POA: Diagnosis not present

## 2020-05-09 DIAGNOSIS — I10 Essential (primary) hypertension: Secondary | ICD-10-CM | POA: Diagnosis not present

## 2020-05-09 DIAGNOSIS — Z8042 Family history of malignant neoplasm of prostate: Secondary | ICD-10-CM | POA: Diagnosis not present

## 2020-05-09 DIAGNOSIS — Z8249 Family history of ischemic heart disease and other diseases of the circulatory system: Secondary | ICD-10-CM | POA: Insufficient documentation

## 2020-05-09 DIAGNOSIS — Z79899 Other long term (current) drug therapy: Secondary | ICD-10-CM | POA: Diagnosis not present

## 2020-05-09 DIAGNOSIS — E1142 Type 2 diabetes mellitus with diabetic polyneuropathy: Secondary | ICD-10-CM | POA: Diagnosis not present

## 2020-05-09 DIAGNOSIS — Z833 Family history of diabetes mellitus: Secondary | ICD-10-CM | POA: Insufficient documentation

## 2020-05-09 DIAGNOSIS — Z794 Long term (current) use of insulin: Secondary | ICD-10-CM | POA: Diagnosis not present

## 2020-05-09 DIAGNOSIS — C252 Malignant neoplasm of tail of pancreas: Secondary | ICD-10-CM | POA: Insufficient documentation

## 2020-05-09 DIAGNOSIS — K5900 Constipation, unspecified: Secondary | ICD-10-CM | POA: Diagnosis not present

## 2020-05-09 DIAGNOSIS — Z800 Family history of malignant neoplasm of digestive organs: Secondary | ICD-10-CM | POA: Diagnosis not present

## 2020-05-09 DIAGNOSIS — Z95828 Presence of other vascular implants and grafts: Secondary | ICD-10-CM

## 2020-05-09 LAB — COMPREHENSIVE METABOLIC PANEL
ALT: 15 U/L (ref 0–44)
AST: 17 U/L (ref 15–41)
Albumin: 3.8 g/dL (ref 3.5–5.0)
Alkaline Phosphatase: 74 U/L (ref 38–126)
Anion gap: 10 (ref 5–15)
BUN: 7 mg/dL (ref 6–20)
CO2: 28 mmol/L (ref 22–32)
Calcium: 9 mg/dL (ref 8.9–10.3)
Chloride: 97 mmol/L — ABNORMAL LOW (ref 98–111)
Creatinine, Ser: 0.61 mg/dL (ref 0.44–1.00)
GFR calc Af Amer: >60 mL/min (ref >60)
GFR calc non Af Amer: >60 mL/min (ref >60)
Glucose, Bld: 238 mg/dL — ABNORMAL HIGH (ref 70–99)
Potassium: 3.8 mmol/L (ref 3.5–5.1)
Sodium: 135 mmol/L (ref 135–145)
Total Bilirubin: 0.4 mg/dL (ref 0.3–1.2)
Total Protein: 6.6 g/dL (ref 6.5–8.1)

## 2020-05-09 LAB — CBC WITH DIFFERENTIAL/PLATELET
Abs Immature Granulocytes: 0.02 10*3/uL (ref 0.00–0.07)
Basophils Absolute: 0 10*3/uL (ref 0.0–0.1)
Basophils Relative: 1 %
Eosinophils Absolute: 0.3 10*3/uL (ref 0.0–0.5)
Eosinophils Relative: 4 %
HCT: 42.9 % (ref 36.0–46.0)
Hemoglobin: 13.3 g/dL (ref 12.0–15.0)
Immature Granulocytes: 0 %
Lymphocytes Relative: 29 %
Lymphs Abs: 1.9 10*3/uL (ref 0.7–4.0)
MCH: 24.4 pg — ABNORMAL LOW (ref 26.0–34.0)
MCHC: 31 g/dL (ref 30.0–36.0)
MCV: 78.7 fL — ABNORMAL LOW (ref 80.0–100.0)
Monocytes Absolute: 0.6 10*3/uL (ref 0.1–1.0)
Monocytes Relative: 10 %
Neutro Abs: 3.7 10*3/uL (ref 1.7–7.7)
Neutrophils Relative %: 56 %
Platelets: 258 10*3/uL (ref 150–400)
RBC: 5.45 MIL/uL — ABNORMAL HIGH (ref 3.87–5.11)
RDW: 19 % — ABNORMAL HIGH (ref 11.5–15.5)
WBC: 6.5 10*3/uL (ref 4.0–10.5)
nRBC: 0 % (ref 0.0–0.2)

## 2020-05-09 MED ORDER — FLUOROURACIL CHEMO INJECTION 2.5 GM/50ML
400.0000 mg/m2 | Freq: Once | INTRAVENOUS | Status: AC
  Administered 2020-05-09: 950 mg via INTRAVENOUS
  Filled 2020-05-09: qty 19

## 2020-05-09 MED ORDER — SODIUM CHLORIDE 0.9% FLUSH
10.0000 mL | INTRAVENOUS | Status: DC | PRN
  Administered 2020-05-09: 10 mL

## 2020-05-09 MED ORDER — SODIUM CHLORIDE 0.9 % IV SOLN: Freq: Once | INTRAVENOUS | Status: AC

## 2020-05-09 MED ORDER — PALONOSETRON HCL INJECTION 0.25 MG/5ML
0.2500 mg | Freq: Once | INTRAVENOUS | Status: AC
  Administered 2020-05-09: 0.25 mg via INTRAVENOUS
  Filled 2020-05-09: qty 5

## 2020-05-09 MED ORDER — ATROPINE SULFATE 1 MG/ML IJ SOLN
1.0000 mg | Freq: Once | INTRAMUSCULAR | Status: AC
  Administered 2020-05-09: 1 mg via INTRAVENOUS
  Filled 2020-05-09: qty 1

## 2020-05-09 MED ORDER — SODIUM CHLORIDE 0.9 % IV SOLN
46.6670 mg/m2 | Freq: Once | INTRAVENOUS | Status: AC
  Administered 2020-05-09: 107.5 mg via INTRAVENOUS
  Filled 2020-05-09: qty 25

## 2020-05-09 MED ORDER — SODIUM CHLORIDE 0.9 % IV SOLN
400.0000 mg/m2 | Freq: Once | INTRAVENOUS | Status: AC
  Administered 2020-05-09: 936 mg via INTRAVENOUS
  Filled 2020-05-09: qty 46.8

## 2020-05-09 MED ORDER — HEPARIN SOD (PORK) LOCK FLUSH 100 UNIT/ML IV SOLN
500.0000 [IU] | Freq: Once | INTRAVENOUS | Status: AC | PRN
  Administered 2020-05-09: 500 [IU]

## 2020-05-09 MED ORDER — SODIUM CHLORIDE 0.9 % IV SOLN: 1680.0000 mg/m2 | INTRAVENOUS | Status: DC

## 2020-05-09 MED ORDER — SODIUM CHLORIDE 0.9 % IV SOLN
10.0000 mg | Freq: Once | INTRAVENOUS | Status: AC
  Administered 2020-05-09: 10 mg via INTRAVENOUS
  Filled 2020-05-09: qty 10

## 2020-05-09 NOTE — Progress Notes (Signed)
Patient has been assessed, vital signs and labs have been reviewed by Dr. Katragadda. ANC, Creatinine, LFTs, and Platelets are within treatment parameters per Dr. Katragadda. The patient is good to proceed with treatment at this time.  

## 2020-05-09 NOTE — Progress Notes (Signed)
Doris [name], [place] [id number]   CLINIC:  Medical Oncology/Hematology  PCP:  Merdis Delay, DO [address] / [place] [address] [phone]   REASON FOR VISIT:  Follow-up for metastatic pancreatic cancer to the liver  PRIOR THERAPY: Gemcitabine and abraxane x 8 cycles from 05/17/2019 to 12/28/2019  NGS Results: Not done  CURRENT THERAPY: FOLFIRI and Aloxi  BRIEF ONCOLOGIC HISTORY:  Oncology History  Pancreatic cancer metastasized to liver ([place])  05/09/2019 Initial Diagnosis   Pancreatic cancer metastasized to liver ([place])   05/17/2019 - 02/08/2020 Chemotherapy   The patient had PACLitaxel-protein bound (ABRAXANE) chemo infusion 275 mg, 125 mg/m2 = 275 mg, Intravenous,  Once, 8 of 9 cycles Dose modification: 100 mg/m2 (80 % of original dose 125 mg/m2, Cycle 1, Reason: Other (see comments), Comment: neutropenia), 93.75 mg/m2 (75 % of original dose 125 mg/m2, Cycle 1, Reason: Other (see comments), Comment: neutropenia), 100 mg/m2 (80 % of original dose 125 mg/m2, Cycle 2, Reason: Other (see comments), Comment: cytopenias with previous treatment), 75 mg/m2 (original dose 125 mg/m2, Cycle 3, Reason: Provider Judgment, Comment: neuropathy stated as 10/10 dose reduce required per NP Reynolds Bowl) Administration: 275 mg (05/17/2019), 225 mg (05/24/2019), 200 mg (05/31/2019), 225 mg (06/14/2019), 225 mg (06/28/2019), 225 mg (07/12/2019), 175 mg (07/19/2019), 175 mg (08/16/2019), 175 mg (08/31/2019), 175 mg (09/27/2019), 175 mg (10/11/2019), 175 mg (10/25/2019), 175 mg (11/08/2019), 175 mg (11/23/2019), 175 mg (12/08/2019), 175 mg (12/28/2019), 175 mg (01/19/2020) ondansetron (ZOFRAN) 8 mg in sodium chloride 0.9 % 50 mL IVPB, 8 mg (100 % of original dose 8 mg), Intravenous,  Once, 1 of 1 cycle Dose modification: 8 mg (original dose 8 mg, Cycle 1) gemcitabine (GEMZAR) 2,200 mg in sodium chloride 0.9 % 250 mL chemo infusion, 2,204 mg, Intravenous,  Once, 8 of 9 cycles Dose  modification: 750 mg/m2 (75 % of original dose 1,000 mg/m2, Cycle 1, Reason: Other (see comments), Comment: neutropenia), 750 mg/m2 (75 % of original dose 1,000 mg/m2, Cycle 1, Reason: Other (see comments), Comment: neutropenia), 800 mg/m2 (80 % of original dose 1,000 mg/m2, Cycle 2, Reason: Other (see comments), Comment: cytopenia) Administration: 2,200 mg (05/17/2019), 1,600 mg (05/24/2019), 1,600 mg (05/31/2019), 1,786 mg (06/14/2019), 1,786 mg (06/28/2019), 1,786 mg (07/12/2019), 1,786 mg (07/19/2019), 1,786 mg (08/16/2019), 1,786 mg (08/31/2019), 1,786 mg (09/27/2019), 1,786 mg (10/11/2019), 1,786 mg (10/25/2019), 1,786 mg (11/08/2019), 1,786 mg (11/23/2019), 1,786 mg (12/08/2019), 1,786 mg (12/28/2019), 1,786 mg (01/19/2020) ondansetron (ZOFRAN) 8 mg, dexamethasone (DECADRON) 10 mg in sodium chloride 0.9 % 50 mL IVPB, , Intravenous,  Once, 8 of 9 cycles Administration:  (05/17/2019),  (05/24/2019),  (05/31/2019),  (06/14/2019),  (06/28/2019),  (07/12/2019),  (07/19/2019),  (08/16/2019),  (08/31/2019),  (09/27/2019),  (10/11/2019),  (10/25/2019),  (11/08/2019),  (11/23/2019),  (12/08/2019),  (12/28/2019),  (01/19/2020)  for chemotherapy treatment.    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  The Common Hereditary Gene Panel offered by [facility] includes sequencing and/or deletion duplication testing of the following 48 genes: APC, ATM, AXIN2, BARD1, BMPR1A, BRCA1, BRCA2, BRIP1, CDH1, CDK4, CDKN2A (p14ARF), CDKN2A (p16INK4a), CHEK2, CTNNA1, DICER1, EPCAM (Deletion/duplication testing only), GREM1 (promoter region deletion/duplication testing only), KIT, MEN1, MLH1, MSH2, MSH3, MSH6, MUTYH, NBN, NF1, NHTL1, PALB2, PDGFRA, PMS2, POLD1, POLE, PTEN, RAD50, RAD51C, RAD51D, RNF43, SDHB, SDHC, SDHD, SMAD4, SMARCA4. STK11, TP53, TSC1, TSC2, and VHL.  The following genes were evaluated for sequence changes only: SDHA and HOXB13 c.251G>A variant only.  The report date is  August 02, 2019.   02/15/2020 -  Chemotherapy   The patient had palonosetron (ALOXI) injection 0.25 mg, 0.25 mg, Intravenous,  Once, 4 of 6 cycles Administration: 0.25 mg (02/15/2020), 0.25 mg (02/29/2020), 0.25 mg (03/14/2020), 0.25 mg (04/25/2020) fluorouracil (ADRUCIL) 4,500 mg in sodium chloride 0.9 % 60 mL chemo infusion, 1,920 mg/m2 = 4,500 mg (80 % of original dose 2,400 mg/m2), Intravenous, 1 Day/Dose, 4 of 6 cycles Dose modification: 1,920 mg/m2 (80 % of original dose 2,400 mg/m2, Cycle 1, Reason: Provider Judgment), 1,680 mg/m2 (70 % of original dose 2,400 mg/m2, Cycle 2, Reason: Other (see comments), Comment: side effects), 1,600 mg/m2 (66.7 % of original dose 2,400 mg/m2, Cycle 4, Reason: Provider Judgment) Administration: 4,500 mg (02/15/2020), 3,950 mg (02/29/2020), 3,950 mg (03/14/2020), 3,750 mg (04/25/2020) irinotecan LIPOSOME (ONIVYDE) 129 mg in sodium chloride 0.9 % 500 mL chemo infusion, 56 mg/m2 = 129 mg (80 % of original dose 70 mg/m2), Intravenous, Once, 4 of 6 cycles Dose modification: 56 mg/m2 (80 % of original dose 70 mg/m2, Cycle 1, Reason: Provider Judgment), 56 mg/m2 (80 % of original dose 70 mg/m2, Cycle 1, Reason: Provider Judgment), 49 mg/m2 (70 % of original dose 70 mg/m2, Cycle 2, Reason: Other (see comments), Comment: side effects), 46.6667 mg/m2 (66.7 % of original dose 70 mg/m2, Cycle 4, Reason: Provider Judgment) Administration: 129 mg (02/15/2020), 116.1 mg (02/29/2020), 116.1 mg (03/14/2020), 107.5 mg (04/25/2020) leucovorin 748 mg in sodium chloride 0.9 % 250 mL infusion, 320 mg/m2 = 748 mg (80 % of original dose 400 mg/m2), Intravenous,  Once, 4 of 6 cycles Dose modification: 320 mg/m2 (80 % of original dose 400 mg/m2, Cycle 1, Reason: Provider Judgment), 280 mg/m2 (70 % of original dose 400 mg/m2, Cycle 2, Reason: Other (see comments), Comment: side effects), 546.5035 mg/m2 (66.7 % of original dose 400 mg/m2, Cycle 4, Reason: Provider Judgment) Administration: 748 mg  (02/15/2020), 656 mg (02/29/2020), 656 mg (03/14/2020), 624 mg (04/25/2020)  for chemotherapy treatment.      CANCER STAGING: Cancer Staging No matching staging information was found for the patient.  INTERVAL HISTORY:  [name], a 59 y.o. female, returns for routine follow-up and consideration for next cycle of chemotherapy. Terrilynn was last seen on 04/25/2020.  Due for cycle #5 of FOLFIRI and Aloxi today.   Today she reports having intermittent severe abdominal pain which started 4 days, going from the right side to the left, with 4 episodes of pain daily, which she has never experienced before. She also reports that drinking Boost aggravates the abdominal pain immediately for about 5 minutes after finishing drinking. She is not able to sleep due to the pain. She also is not able to eat for 4 days after the chemo, and on the 5th day is able to tolerate some food. She continues having constipation and forgets to drink lactulose. Her appetite is severely reduced and her energy levels are depleted.  Overall, she feels ready for next cycle of chemo today.    REVIEW OF SYSTEMS:  Review of Systems  Constitutional: Positive for appetite change (severely decreased) and fatigue (depleted).  Gastrointestinal: Positive for abdominal pain (10/10 from R side to L side), constipation and vomiting.  Neurological: Positive for numbness (bottom of feet).  Psychiatric/Behavioral: Positive for sleep disturbance (d/t pain).  All other systems reviewed and are negative.   PAST MEDICAL/SURGICAL HISTORY:  Past Medical History:  Diagnosis Date  . Diabetes mellitus without complication ([place])   . Family history  of prostate cancer   . Family history of stomach cancer   . Hypertension   . Pancreatic cancer ([place])   . Port-A-Cath in place 05/16/2019   Past Surgical History:  Procedure Laterality Date  . ABDOMINAL HYSTERECTOMY    . BACK SURGERY    . KIDNEY SURGERY     per pt, had leakage  . tubal  ligation Left     SOCIAL HISTORY:  Social History   Socioeconomic History  . Marital status: Divorced    Spouse name: Not on file  . Number of children: 1  . Years of education: Not on file  . Highest education level: Not on file  Occupational History  . Occupation: disabled  Tobacco Use  . Smoking status: Current Every Day Smoker    Packs/day: 1.00    Years: 45.00    Pack years: 45.00    Types: Cigarettes  . Smokeless tobacco: Never Used  Vaping Use  . Vaping Use: Never used  Substance and Sexual Activity  . Alcohol use: Not Currently  . Drug use: Never  . Sexual activity: Not on file  Other Topics Concern  . Not on file  Social History Narrative  . Not on file   Social Determinants of Health   Financial Resource Strain:   . Difficulty of Paying Living Expenses:   Food Insecurity:   . Worried About Charity fundraiser in the Last Year:   . Arboriculturist in the Last Year:   Transportation Needs:   . Film/video editor (Medical):   Marland Kitchen Lack of Transportation (Non-Medical):   Physical Activity:   . Days of Exercise per Week:   . Minutes of Exercise per Session:   Stress:   . Feeling of Stress :   Social Connections:   . Frequency of Communication with Friends and Family:   . Frequency of Social Gatherings with Friends and Family:   . Attends Religious Services:   . Active Member of Clubs or Organizations:   . Attends Archivist Meetings:   Marland Kitchen Marital Status:   Intimate Partner Violence:   . Fear of Current or Ex-Partner:   . Emotionally Abused:   Marland Kitchen Physically Abused:   . Sexually Abused:     FAMILY HISTORY:  Family History  Problem Relation Age of Onset  . Diabetes Mother   . Hypertension Mother   . Lung cancer Mother 11       d. 37  . Diabetes Father   . Hypertension Father   . Heart disease Brother   . Stomach cancer Paternal Aunt 1  . Stroke Paternal Grandmother   . Prostate cancer Other        PGMs brother  . Stomach cancer  Other        PGMs mother    CURRENT MEDICATIONS:  Current Outpatient Medications  Medication Sig Dispense Refill  . amLODipine (NORVASC) 5 MG tablet Take 1 tablet (5 mg total) by mouth 2 (two) times daily. 90 tablet 3  . docusate sodium (COLACE) 100 MG capsule Take 2 capsules (200 mg total) by mouth at bedtime. 10 capsule 0  . Gemcitabine HCl (GEMZAR IV) Inject into the vein. Days 1, 8, 15 q 28 days    . insulin aspart (NOVOLOG) 100 UNIT/ML injection Inject 20 Units into the skin 3 (three) times daily before meals. 10 mL 3  . Insulin Syringe-Needle U-100 (INSULIN SYRINGE 1CC/31GX5/16") 31G X 5/16" 1 ML MISC     .  Lactulose 20 GM/30ML SOLN Take 30 mLs (20 g total) by mouth at bedtime as needed. 450 mL 2  . lisinopril (ZESTRIL) 40 MG tablet Take 1 tablet (40 mg total) by mouth daily. 90 tablet 3  . loperamide (IMODIUM A-D) 2 MG tablet Take 2 at diarrhea onset , then 1 after every watery BM. Please call if not helping 100 tablet 1  . NEEDLE, DISP, 30 G (BD DISP NEEDLES) 30G X 1/2" MISC Use as directed with insulin three times daily 100 each 6  . oxyCODONE-acetaminophen (PERCOCET) 10-325 MG tablet Take 1 tablet by mouth every 4 (four) hours as needed for pain.     Marland Kitchen PACLitaxel Protein-Bound Part (ABRAXANE IV) Inject into the vein. Days 1, 8, 15 q 28 days    . WIXELA INHUB 250-50 MCG/DOSE AEPB INHALE 1 PUFF INTO THE LUNGS TWICE DAILY 60 each 2  . diphenoxylate-atropine (LOMOTIL) 2.5-0.025 MG tablet Take 1 tablet by mouth 4 (four) times daily as needed for diarrhea or loose stools. (Patient not taking: Reported on 05/09/2020) 30 tablet 3  . lidocaine-prilocaine (EMLA) cream  (Patient not taking: Reported on 05/09/2020)    . ondansetron (ZOFRAN) 4 MG tablet TAKE 1 TABLET(4 MG) BY MOUTH THREE TIMES DAILY (Patient not taking: Reported on 05/09/2020) 60 tablet 3   No current facility-administered medications for this visit.    ALLERGIES:  Allergies  Allergen Reactions  . Hydrocodone Hives  .  Tramadol Itching    PHYSICAL EXAM:  Performance status (ECOG): 1 - Symptomatic but completely ambulatory  Vitals:   05/09/20 0845  BP: 130/74  Pulse: (!) 102  Resp: 18  Temp: (!) 96.9 F (36.1 C)  SpO2: 98%   Wt Readings from Last 3 Encounters:  05/09/20 231 lb 9.6 oz (105.1 kg)  04/25/20 (!) 234 lb 8 oz (106.4 kg)  04/11/20 234 lb 3.2 oz (106.2 kg)   Physical Exam Vitals reviewed.  Constitutional:      Appearance: Normal appearance. She is obese.  Cardiovascular:     Rate and Rhythm: Normal rate and regular rhythm.     Pulses: Normal pulses.     Heart sounds: Normal heart sounds.  Pulmonary:     Effort: Pulmonary effort is normal.     Breath sounds: Normal breath sounds.  Neurological:     General: No focal deficit present.     Mental Status: She is alert and oriented to person, place, and time.  Psychiatric:        Mood and Affect: Mood normal.        Behavior: Behavior normal.     LABORATORY DATA:  I have reviewed the labs as listed.  CBC Latest Ref Rng & Units 05/09/2020 04/25/2020 04/11/2020  WBC 4.0 - 10.5 K/uL 6.5 6.2 5.4  Hemoglobin 12.0 - 15.0 g/dL 13.3 13.3 13.3  Hematocrit 36 - 46 % 42.9 43.2 42.9  Platelets 150 - 400 K/uL 258 189 226   CMP Latest Ref Rng & Units 05/09/2020 04/25/2020 04/11/2020  Glucose 70 - 99 mg/dL 238(H) 310(H) 305(H)  BUN 6 - 20 mg/dL _0 Creatinine 0.44 - 1.00 mg/dL 0.61 0.59 0.67  Sodium 135 - 145 mmol/L 135 135 133(L)  Potassium 3.5 - 5.1 mmol/L 3.8 3.6 3.6  Chloride 98 - 111 mmol/L 97(L) 99 95(L)  CO2 22 - 32 mmol/L _1 Calcium 8.9 - 10.3 mg/dL 9.0 8.9 9.1  Total Protein 6.5 - 8.1 g/dL 6.6 6.3(L) 6.8  Total Bilirubin 0.3 -  1.2 mg/dL 0.4 0.3 0.5  Alkaline Phos 38 - 126 U/L 74 94 88  AST 15 - 41 U/L _0 ALT 0 - 44 U/L _1 Lab Results  Component Value Date   CAN199 3 11/23/2019   FTD322 <2 07/12/2019   GUR427 <2 06/14/2019    DIAGNOSTIC IMAGING:  I have independently reviewed the scans and  discussed with the patient. No results found.   ASSESSMENT:  1. Metastatic pancreatic cancer to the liverand lungs, MSI stable: -8 cycles of gemcitabine and Abraxane from 05/17/2019 through 12/28/2019 with progression. -CT CAP on 02/07/2020 showed subcentimeter new pulmonary nodules. Pancreatic tail mass measured 5.5 x 3.7 cm (3.2 x 1.9 cm). Hepatic steatosis with lobular contours. Low-density lesion in the posterior right hemiliver 11 mm, previously 9 mm. Some nodularity in the sigmoid mesocolon and along the right hepatic margin just above the gallbladder fossa. -5-FU and Onivyde started on 02/15/2019 with 20% dose reduction. -UG T1 A1 test negative.   PLAN:  1. Metastatic pancreatic cancer to the liver and lungs: -She has tolerated last cycle reasonably well with 33% dose reduction. -She requests that she does not want to wear the pump today as her family is coming home. -I have reviewed her labs.  LFTs are normal.  White count and platelets are adequate. -She will receive 5-FU and leucovorin bolus along with Onivyde.  We will not give infusional dose today. -She complained of slight worsening of abdominal pain, sharp lasting few minutes, and bilateral upper quadrants.  Not associated with food or bowels. -I have recommended imaging of the abdomen prior to next visit in 2 weeks.  2. Peripheral neuropathy: -Continue gabapentin 2-3 times a day.  3. Constipation: -Continue Colace at bedtime and lactulose as needed.  4. Hypertension: -Continue Norvasc 5 mg daily.  5. Diabetes: -Continue NovoLog 25 units 3 times a day.   Orders placed this encounter:  No orders of the defined types were placed in this encounter.    Derek Jack, MD [place] [phone]   I, Milinda Antis, am acting as a scribe for Dr. Sanda Linger.  I, Derek Jack MD, have reviewed the above documentation for accuracy and completeness, and I agree with the  above.

## 2020-05-09 NOTE — Patient Instructions (Signed)
[place] at [facility] Discharge Instructions  You were seen today by Dr. Delton Coombes. He went over your recent results. You received your treatment today. You will be scheduled for a CT scan of your chest and abdomen. Dr. Delton Coombes will see you back in 2 weeks for labs and follow up.   Thank you for choosing [place] at [facility] to provide your oncology and hematology care.  To afford each patient quality time with our provider, please arrive at least 15 minutes before your scheduled appointment time.   If you have a lab appointment with the [facility] please come in thru the Main Entrance and check in at the main information desk  You need to re-schedule your appointment should you arrive 10 or more minutes late.  We strive to give you quality time with our providers, and arriving late affects you and other patients whose appointments are after yours.  Also, if you no show three or more times for appointments you may be dismissed from the clinic at the providers discretion.     Again, thank you for choosing [facility].  Our hope is that these requests will decrease the amount of time that you wait before being seen by our physicians.       _____________________________________________________________  Should you have questions after your visit to [facility], please contact our office at [phone] between the hours of 8:00 a.m. and 4:30 p.m.  Voicemails left after 4:00 p.m. will not be returned until the following business day.  For prescription refill requests, have your pharmacy contact our office and allow 72 hours.    Cancer Center Support Programs:   > Cancer Support Group  2nd Tuesday of the month 1pm-2pm, Journey Room

## 2020-05-09 NOTE — Progress Notes (Signed)
Patient tolerated chemotherapy with no complaints voiced.  Side effects with management reviewed with understanding verbalized.  Port site clean and dry with no bruising or swelling noted at site.  Good blood return noted before and after administration of chemotherapy.  Band aid applied.  Patient left in satisfactory condition with VSS and no s/s of distress noted.   

## 2020-05-09 NOTE — Progress Notes (Signed)
05/09/20  Orders received today:  give tx. d/c 5FU pump, but give 5FU bolus 400mg /m2 along with leucovorin 400mg /m2.  Confirmed dose of irinotecan Liposomal at 46.6 mg/m2 as last dose - continue dose reduction.  T.O. Dr Rhys Martini, PharmD

## 2020-05-10 LAB — CANCER ANTIGEN 19-9: CA 19-9: 8 U/mL (ref 0–35)

## 2020-05-11 ENCOUNTER — Encounter (HOSPITAL_COMMUNITY): Payer: Medicare HMO

## 2020-05-17 ENCOUNTER — Ambulatory Visit: Payer: Medicare HMO

## 2020-05-22 ENCOUNTER — Ambulatory Visit (HOSPITAL_COMMUNITY): Payer: Medicare HMO

## 2020-05-22 ENCOUNTER — Other Ambulatory Visit (HOSPITAL_COMMUNITY): Payer: Medicare HMO

## 2020-05-22 ENCOUNTER — Ambulatory Visit (HOSPITAL_COMMUNITY): Payer: Medicare HMO | Admitting: Hematology

## 2020-05-24 ENCOUNTER — Encounter (HOSPITAL_COMMUNITY): Payer: Medicare HMO

## 2020-05-28 ENCOUNTER — Other Ambulatory Visit: Payer: Self-pay

## 2020-05-28 ENCOUNTER — Encounter (HOSPITAL_COMMUNITY): Payer: Self-pay

## 2020-05-28 ENCOUNTER — Other Ambulatory Visit (HOSPITAL_COMMUNITY): Payer: Self-pay | Admitting: Hematology

## 2020-05-28 ENCOUNTER — Ambulatory Visit (HOSPITAL_COMMUNITY)
Admission: RE | Admit: 2020-05-28 | Discharge: 2020-05-28 | Disposition: A | Discharge status: Home / Self Care | Payer: Medicare HMO | Source: Ambulatory Visit | Attending: Hematology | Admitting: Hematology

## 2020-05-28 DIAGNOSIS — C787 Secondary malignant neoplasm of liver and intrahepatic bile duct: Secondary | ICD-10-CM | POA: Insufficient documentation

## 2020-05-28 DIAGNOSIS — Z86711 Personal history of pulmonary embolism: Secondary | ICD-10-CM | POA: Insufficient documentation

## 2020-05-28 DIAGNOSIS — C259 Malignant neoplasm of pancreas, unspecified: Secondary | ICD-10-CM | POA: Diagnosis not present

## 2020-05-28 DIAGNOSIS — I2694 Multiple subsegmental pulmonary emboli without acute cor pulmonale: Secondary | ICD-10-CM

## 2020-05-28 MED ORDER — IOHEXOL 300 MG/ML  SOLN
100.0000 mL | Freq: Once | INTRAMUSCULAR | Status: AC | PRN
  Administered 2020-05-28: 100 mL via INTRAVENOUS

## 2020-05-28 MED ORDER — APIXABAN (ELIQUIS) VTE STARTER PACK (10MG AND 5MG): ORAL_TABLET | ORAL | 0 refills | Status: AC

## 2020-05-28 NOTE — Progress Notes (Signed)
Patient aware of eliquis prescription ordered by Dr. Delton Coombes. Patient verbalized understanding.

## 2020-06-06 ENCOUNTER — Other Ambulatory Visit: Payer: Self-pay

## 2020-06-06 ENCOUNTER — Inpatient Hospital Stay (HOSPITAL_BASED_OUTPATIENT_CLINIC_OR_DEPARTMENT_OTHER): Payer: Medicare HMO | Admitting: Hematology

## 2020-06-06 ENCOUNTER — Inpatient Hospital Stay (HOSPITAL_COMMUNITY): Payer: Medicare HMO

## 2020-06-06 ENCOUNTER — Inpatient Hospital Stay (HOSPITAL_COMMUNITY): Payer: Medicare HMO | Attending: Hematology

## 2020-06-06 ENCOUNTER — Other Ambulatory Visit (HOSPITAL_COMMUNITY): Payer: Self-pay | Admitting: *Deleted

## 2020-06-06 VITALS — HR 96 | Wt 231.2 lb

## 2020-06-06 DIAGNOSIS — C787 Secondary malignant neoplasm of liver and intrahepatic bile duct: Secondary | ICD-10-CM | POA: Diagnosis not present

## 2020-06-06 DIAGNOSIS — Z801 Family history of malignant neoplasm of trachea, bronchus and lung: Secondary | ICD-10-CM | POA: Diagnosis not present

## 2020-06-06 DIAGNOSIS — C252 Malignant neoplasm of tail of pancreas: Secondary | ICD-10-CM | POA: Insufficient documentation

## 2020-06-06 DIAGNOSIS — E119 Type 2 diabetes mellitus without complications: Secondary | ICD-10-CM | POA: Insufficient documentation

## 2020-06-06 DIAGNOSIS — Z794 Long term (current) use of insulin: Secondary | ICD-10-CM | POA: Diagnosis not present

## 2020-06-06 DIAGNOSIS — Z79899 Other long term (current) drug therapy: Secondary | ICD-10-CM | POA: Diagnosis not present

## 2020-06-06 DIAGNOSIS — Z833 Family history of diabetes mellitus: Secondary | ICD-10-CM | POA: Insufficient documentation

## 2020-06-06 DIAGNOSIS — Z95828 Presence of other vascular implants and grafts: Secondary | ICD-10-CM

## 2020-06-06 DIAGNOSIS — C259 Malignant neoplasm of pancreas, unspecified: Secondary | ICD-10-CM | POA: Diagnosis not present

## 2020-06-06 DIAGNOSIS — Z8249 Family history of ischemic heart disease and other diseases of the circulatory system: Secondary | ICD-10-CM | POA: Diagnosis not present

## 2020-06-06 DIAGNOSIS — Z800 Family history of malignant neoplasm of digestive organs: Secondary | ICD-10-CM | POA: Insufficient documentation

## 2020-06-06 DIAGNOSIS — G629 Polyneuropathy, unspecified: Secondary | ICD-10-CM | POA: Diagnosis not present

## 2020-06-06 DIAGNOSIS — I10 Essential (primary) hypertension: Secondary | ICD-10-CM | POA: Insufficient documentation

## 2020-06-06 DIAGNOSIS — Z8042 Family history of malignant neoplasm of prostate: Secondary | ICD-10-CM | POA: Insufficient documentation

## 2020-06-06 DIAGNOSIS — K5900 Constipation, unspecified: Secondary | ICD-10-CM | POA: Insufficient documentation

## 2020-06-06 DIAGNOSIS — C7800 Secondary malignant neoplasm of unspecified lung: Secondary | ICD-10-CM | POA: Insufficient documentation

## 2020-06-06 DIAGNOSIS — F17210 Nicotine dependence, cigarettes, uncomplicated: Secondary | ICD-10-CM | POA: Insufficient documentation

## 2020-06-06 DIAGNOSIS — Z86711 Personal history of pulmonary embolism: Secondary | ICD-10-CM | POA: Insufficient documentation

## 2020-06-06 DIAGNOSIS — Z7901 Long term (current) use of anticoagulants: Secondary | ICD-10-CM | POA: Diagnosis not present

## 2020-06-06 LAB — COMPREHENSIVE METABOLIC PANEL
ALT: 12 U/L (ref 0–44)
AST: 15 U/L (ref 15–41)
Albumin: 3.2 g/dL — ABNORMAL LOW (ref 3.5–5.0)
Alkaline Phosphatase: 70 U/L (ref 38–126)
Anion gap: 13 (ref 5–15)
BUN: 8 mg/dL (ref 6–20)
CO2: 28 mmol/L (ref 22–32)
Calcium: 8.9 mg/dL (ref 8.9–10.3)
Chloride: 95 mmol/L — ABNORMAL LOW (ref 98–111)
Creatinine, Ser: 0.72 mg/dL (ref 0.44–1.00)
GFR calc Af Amer: >60 mL/min (ref >60)
GFR calc non Af Amer: >60 mL/min (ref >60)
Glucose, Bld: 206 mg/dL — ABNORMAL HIGH (ref 70–99)
Potassium: 3.8 mmol/L (ref 3.5–5.1)
Sodium: 136 mmol/L (ref 135–145)
Total Bilirubin: 1 mg/dL (ref 0.3–1.2)
Total Protein: 6.3 g/dL — ABNORMAL LOW (ref 6.5–8.1)

## 2020-06-06 LAB — CBC WITH DIFFERENTIAL/PLATELET
Abs Immature Granulocytes: 0.02 10*3/uL (ref 0.00–0.07)
Basophils Absolute: 0 10*3/uL (ref 0.0–0.1)
Basophils Relative: 1 %
Eosinophils Absolute: 0.2 10*3/uL (ref 0.0–0.5)
Eosinophils Relative: 3 %
HCT: 42.4 % (ref 36.0–46.0)
Hemoglobin: 13.2 g/dL (ref 12.0–15.0)
Immature Granulocytes: 0 %
Lymphocytes Relative: 23 %
Lymphs Abs: 1.4 10*3/uL (ref 0.7–4.0)
MCH: 23.9 pg — ABNORMAL LOW (ref 26.0–34.0)
MCHC: 31.1 g/dL (ref 30.0–36.0)
MCV: 76.8 fL — ABNORMAL LOW (ref 80.0–100.0)
Monocytes Absolute: 0.6 10*3/uL (ref 0.1–1.0)
Monocytes Relative: 10 %
Neutro Abs: 3.7 10*3/uL (ref 1.7–7.7)
Neutrophils Relative %: 63 %
Platelets: 299 10*3/uL (ref 150–400)
RBC: 5.52 MIL/uL — ABNORMAL HIGH (ref 3.87–5.11)
RDW: 18.8 % — ABNORMAL HIGH (ref 11.5–15.5)
WBC: 6 10*3/uL (ref 4.0–10.5)
nRBC: 0 % (ref 0.0–0.2)

## 2020-06-06 MED ORDER — DRONABINOL 2.5 MG PO CAPS: 2.5000 mg | ORAL_CAPSULE | Freq: Two times a day (BID) | ORAL | 0 refills | Status: AC

## 2020-06-06 MED ORDER — OXYCODONE-ACETAMINOPHEN 10-325 MG PO TABS
1.0000 | ORAL_TABLET | ORAL | 0 refills | Status: DC | PRN
Start: 2020-06-06 — End: 2020-06-14

## 2020-06-06 MED ORDER — MORPHINE SULFATE ER 30 MG PO TBCR: 30.0000 mg | EXTENDED_RELEASE_TABLET | Freq: Two times a day (BID) | ORAL | 0 refills | Status: DC

## 2020-06-06 MED ORDER — LACTULOSE 20 GM/30ML PO SOLN: 30.0000 mL | Freq: Every evening | ORAL | 2 refills | Status: DC | PRN

## 2020-06-06 MED ORDER — SODIUM CHLORIDE 0.9% FLUSH
10.0000 mL | Freq: Once | INTRAVENOUS | Status: AC
  Administered 2020-06-06: 10 mL

## 2020-06-06 MED ORDER — HEPARIN SOD (PORK) LOCK FLUSH 100 UNIT/ML IV SOLN
500.0000 [IU] | Freq: Once | INTRAVENOUS | Status: AC
  Administered 2020-06-06: 500 [IU] via INTRAVENOUS

## 2020-06-06 NOTE — Progress Notes (Signed)
DISCONTINUE ON PATHWAY REGIMEN - Pancreatic Adenocarcinoma     A cycle is every 14 days:     Liposomal irinotecan      Leucovorin      Fluorouracil   **Always confirm dose/schedule in your pharmacy ordering system**  REASON: Disease Progression PRIOR TREATMENT: PANOS74: Liposomal Irinotecan 70 mg/m2 + Fluorouracil 2,400 mg/m2 CIV + Leucovorin 400 mg/m2 q14 Days TREATMENT RESPONSE: Progressive Disease (PD)  START ON PATHWAY REGIMEN - Pancreatic Adenocarcinoma     A cycle is every 14 days:     Oxaliplatin      Leucovorin      Fluorouracil      Fluorouracil   **Always confirm dose/schedule in your pharmacy ordering system**  Patient Characteristics: Metastatic Disease, Third Line and Beyond, MSS/pMMR or MSI Unknown Therapeutic Status: Metastatic Disease Line of Therapy: Third Engineer, civil (consulting) Status: MSS/pMMR Intent of Therapy: Non-Curative / Palliative Intent, Discussed with Patient

## 2020-06-06 NOTE — Patient Instructions (Signed)
[facility] at [facility]  Discharge Instructions:   _______________________________________________________________  Thank you for choosing [facility] at [facility] to provide your oncology and hematology care.  To afford each patient quality time with our providers, please arrive at least 15 minutes before your scheduled appointment.  You need to re-schedule your appointment if you arrive 10 or more minutes late.  We strive to give you quality time with our providers, and arriving late affects you and other patients whose appointments are after yours.  Also, if you no show three or more times for appointments you may be dismissed from the clinic.  Again, thank you for choosing  [facility] Center at  Hospital. Our hope is that these requests will allow you access to exceptional care and in a timely manner. _______________________________________________________________  If you have questions after your visit, please contact our office at [phone] between the hours of 8:30 a.m. and 5:00 p.m. Voicemails left after 4:30 p.m. will not be returned until the following business day. _______________________________________________________________  For prescription refill requests, have your pharmacy contact our office. _______________________________________________________________  Recommendations made by the consultant and any test results will be sent to your referring physician. _______________________________________________________________ 

## 2020-06-06 NOTE — Progress Notes (Signed)
No treatment today.  Patient has progressed on her scans. We will send Guardant 360 CDx today. Primary RN and pharmacy aware.   We are changing her pain medication, per Dr. Delton Coombes, we will send Morphine long acting for pain, she will continue her short acting percocet for break through pain. We will also send something for her appetite.  She will come back next week for new treatment.

## 2020-06-06 NOTE — Patient Instructions (Signed)
[place] at [facility] Discharge Instructions  You were seen today by Dr. Delton Coombes. He went over your recent results and scans. You will be prescribed lactulose to take every 3 hours until you have a bowel movement. If your constipation does not resolve, please call the office. You will be prescribed Marinol to take to improve your appetite. You will also be prescribed oxycodone 15 mg and MS Contin to take for your pain. Dr. Delton Coombes will see you back in 1 week for labs and initiating new chemotherapy.   Thank you for choosing [place] at [facility] to provide your oncology and hematology care.  To afford each patient quality time with our provider, please arrive at least 15 minutes before your scheduled appointment time.   If you have a lab appointment with the [place] please come in thru the Main Entrance and check in at the main information desk  You need to re-schedule your appointment should you arrive 10 or more minutes late.  We strive to give you quality time with our providers, and arriving late affects you and other patients whose appointments are after yours.  Also, if you no show three or more times for appointments you may be dismissed from the clinic at the providers discretion.     Again, thank you for choosing [facility].  Our hope is that these requests will decrease the amount of time that you wait before being seen by our physicians.       _____________________________________________________________  Should you have questions after your visit to [facility], please contact our office at [phone] between the hours of 8:00 a.m. and 4:30 p.m.  Voicemails left after 4:00 p.m. will not be returned until the following business day.  For prescription refill requests, have your pharmacy contact our office and allow 72 hours.    Cancer Center Support Programs:   > Cancer Support Group  2nd  Tuesday of the month 1pm-2pm, Journey Room

## 2020-06-06 NOTE — Progress Notes (Signed)
ON PATHWAY REGIMEN - Pancreatic Adenocarcinoma  No Change  Continue With Treatment as Ordered.  Original Decision Date/Time: 02/09/2020 10:21     A cycle is every 14 days:     Liposomal irinotecan      Leucovorin      Fluorouracil   **Always confirm dose/schedule in your pharmacy ordering system**  Patient Characteristics: Metastatic Disease, Second Line, MSS/pMMR or MSI Unknown, Gemcitabine-Based Therapy First Line Therapeutic Status: Metastatic Disease Line of Therapy: Second Line Microsatellite/Mismatch Repair Status: MSS/pMMR Intent of Therapy: Non-Curative / Palliative Intent, Discussed with Patient

## 2020-06-06 NOTE — Progress Notes (Signed)
Patient presents today for treatment and follow up visit with Dr. Delton Coombes. Labs within parameters for treatment. Vital signs within parameters for treatment.   Message received from [place] RN/ Dr. Delton Coombes. NO treatment today.

## 2020-06-06 NOTE — Progress Notes (Signed)
[place], [place] [address]   CLINIC:  Medical Oncology/Hematology  PCP:  Merdis Delay, DO [address] / [place] [address] [phone]   REASON FOR VISIT:  Follow-up for metastatic pancreatic cancer to liver  PRIOR THERAPY: Gemcitabine and Abraxane x 8 cycles from 05/17/2019 to 12/28/2019  NGS Results: Foundation 1 MS--stable, 3 Muts/Mb, KRAS G12V  CURRENT THERAPY: FOLFIRI and Aloxi  BRIEF ONCOLOGIC HISTORY:  Oncology History  Pancreatic cancer metastasized to liver ([place])  05/09/2019 Initial Diagnosis   Pancreatic cancer metastasized to liver ([place])   05/17/2019 - 02/08/2020 Chemotherapy   The patient had PACLitaxel-protein bound (ABRAXANE) chemo infusion 275 mg, 125 mg/m2 = 275 mg, Intravenous,  Once, 8 of 9 cycles Dose modification: 100 mg/m2 (80 % of original dose 125 mg/m2, Cycle 1, Reason: Other (see comments), Comment: neutropenia), 93.75 mg/m2 (75 % of original dose 125 mg/m2, Cycle 1, Reason: Other (see comments), Comment: neutropenia), 100 mg/m2 (80 % of original dose 125 mg/m2, Cycle 2, Reason: Other (see comments), Comment: cytopenias with previous treatment), 75 mg/m2 (original dose 125 mg/m2, Cycle 3, Reason: Provider Judgment, Comment: neuropathy stated as 10/10 dose reduce required per NP [place] Bowl) Administration: 275 mg (05/17/2019), 225 mg (05/24/2019), 200 mg (05/31/2019), 225 mg (06/14/2019), 225 mg (06/28/2019), 225 mg (07/12/2019), 175 mg (07/19/2019), 175 mg (08/16/2019), 175 mg (08/31/2019), 175 mg (09/27/2019), 175 mg (10/11/2019), 175 mg (10/25/2019), 175 mg (11/08/2019), 175 mg (11/23/2019), 175 mg (12/08/2019), 175 mg (12/28/2019), 175 mg (01/19/2020) ondansetron (ZOFRAN) 8 mg in sodium chloride 0.9 % 50 mL IVPB, 8 mg (100 % of original dose 8 mg), Intravenous,  Once, 1 of 1 cycle Dose modification: 8 mg (original dose 8 mg, Cycle 1) gemcitabine (GEMZAR) 2,200 mg in sodium chloride 0.9 % 250 mL chemo infusion, 2,204 mg,  Intravenous,  Once, 8 of 9 cycles Dose modification: 750 mg/m2 (75 % of original dose 1,000 mg/m2, Cycle 1, Reason: Other (see comments), Comment: neutropenia), 750 mg/m2 (75 % of original dose 1,000 mg/m2, Cycle 1, Reason: Other (see comments), Comment: neutropenia), 800 mg/m2 (80 % of original dose 1,000 mg/m2, Cycle 2, Reason: Other (see comments), Comment: cytopenia) Administration: 2,200 mg (05/17/2019), 1,600 mg (05/24/2019), 1,600 mg (05/31/2019), 1,786 mg (06/14/2019), 1,786 mg (06/28/2019), 1,786 mg (07/12/2019), 1,786 mg (07/19/2019), 1,786 mg (08/16/2019), 1,786 mg (08/31/2019), 1,786 mg (09/27/2019), 1,786 mg (10/11/2019), 1,786 mg (10/25/2019), 1,786 mg (11/08/2019), 1,786 mg (11/23/2019), 1,786 mg (12/08/2019), 1,786 mg (12/28/2019), 1,786 mg (01/19/2020) ondansetron (ZOFRAN) 8 mg, dexamethasone (DECADRON) 10 mg in sodium chloride 0.9 % 50 mL IVPB, , Intravenous,  Once, 8 of 9 cycles Administration:  (05/17/2019),  (05/24/2019),  (05/31/2019),  (06/14/2019),  (06/28/2019),  (07/12/2019),  (07/19/2019),  (08/16/2019),  (08/31/2019),  (09/27/2019),  (10/11/2019),  (10/25/2019),  (11/08/2019),  (11/23/2019),  (12/08/2019),  (12/28/2019),  (01/19/2020)  for chemotherapy treatment.    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  The Common Hereditary Gene Panel offered by [facility] includes sequencing and/or deletion duplication testing of the following 48 genes: APC, ATM, AXIN2, BARD1, BMPR1A, BRCA1, BRCA2, BRIP1, CDH1, CDK4, CDKN2A (p14ARF), CDKN2A (p16INK4a), CHEK2, CTNNA1, DICER1, EPCAM (Deletion/duplication testing only), GREM1 (promoter region deletion/duplication testing only), KIT, MEN1, MLH1, MSH2, MSH3, MSH6, MUTYH, NBN, NF1, NHTL1, PALB2, PDGFRA, PMS2, POLD1, POLE, PTEN, RAD50, RAD51C, RAD51D, RNF43, SDHB, SDHC, SDHD, SMAD4, SMARCA4. STK11, TP53, TSC1, TSC2, and VHL.  The following genes were evaluated for sequence changes only: SDHA and  HOXB13  c.251G>A variant only. The report date is August 02, 2019.   02/15/2020 -  Chemotherapy   The patient had palonosetron (ALOXI) injection 0.25 mg, 0.25 mg, Intravenous,  Once, 5 of 6 cycles Administration: 0.25 mg (02/15/2020), 0.25 mg (02/29/2020), 0.25 mg (03/14/2020), 0.25 mg (04/25/2020), 0.25 mg (05/09/2020) fluorouracil (ADRUCIL) chemo injection 950 mg, 400 mg/m2 = 950 mg (100 % of original dose 400 mg/m2), Intravenous,  Once, 1 of 1 cycle Dose modification: 400 mg/m2 (original dose 400 mg/m2, Cycle 5) Administration: 950 mg (05/09/2020) fluorouracil (ADRUCIL) 4,500 mg in sodium chloride 0.9 % 60 mL chemo infusion, 1,920 mg/m2 = 4,500 mg (80 % of original dose 2,400 mg/m2), Intravenous, 1 Day/Dose, 5 of 6 cycles Dose modification: 1,920 mg/m2 (80 % of original dose 2,400 mg/m2, Cycle 1, Reason: Provider Judgment), 1,680 mg/m2 (70 % of original dose 2,400 mg/m2, Cycle 2, Reason: Other (see comments), Comment: side effects), 1,600 mg/m2 (66.7 % of original dose 2,400 mg/m2, Cycle 4, Reason: Provider Judgment) Administration: 4,500 mg (02/15/2020), 3,950 mg (02/29/2020), 3,950 mg (03/14/2020), 3,750 mg (04/25/2020) irinotecan LIPOSOME (ONIVYDE) 129 mg in sodium chloride 0.9 % 500 mL chemo infusion, 56 mg/m2 = 129 mg (80 % of original dose 70 mg/m2), Intravenous, Once, 5 of 6 cycles Dose modification: 56 mg/m2 (80 % of original dose 70 mg/m2, Cycle 1, Reason: Provider Judgment), 56 mg/m2 (80 % of original dose 70 mg/m2, Cycle 1, Reason: Provider Judgment), 49 mg/m2 (70 % of original dose 70 mg/m2, Cycle 2, Reason: Other (see comments), Comment: side effects), 46.6667 mg/m2 (66.7 % of original dose 70 mg/m2, Cycle 4, Reason: Provider Judgment), 46.667 mg/m2 (original dose 70 mg/m2, Cycle 6, Reason: Provider Judgment, Comment: side effects) Administration: 129 mg (02/15/2020), 116.1 mg (02/29/2020), 116.1 mg (03/14/2020), 107.5 mg (04/25/2020), 107.5 mg (05/09/2020) leucovorin 748 mg in sodium chloride 0.9 % 250 mL  infusion, 320 mg/m2 = 748 mg (80 % of original dose 400 mg/m2), Intravenous,  Once, 5 of 6 cycles Dose modification: 320 mg/m2 (80 % of original dose 400 mg/m2, Cycle 1, Reason: Provider Judgment), 280 mg/m2 (70 % of original dose 400 mg/m2, Cycle 2, Reason: Other (see comments), Comment: side effects), 867.6720 mg/m2 (66.7 % of original dose 400 mg/m2, Cycle 4, Reason: Provider Judgment) Administration: 748 mg (02/15/2020), 656 mg (02/29/2020), 656 mg (03/14/2020), 624 mg (04/25/2020), 936 mg (05/09/2020)  for chemotherapy treatment.      CANCER STAGING: Cancer Staging No matching staging information was found for the patient.  INTERVAL HISTORY:  [name], a 59 y.o. female, returns for routine follow-up and consideration for next cycle of chemotherapy. Doris was last seen on 05/09/2020.  Due for cycle #6 of FOLFIRI and Aloxi today.   Today she complains of having severe abdominal pain and constipation for 6 days. Because of the constipation, her appetite has decreased. She is taking Percocet 10 TID which helps alleviate the pain for an hour or two, but then the pain returns. She is tearful about her worsening condition and cancer prognosis. She remembers having cold sensitivity with her previous chemo treatments.   REVIEW OF SYSTEMS:  Review of Systems  Constitutional: Positive for appetite change (severely decreased) and fatigue (depleted).  Respiratory: Positive for cough.   Gastrointestinal: Positive for abdominal distention (d/t constipation), abdominal pain (10/10 pain) and constipation.  Psychiatric/Behavioral: Positive for sleep disturbance.  All other systems reviewed and are negative.   PAST MEDICAL/SURGICAL HISTORY:  Past Medical History:  Diagnosis Date  . Diabetes mellitus without complication (  [place])   . Family history of prostate cancer   . Family history of stomach cancer   . Hypertension   . Pancreatic cancer ([place])   . Port-A-Cath in place 05/16/2019   Past  Surgical History:  Procedure Laterality Date  . ABDOMINAL HYSTERECTOMY    . BACK SURGERY    . KIDNEY SURGERY     per pt, had leakage  . tubal ligation Left     SOCIAL HISTORY:  Social History   Socioeconomic History  . Marital status: Divorced    Spouse name: Not on file  . Number of children: 1  . Years of education: Not on file  . Highest education level: Not on file  Occupational History  . Occupation: disabled  Tobacco Use  . Smoking status: Current Every Day Smoker    Packs/day: 1.00    Years: 45.00    Pack years: 45.00    Types: Cigarettes  . Smokeless tobacco: Never Used  Vaping Use  . Vaping Use: Never used  Substance and Sexual Activity  . Alcohol use: Not Currently  . Drug use: Never  . Sexual activity: Not on file  Other Topics Concern  . Not on file  Social History Narrative  . Not on file   Social Determinants of Health   Financial Resource Strain:   . Difficulty of Paying Living Expenses: Not on file  Food Insecurity:   . Worried About Charity fundraiser in the Last Year: Not on file  . Ran Out of Food in the Last Year: Not on file  Transportation Needs:   . Lack of Transportation (Medical): Not on file  . Lack of Transportation (Non-Medical): Not on file  Physical Activity:   . Days of Exercise per Week: Not on file  . Minutes of Exercise per Session: Not on file  Stress:   . Feeling of Stress : Not on file  Social Connections:   . Frequency of Communication with Friends and Family: Not on file  . Frequency of Social Gatherings with Friends and Family: Not on file  . Attends Religious Services: Not on file  . Active Member of Clubs or Organizations: Not on file  . Attends Archivist Meetings: Not on file  . Marital Status: Not on file  Intimate Partner Violence:   . Fear of Current or Ex-Partner: Not on file  . Emotionally Abused: Not on file  . Physically Abused: Not on file  . Sexually Abused: Not on file    FAMILY  HISTORY:  Family History  Problem Relation Age of Onset  . Diabetes Mother   . Hypertension Mother   . Lung cancer Mother 52       d. 49  . Diabetes Father   . Hypertension Father   . Heart disease Brother   . Stomach cancer Paternal Aunt 59  . Stroke Paternal Grandmother   . Prostate cancer Other        PGMs brother  . Stomach cancer Other        PGMs mother    CURRENT MEDICATIONS:  Current Outpatient Medications  Medication Sig Dispense Refill  . amLODipine (NORVASC) 5 MG tablet Take 1 tablet (5 mg total) by mouth 2 (two) times daily. 90 tablet 3  . APIXABAN (ELIQUIS) VTE STARTER PACK (10MG AND 5MG) Take as directed on package: start with two-15m tablets twice daily for 7 days. On day 8, switch to one-547mtablet twice daily. 1 each 0  . diphenoxylate-atropine (  LOMOTIL) 2.5-0.025 MG tablet Take 1 tablet by mouth 4 (four) times daily as needed for diarrhea or loose stools. 30 tablet 3  . docusate sodium (COLACE) 100 MG capsule Take 2 capsules (200 mg total) by mouth at bedtime. 10 capsule 0  . Gemcitabine HCl (GEMZAR IV) Inject into the vein. Days 1, 8, 15 q 28 days    . insulin aspart (NOVOLOG) 100 UNIT/ML injection Inject 20 Units into the skin 3 (three) times daily before meals. 10 mL 3  . Insulin Syringe-Needle U-100 (INSULIN SYRINGE 1CC/31GX5/16") 31G X 5/16" 1 ML MISC     . lisinopril (ZESTRIL) 40 MG tablet Take 1 tablet (40 mg total) by mouth daily. 90 tablet 3  . loperamide (IMODIUM A-D) 2 MG tablet Take 2 at diarrhea onset , then 1 after every watery BM. Please call if not helping 100 tablet 1  . NEEDLE, DISP, 30 G (BD DISP NEEDLES) 30G X 1/2" MISC Use as directed with insulin three times daily 100 each 6  . oxyCODONE-acetaminophen (PERCOCET) 10-325 MG tablet Take 1 tablet by mouth every 4 (four) hours as needed for pain.     Marland Kitchen PACLitaxel Protein-Bound Part (ABRAXANE IV) Inject into the vein. Days 1, 8, 15 q 28 days    . WIXELA INHUB 250-50 MCG/DOSE AEPB INHALE 1 PUFF INTO  THE LUNGS TWICE DAILY 60 each 2  . Lactulose 20 GM/30ML SOLN Take 30 mLs (20 g total) by mouth at bedtime as needed. 450 mL 2  . lidocaine-prilocaine (EMLA) cream  (Patient not taking: Reported on 06/06/2020)    . ondansetron (ZOFRAN) 4 MG tablet TAKE 1 TABLET(4 MG) BY MOUTH THREE TIMES DAILY (Patient not taking: Reported on 06/06/2020) 60 tablet 3   No current facility-administered medications for this visit.    ALLERGIES:  Allergies  Allergen Reactions  . Hydrocodone Hives  . Tramadol Itching    PHYSICAL EXAM:  Performance status (ECOG): 1 - Symptomatic but completely ambulatory  Vitals:   06/06/20 1002  Pulse: 96   Wt Readings from Last 3 Encounters:  06/06/20 231 lb 3.2 oz (104.9 kg)  05/09/20 231 lb 9.6 oz (105.1 kg)  04/25/20 (!) 234 lb 8 oz (106.4 kg)   Physical Exam Vitals reviewed.  Constitutional:      Appearance: Normal appearance. She is obese.  Cardiovascular:     Rate and Rhythm: Normal rate and regular rhythm.     Pulses: Normal pulses.     Heart sounds: Normal heart sounds.  Pulmonary:     Effort: Pulmonary effort is normal.     Breath sounds: Normal breath sounds.  Neurological:     General: No focal deficit present.     Mental Status: She is alert and oriented to person, place, and time.  Psychiatric:        Mood and Affect: Mood is anxious. Affect is tearful.        Behavior: Behavior normal.     LABORATORY DATA:  I have reviewed the labs as listed.  CBC Latest Ref Rng & Units 06/06/2020 05/09/2020 04/25/2020  WBC 4.0 - 10.5 K/uL 6.0 6.5 6.2  Hemoglobin 12.0 - 15.0 g/dL 13.2 13.3 13.3  Hematocrit 36 - 46 % 42.4 42.9 43.2  Platelets 150 - 400 K/uL 299 258 189   CMP Latest Ref Rng & Units 06/06/2020 05/09/2020 04/25/2020  Glucose 70 - 99 mg/dL 206(H) 238(H) 310(H)  BUN 6 - 20 mg/dL '8 7 7  ' Creatinine 0.44 - 1.00 mg/dL 0.72 0.61  0.59  Sodium 135 - 145 mmol/L 136 135 135  Potassium 3.5 - 5.1 mmol/L 3.8 3.8 3.6  Chloride 98 - 111 mmol/L 95(L) 97(L) 99    CO2 22 - 32 mmol/L '28 28 27  ' Calcium 8.9 - 10.3 mg/dL 8.9 9.0 8.9  Total Protein 6.5 - 8.1 g/dL 6.3(L) 6.6 6.3(L)  Total Bilirubin 0.3 - 1.2 mg/dL 1.0 0.4 0.3  Alkaline Phos 38 - 126 U/L 70 74 94  AST 15 - 41 U/L '15 17 16  ' ALT 0 - 44 U/L '12 15 20   ' Lab Results  Component Value Date   GXQ119 8 05/09/2020   ERD408 3 11/23/2019   XKG818 <2 07/12/2019    DIAGNOSTIC IMAGING:  I have independently reviewed the scans and discussed with the patient. CT CHEST ABDOMEN PELVIS W CONTRAST  Addendum Date: 05/28/2020   ADDENDUM REPORT: 05/28/2020 13:36 ADDENDUM: Critical Value/emergent results were called by telephone at the time of interpretation on 05/28/2020 at 1:30 pm to provider [facility] , who verbally acknowledged these results. Electronically Signed   By: Julian Hy M.D.   On: 05/28/2020 13:36   Result Date: 05/28/2020 CLINICAL DATA:  Metastatic pancreatic cancer EXAM: CT CHEST, ABDOMEN, AND PELVIS WITH CONTRAST TECHNIQUE: Multidetector CT imaging of the chest, abdomen and pelvis was performed following the standard protocol during bolus administration of intravenous contrast. CONTRAST:  168m OMNIPAQUE IOHEXOL 300 MG/ML  SOLN COMPARISON:  02/07/2020 FINDINGS: CT CHEST FINDINGS Cardiovascular: The heart is normal in size. No pericardial effusion. No evidence of thoracic aortic aneurysm. Atherosclerotic calcifications of the aortic arch. Although not tailored for evaluation of the pulmonary arteries, incidental segmental/subsegmental right lower lobe pulmonary emboli are suspected (series 3/images 91 and 100). Overall clot burden is small. No findings to suggest right heart strain (RV to LV ratio 0.87). Right chest port terminates at the cavoatrial junction. Mediastinum/Nodes: No suspicious mediastinal lymphadenopathy. 9 mm short axis left perihilar node (series 3/image 77), grossly unchanged. Visualized thyroid is unremarkable. Lungs/Pleura: Numerous scattered small pulmonary nodules  throughout all lobes, measuring up to 6 mm in the left lower lobe (series 10/images 74, 76, and 101), previously 4-5 mm. This appearance remains compatible with metastatic disease. Mild centrilobular emphysematous changes, upper lung predominant. No focal consolidation. No pleural effusion or pneumothorax. Musculoskeletal: Degenerative changes of the thoracic spine. CT ABDOMEN PELVIS FINDINGS Hepatobiliary: Multiple hepatic metastases, progressive, now measuring up to 2.3 cm inferiorly in the right hepatic lobe (series 3/image 168). Previously, image 6 single live mm lesion. Gallbladder is unremarkable. No intrahepatic or extrahepatic ductal dilatation. Pancreas: 3.9 x 5.1 cm mass involving the pancreatic body/proximal tail (series 3/image 169), previously 3.7 x 5.5 cm, grossly unchanged. Mass encases the celiac trunk and directly invades the left adrenal gland. Spleen: Within normal limits. Adrenals/Urinary Tract: Left renal invasion, as above. Right adrenal gland is within normal limits. Left kidney is within normal limits. Stable right renal scarring with 7 mm nonobstructing right lower pole renal calculus (series 3/image 213) and fullness of the right renal collecting system. Bladder is underdistended and poorly evaluated. Stomach/Bowel: Stomach is within normal limits. No evidence of bowel obstruction. No colonic wall thickening or mass is evident on CT. Vascular/Lymphatic: No evidence of abdominal aortic aneurysm. Atherosclerotic calcifications of the abdominal aorta and branch vessels. No suspicious abdominopelvic lymphadenopathy. Reproductive: Status post hysterectomy. Pelvic nodularity at the vaginal apex is poorly visualized on the current CT. No adnexal masses. Other: Small to moderate abdominopelvic ascites, new. Associated mild peritoneal disease/omental caking.  Musculoskeletal: Degenerative changes of the lumbar spine. IMPRESSION: Although not tailored for evaluation of the pulmonary arteries, there  are incidental segmental/subsegmental right lower lobe pulmonary emboli. Overall clot burden is small. No findings to suggest right heart strain. 5.1 cm pancreatic mass, corresponding to the patient's known pancreatic adenocarcinoma, grossly unchanged. Associated vascular encasement. Direct invasion of the left adrenal gland. Progressive hepatic metastases. Small to moderate abdominopelvic ascites with mild peritoneal disease/omental caking, new. Multifocal pulmonary metastases measuring up to 6 mm, mildly progressive. 9 mm short axis left perihilar node, grossly unchanged. Electronically Signed: By: Julian Hy M.D. On: 05/28/2020 13:26     ASSESSMENT:  1. Metastatic pancreatic cancer to the liverand lungs, MSI stable: -Pancreatic tail mass biopsy on 01/05/2019 shows moderately to poorly differentiated adenocarcinoma. -8 cycles of gemcitabine and Abraxane from 05/17/2019 through 12/28/2019 with progression. -CT CAP on 02/07/2020 showed subcentimeter new pulmonary nodules. Pancreatic tail mass measured 5.5 x 3.7 cm (3.2 x 1.9 cm). Hepatic steatosis with lobular contours. Low-density lesion in the posterior right hemiliver 11 mm, previously 9 mm. Some nodularity in the sigmoid mesocolon and along the right hepatic margin just above the gallbladder fossa. -5 cycles of 5-FU and Onivyde from 02/15/2020 through 05/09/2020 with progression and poor tolerance. -UG T1 A1 test negative. 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 This puts her at risk for breast and slight risk for ovarian cancer. -CT CAP on 05/28/2020 with slight increase in size of numerous lung nodules. Multiple liver metastasis, progressive now measuring up to 2.3 cm inferiorly in the right hepatic lobe. Pancreatic body/proximal tail mass measuring 3.9 x 5.1 cm, unchanged. -Foundation 1 results from 05/19/2019 shows MS-stable, ARID1A, CDK 6 amplification, K-ras G12V, TP53.  2.  Pulmonary embolism: -CT CAP on 05/28/2020 showed incidental segmental/subsegmental right lower lobe pulmonary emboli. -She was started on Eliquis.  PLAN:  1. Metastatic pancreatic cancer to the liver and lungs: -We reviewed results of the CT scan which showed progression. -Reportedly her foundation 1 test did not show any targetable mutations. -I have recommended guardant 360 testing. -I have recommended palliative chemotherapy with FOLFOX. She was initially treated with 3 cycles of FOLFOXIRI, discontinued secondary to poor tolerance. -We will consider dose reduction of oxaliplatin as she had cold sensitivity lasting up to 1 week. -We discussed side effects in detail. -I will also be on the look out for clinical trials.  2. Peripheral neuropathy: -Continue gabapentin 2-3 times a day.  3. Constipation: -Continue Colace at bedtime and lactulose as needed.  4. Hypertension: -Continue Norvasc 5 mg daily.  5. Diabetes: -Continue NovoLog.  6. Pulmonary embolism: -She is tolerating Eliquis very well. No bleeding issues.  7. Abdominal pain: -She reports poorly controlled abdominal pain. She is taking Percocet 10/325 4-6 times a day. -We will start her on MS Contin 30 mg every 12 hours. She will take Percocet for breakthrough pain.   Orders placed this encounter:  No orders of the defined types were placed in this encounter.  Total time spent is 40 minutes with more than 50% of the time spent face-to-face discussing scan results, treatment plan, side effects, counseling and coordination of care.  Derek Jack, MD [place] [phone]   I, Milinda Antis, am acting as a scribe for Dr. Sanda Linger.  I, Derek Jack MD, have reviewed the above documentation for accuracy and completeness, and I agree with the above.

## 2020-06-08 ENCOUNTER — Encounter (HOSPITAL_COMMUNITY): Payer: Medicare HMO

## 2020-06-10 ENCOUNTER — Other Ambulatory Visit: Payer: Self-pay

## 2020-06-10 ENCOUNTER — Emergency Department (HOSPITAL_COMMUNITY): Payer: Medicare HMO

## 2020-06-10 ENCOUNTER — Inpatient Hospital Stay (HOSPITAL_COMMUNITY)
Admission: EM | Admit: 2020-06-10 | Discharge: 2020-06-14 | DRG: 436 | Disposition: A | Discharge status: Home / Self Care | Payer: Medicare HMO | Attending: Internal Medicine | Admitting: Internal Medicine

## 2020-06-10 ENCOUNTER — Encounter (HOSPITAL_COMMUNITY): Payer: Self-pay | Admitting: Emergency Medicine

## 2020-06-10 DIAGNOSIS — Z20822 Contact with and (suspected) exposure to covid-19: Secondary | ICD-10-CM | POA: Diagnosis present

## 2020-06-10 DIAGNOSIS — E8809 Other disorders of plasma-protein metabolism, not elsewhere classified: Secondary | ICD-10-CM | POA: Diagnosis not present

## 2020-06-10 DIAGNOSIS — R109 Unspecified abdominal pain: Secondary | ICD-10-CM | POA: Diagnosis not present

## 2020-06-10 DIAGNOSIS — C259 Malignant neoplasm of pancreas, unspecified: Principal | ICD-10-CM | POA: Diagnosis present

## 2020-06-10 DIAGNOSIS — Z79891 Long term (current) use of opiate analgesic: Secondary | ICD-10-CM | POA: Diagnosis not present

## 2020-06-10 DIAGNOSIS — E441 Mild protein-calorie malnutrition: Secondary | ICD-10-CM | POA: Diagnosis present

## 2020-06-10 DIAGNOSIS — F17210 Nicotine dependence, cigarettes, uncomplicated: Secondary | ICD-10-CM | POA: Diagnosis present

## 2020-06-10 DIAGNOSIS — C787 Secondary malignant neoplasm of liver and intrahepatic bile duct: Secondary | ICD-10-CM | POA: Diagnosis present

## 2020-06-10 DIAGNOSIS — C786 Secondary malignant neoplasm of retroperitoneum and peritoneum: Secondary | ICD-10-CM | POA: Diagnosis present

## 2020-06-10 DIAGNOSIS — R140 Abdominal distension (gaseous): Secondary | ICD-10-CM | POA: Diagnosis present

## 2020-06-10 DIAGNOSIS — E1165 Type 2 diabetes mellitus with hyperglycemia: Secondary | ICD-10-CM | POA: Diagnosis present

## 2020-06-10 DIAGNOSIS — E669 Obesity, unspecified: Secondary | ICD-10-CM | POA: Diagnosis present

## 2020-06-10 DIAGNOSIS — Z8042 Family history of malignant neoplasm of prostate: Secondary | ICD-10-CM

## 2020-06-10 DIAGNOSIS — Z794 Long term (current) use of insulin: Secondary | ICD-10-CM

## 2020-06-10 DIAGNOSIS — Z79899 Other long term (current) drug therapy: Secondary | ICD-10-CM | POA: Diagnosis not present

## 2020-06-10 DIAGNOSIS — Z7901 Long term (current) use of anticoagulants: Secondary | ICD-10-CM

## 2020-06-10 DIAGNOSIS — Z86711 Personal history of pulmonary embolism: Secondary | ICD-10-CM

## 2020-06-10 DIAGNOSIS — Z801 Family history of malignant neoplasm of trachea, bronchus and lung: Secondary | ICD-10-CM | POA: Diagnosis not present

## 2020-06-10 DIAGNOSIS — Z90710 Acquired absence of both cervix and uterus: Secondary | ICD-10-CM | POA: Diagnosis not present

## 2020-06-10 DIAGNOSIS — C799 Secondary malignant neoplasm of unspecified site: Secondary | ICD-10-CM

## 2020-06-10 DIAGNOSIS — Z9221 Personal history of antineoplastic chemotherapy: Secondary | ICD-10-CM | POA: Diagnosis not present

## 2020-06-10 DIAGNOSIS — I2699 Other pulmonary embolism without acute cor pulmonale: Secondary | ICD-10-CM | POA: Diagnosis not present

## 2020-06-10 DIAGNOSIS — R188 Other ascites: Secondary | ICD-10-CM

## 2020-06-10 DIAGNOSIS — R180 Malignant ascites: Secondary | ICD-10-CM | POA: Diagnosis present

## 2020-06-10 DIAGNOSIS — I10 Essential (primary) hypertension: Secondary | ICD-10-CM | POA: Diagnosis present

## 2020-06-10 DIAGNOSIS — Z6832 Body mass index (BMI) 32.0-32.9, adult: Secondary | ICD-10-CM | POA: Diagnosis not present

## 2020-06-10 LAB — URINALYSIS, ROUTINE W REFLEX MICROSCOPIC
Glucose, UA: NEGATIVE mg/dL
Hgb urine dipstick: NEGATIVE
Ketones, ur: 5 mg/dL — AB
Leukocytes,Ua: NEGATIVE
Nitrite: NEGATIVE
Protein, ur: 30 mg/dL — AB
Specific Gravity, Urine: 1.028 (ref 1.005–1.030)
pH: 5 (ref 5.0–8.0)

## 2020-06-10 LAB — CBC
HCT: 44.2 % (ref 36.0–46.0)
Hemoglobin: 13.9 g/dL (ref 12.0–15.0)
MCH: 24 pg — ABNORMAL LOW (ref 26.0–34.0)
MCHC: 31.4 g/dL (ref 30.0–36.0)
MCV: 76.5 fL — ABNORMAL LOW (ref 80.0–100.0)
Platelets: 346 10*3/uL (ref 150–400)
RBC: 5.78 MIL/uL — ABNORMAL HIGH (ref 3.87–5.11)
RDW: 18.4 % — ABNORMAL HIGH (ref 11.5–15.5)
WBC: 6.1 10*3/uL (ref 4.0–10.5)
nRBC: 0 % (ref 0.0–0.2)

## 2020-06-10 LAB — COMPREHENSIVE METABOLIC PANEL
ALT: 13 U/L (ref 0–44)
AST: 17 U/L (ref 15–41)
Albumin: 3.3 g/dL — ABNORMAL LOW (ref 3.5–5.0)
Alkaline Phosphatase: 70 U/L (ref 38–126)
Anion gap: 12 (ref 5–15)
BUN: 10 mg/dL (ref 6–20)
CO2: 28 mmol/L (ref 22–32)
Calcium: 9.5 mg/dL (ref 8.9–10.3)
Chloride: 95 mmol/L — ABNORMAL LOW (ref 98–111)
Creatinine, Ser: 0.69 mg/dL (ref 0.44–1.00)
GFR calc Af Amer: >60 mL/min (ref >60)
GFR calc non Af Amer: >60 mL/min (ref >60)
Glucose, Bld: 229 mg/dL — ABNORMAL HIGH (ref 70–99)
Potassium: 4.7 mmol/L (ref 3.5–5.1)
Sodium: 135 mmol/L (ref 135–145)
Total Bilirubin: 0.5 mg/dL (ref 0.3–1.2)
Total Protein: 6.7 g/dL (ref 6.5–8.1)

## 2020-06-10 LAB — LIPASE, BLOOD: Lipase: 17 U/L (ref 11–51)

## 2020-06-10 MED ORDER — SODIUM CHLORIDE 0.9 % IV BOLUS (SEPSIS)
1000.0000 mL | Freq: Once | INTRAVENOUS | Status: AC
  Administered 2020-06-10: 1000 mL via INTRAVENOUS

## 2020-06-10 MED ORDER — IOHEXOL 300 MG/ML  SOLN
100.0000 mL | Freq: Once | INTRAMUSCULAR | Status: AC | PRN
  Administered 2020-06-10: 100 mL via INTRAVENOUS

## 2020-06-10 MED ORDER — HYDROMORPHONE HCL 1 MG/ML IJ SOLN
INTRAMUSCULAR | Status: AC
  Filled 2020-06-10: qty 1

## 2020-06-10 MED ORDER — FLEET ENEMA 7-19 GM/118ML RE ENEM
1.0000 | ENEMA | Freq: Once | RECTAL | Status: AC
  Administered 2020-06-10: 1 via RECTAL

## 2020-06-10 MED ORDER — SODIUM CHLORIDE 0.9 % IV SOLN
1000.0000 mL | INTRAVENOUS | Status: DC
  Administered 2020-06-10 – 2020-06-11 (×2): 1000 mL via INTRAVENOUS

## 2020-06-10 MED ORDER — HYDROMORPHONE HCL 1 MG/ML IJ SOLN
1.0000 mg | Freq: Once | INTRAMUSCULAR | Status: AC
  Administered 2020-06-10: 1 mg via INTRAVENOUS

## 2020-06-10 MED ORDER — HYDROMORPHONE HCL 1 MG/ML IJ SOLN
1.0000 mg | INTRAMUSCULAR | Status: AC | PRN
  Administered 2020-06-10 (×2): 1 mg via INTRAVENOUS
  Filled 2020-06-10 (×2): qty 1

## 2020-06-10 MED ORDER — HYDROMORPHONE HCL 1 MG/ML IJ SOLN
0.5000 mg | INTRAMUSCULAR | Status: DC | PRN
  Administered 2020-06-11 (×3): 0.5 mg via INTRAVENOUS
  Filled 2020-06-10 (×3): qty 1

## 2020-06-10 NOTE — ED Notes (Signed)
Pt denies any relief from fleet enema

## 2020-06-10 NOTE — ED Triage Notes (Signed)
Pt c/o upper abdominal pain, tenderness, and swelling. Pt is currently receiving chemo for pancreatic cancer. States she was placed on morphine on Wednesday to help with the pain, but is unable to take most of her medications because she is unable to eat. Pt c/o nausea and generalized weakness.

## 2020-06-10 NOTE — ED Notes (Signed)
Pt to CT

## 2020-06-10 NOTE — ED Provider Notes (Signed)
[facility] EMERGENCY DEPARTMENT Provider Note   CSN: [id number] Arrival date & time: 06/10/20  1418     History Chief Complaint  Patient presents with  . Abdominal Pain    Doris [name] is a 59 y.o. female.  HPI   Patient presents to the ED for evaluation of worsening abdominal pain.  Patient has a history of pancreatic cancer.  She has been receiving chemotherapy treatment at this hospital.  Patient states she has not had any treatment last couple of weeks because recent CT scan showed worsening of the cancer and her doctor was cannot discuss further treatment with another oncologist.  Patient states she was recently diagnosed with a pulmonary embolism and was started on anticoagulants.  She is taking her pain medications but her pain is not getting any better.  She has not been able to eat or drink.  Her abdomen is feeling more distended and tender.  Patient denies any fever.  She has not had normal bowel movement recently.  No urinary changes.  Past Medical History:  Diagnosis Date  . Diabetes mellitus without complication ([place])   . Family history of prostate cancer   . Family history of stomach cancer   . Hypertension   . Pancreatic cancer ([place])   . Port-A-Cath in place 05/16/2019    Patient Active Problem List   Diagnosis Date Noted  . Pulmonary embolism ([place]) 05/28/2020  . Genetic testing 09/02/2019  . Family history of stomach cancer   . Family history of prostate cancer   . Port-A-Cath in place 05/16/2019  . Pancreatic cancer metastasized to liver ([place]) 05/09/2019  . Goals of care, counseling/discussion 05/09/2019    Past Surgical History:  Procedure Laterality Date  . ABDOMINAL HYSTERECTOMY    . BACK SURGERY    . KIDNEY SURGERY     per pt, had leakage  . tubal ligation Left      OB History   No obstetric history on file.     Family History  Problem Relation Age of Onset  . Diabetes Mother   . Hypertension Mother   . Lung cancer Mother 31       d.  25  . Diabetes Father   . Hypertension Father   . Heart disease Brother   . Stomach cancer Paternal Aunt 33  . Stroke Paternal Grandmother   . Prostate cancer Other        PGMs brother  . Stomach cancer Other        PGMs mother    Social History   Tobacco Use  . Smoking status: Current Every Day Smoker    Packs/day: 1.00    Years: 45.00    Pack years: 45.00    Types: Cigarettes  . Smokeless tobacco: Never Used  Vaping Use  . Vaping Use: Never used  Substance Use Topics  . Alcohol use: Not Currently  . Drug use: Never    Home Medications Prior to Admission medications   Medication Sig Start Date End Date Taking? Authorizing Provider  amLODipine (NORVASC) 5 MG tablet Take 1 tablet (5 mg total) by mouth 2 (two) times daily. 06/01/19  Yes Derek Jack, MD  APIXABAN Arne [place]) VTE STARTER PACK (10MG  AND 5MG ) Take as directed on package: start with two-5mg  tablets twice daily for 7 days. On day 8, switch to one-5mg  tablet twice daily. Patient taking differently: Take 5 mg by mouth daily.  05/28/20  Yes Derek Jack, MD  diphenoxylate-atropine (LOMOTIL) 2.5-0.025 MG tablet Take 1 tablet  by mouth 4 (four) times daily as needed for diarrhea or loose stools. 02/15/20  Yes Derek Jack, MD  dronabinol (MARINOL) 2.5 MG capsule Take 1 capsule (2.5 mg total) by mouth 2 (two) times daily before a meal. 06/06/20  Yes Derek Jack, MD  Gemcitabine HCl (GEMZAR IV) Inject into the vein See admin instructions. Days 1, 8, 15 q 28 days  05/17/19  Yes [provider]  insulin aspart (NOVOLOG) 100 UNIT/ML injection Inject 20 Units into the skin 3 (three) times daily before meals. 06/01/19  Yes Derek Jack, MD  Lactulose 20 GM/30ML SOLN Take 30 mLs (20 g total) by mouth at bedtime as needed. 06/06/20  Yes Derek Jack, MD  lidocaine-prilocaine (EMLA) cream Apply 1 application topically once as needed (for port access).  04/07/20  Yes [provider]   lisinopril (ZESTRIL) 40 MG tablet Take 1 tablet (40 mg total) by mouth daily. 06/01/19  Yes Derek Jack, MD  morphine (MS CONTIN) 30 MG 12 hr tablet Take 1 tablet (30 mg total) by mouth every 12 (twelve) hours. 06/06/20  Yes Derek Jack, MD  ondansetron (ZOFRAN) 4 MG tablet TAKE 1 TABLET(4 MG) BY MOUTH THREE TIMES DAILY 02/29/20  Yes Derek Jack, MD  oxyCODONE-acetaminophen (PERCOCET) 10-325 MG tablet Take 1 tablet by mouth every 4 (four) hours as needed for pain. 06/06/20  Yes Derek Jack, MD  PACLitaxel Protein-Bound Part (ABRAXANE IV) Inject into the vein See admin instructions. Days 1, 8, 15 q 28 days  05/17/19  Yes [provider]  Grant Ruts INHUB 250-50 MCG/DOSE AEPB INHALE 1 PUFF INTO THE LUNGS TWICE DAILY Patient not taking: Reported on 06/10/2020 02/03/20   Francene Finders L, NP-C  prochlorperazine (COMPAZINE) 10 MG tablet Take 1 tablet (10 mg total) by mouth every 6 (six) hours as needed (Nausea or vomiting). Patient not taking: Reported on 10/25/2019 06/01/19 02/09/20  Derek Jack, MD    Allergies    Hydrocodone and Tramadol  Review of Systems   Review of Systems  All other systems reviewed and are negative.   Physical Exam Updated Vital Signs BP 135/73   Pulse 98   Temp 98.3 F (36.8 C) (Oral)   Resp 18   Ht 1.803 m (5\' 11" )   Wt 105.7 kg   SpO2 94%   BMI 32.50 kg/m   Physical Exam Vitals and nursing note reviewed.  Constitutional:      Appearance: She is well-developed. She is ill-appearing.  HENT:     Head: Normocephalic and atraumatic.     Right Ear: External ear normal.     Left Ear: External ear normal.  Eyes:     General: No scleral icterus.       Right eye: No discharge.        Left eye: No discharge.     Conjunctiva/sclera: Conjunctivae normal.  Neck:     Trachea: No tracheal deviation.  Cardiovascular:     Rate and Rhythm: Normal rate and regular rhythm.  Pulmonary:     Effort: Pulmonary effort is normal. No  respiratory distress.     Breath sounds: Normal breath sounds. No stridor. No wheezing or rales.  Abdominal:     General: Abdomen is protuberant. Bowel sounds are normal. There is distension.     Palpations: Abdomen is soft.     Tenderness: There is no guarding or rebound.     Comments: Abdomen firm, hepatomegaly  Musculoskeletal:        General: No tenderness.  Cervical back: Neck supple.  Skin:    General: Skin is warm and dry.     Findings: No rash.  Neurological:     Mental Status: She is alert.     Cranial Nerves: No cranial nerve deficit (no facial droop, extraocular movements intact, no slurred speech).     Sensory: No sensory deficit.     Motor: No abnormal muscle tone or seizure activity.     Coordination: Coordination normal.     ED Results / Procedures / Treatments   Labs (all labs ordered are listed, but only abnormal results are displayed) Labs Reviewed  COMPREHENSIVE METABOLIC PANEL - Abnormal; Notable for the following components:      Result Value   Chloride 95 (*)    Glucose, Bld 229 (*)    Albumin 3.3 (*)    All other components within normal limits  CBC - Abnormal; Notable for the following components:   RBC 5.78 (*)    MCV 76.5 (*)    MCH 24.0 (*)    RDW 18.4 (*)    All other components within normal limits  URINALYSIS, ROUTINE W REFLEX MICROSCOPIC - Abnormal; Notable for the following components:   Color, Urine AMBER (*)    APPearance HAZY (*)    Bilirubin Urine SMALL (*)    Ketones, ur 5 (*)    Protein, ur 30 (*)    Bacteria, UA RARE (*)    All other components within normal limits  LIPASE, BLOOD    EKG None  Radiology CT ABDOMEN PELVIS W CONTRAST  Result Date: 06/10/2020 CLINICAL DATA:  Abdominal pain, tenderness and swelling, receiving chemotherapy for pancreatic cancer EXAM: CT ABDOMEN AND PELVIS WITH CONTRAST TECHNIQUE: Multidetector CT imaging of the abdomen and pelvis was performed using the standard protocol following bolus  administration of intravenous contrast. CONTRAST:  169mL OMNIPAQUE IOHEXOL 300 MG/ML  SOLN COMPARISON:  CT chest, abdomen and pelvis 05/28/2020. FINDINGS: Lower chest: Redemonstration of the scattered sub 5 mm pulmonary nodules in both lower lobes, right middle lobe and lingula compatible with suspected metastatic disease. Lung bases are otherwise clear. Normal heart size. No pericardial effusion. Hepatobiliary: Redemonstration of multiple hepatic metastases demonstrated by some rim enhancement and central hypoattenuation, largest in the inferior right lobe liver measuring up to 3 cm which is not significantly changed in size from the comparison exam when measured at a similar level. Largest lesion in the left lobe liver measures up to 1.7 cm, also unchanged from prior. Few subcentimeter hypoattenuating foci present in the superior right lobe (2/11, 12) are better visualized than on comparison. Normal hepatic attenuation. Smooth liver surface contour. No visible calcified gallstones. Mild gallbladder wall thickening, nonspecific given some adjacent ascites. No biliary ductal dilatation. Pancreas: Redemonstration of the heterogeneous, hypoattenuating 3.9 by 5.1 cm mass involving the pancreatic tail/body (2/32). Mass appears to encase the proximal celiac axis, splenic artery and vein as well as portion of the body of the left adrenal gland, overall similar in appearance to comparison study. Spleen: Normal in size. No concerning splenic lesions. Adrenals/Urinary Tract: Involvement of the left adrenal gland by the pancreatic mass, as detailed above. No concerning right adrenal nodules. Asymmetric scarring of the interpolar right kidney, similar to comparison. Few parapelvic cysts are noted. Nonobstructing calculus present in the lower pole right kidney. No concerning renal masses. No urolithiasis or hydronephrosis. Urinary bladder is largely decompressed at the time of exam and therefore poorly evaluated by CT imaging.  No gross bladder abnormality is evident.  Some mild perivesicular hazy stranding is nonspecific however. Stomach/Bowel: Distal esophagus is unremarkable. There is some mild thickening at the gastric antrum with mucosal hyperemia, nonspecific and possibly related to normal peristalsis though could correlate for features of gastritis. No small bowel thickening or dilatation. Small air-filled retrocecal appendix. No colonic dilatation or wall thickening. Scattered colonic diverticula without focal inflammation to suggest diverticulitis. No clear mechanical obstruction. Vascular/Lymphatic: Atherosclerotic calcifications within the abdominal aorta and branch vessels. No aneurysm or ectasia. No discernible enlarged abdominopelvic lymph nodes. Reproductive: Post hysterectomy. Nodular changes along the superior vaginal cuff are again difficult to visualize though may be present (2/78). No other concerning adnexal lesions. Other: Increasing peritoneal nodularity/omental caking with increasing moderate volume loculated abdominal ascites throughout the abdomen. No free intraperitoneal air. No bowel containing hernias. Mild body wall edema. Musculoskeletal: Multilevel degenerative changes are present in the imaged portions of the spine. No acute osseous abnormality or suspicious osseous lesion. IMPRESSION: 1. Increasing peritoneal nodularity/omental caking with increasing moderate volume possibly loculated malignant ascites throughout the abdomen. 2. Redemonstration of the heterogeneous, hypoattenuating mass involving the pancreatic tail/body with encasement of the proximal celiac axis, splenic artery and vein as well as portion of the body of the left adrenal gland. 3. Multiple rim enhancing, centrally hypoattenuating liver lesions compatible with hepatic metastatic disease. 4. Redemonstration of the scattered sub 5 mm pulmonary nodules in the lung bases, compatible with suspected metastatic disease. 5. Mild thickening at the  gastric antrum with mucosal hyperemia, nonspecific and possibly related to normal peristalsis though could correlate for features of gastritis. 6. Nonobstructing right nephrolithiasis and right renal cortical scarring. 7. Mild perivesicular hazy stranding is nonspecific however recommend correlation with urinalysis to exclude infection. 8. Aortic Atherosclerosis (ICD10-I70.0). Electronically Signed   By: Lovena Le M.D.   On: 06/10/2020 20:54   DG Abdomen Acute W/Chest  Result Date: 06/10/2020 CLINICAL DATA:  59 year old with upper abdominal pain, tenderness and swelling. Current history of pancreatic cancer for which the patient is receiving chemotherapy. Current smoker. EXAM: ACUTE ABDOMEN SERIES (2 VIEW ABDOMEN AND 1 VIEW CHEST) COMPARISON:  CT chest, abdomen and pelvis 05/28/2020 and earlier. FINDINGS: Mild gaseous distension of the rectum in the transverse colon. Moderate colonic stool burden. No evidence of small-bowel distention. No evidence of free air or significant air-fluid levels on the ERECT image. Phleboliths in both sides of the pelvis. No visible opaque urinary tract calculi. Degenerative changes involving the lower lumbar spine. Cardiomediastinal silhouette unremarkable. RIGHT jugular Port-A-Cath tip projects at or near the cavoatrial junction. Mild emphysematous changes throughout both lungs as noted on the prior CT. The lung nodules identified on CT are inconspicuous on the x-ray. No confluent or ground-glass airspace consolidation. No pleural effusions. IMPRESSION: 1. No evidence of bowel obstruction or free intraperitoneal air. 2. Moderate colonic stool burden. 3. Mild COPD/emphysema. No acute cardiopulmonary disease.  S Electronically Signed   By: Evangeline Dakin M.D.   On: 06/10/2020 16:41   August 30 CT scan reviewed.  Patient did have incidental subsegmental pulmonary emboli.  CT of the abdomen also showed 5 cm pancreatic mass with vascular encasement.  Patient also had direct  invasion of the left adrenal gland.  She had progressive hepatic metastases with small to moderate abdominal pelvic ascites Procedures Procedures (including critical care time)  Medications Ordered in ED Medications  sodium chloride 0.9 % bolus 1,000 mL (0 mLs Intravenous Stopped 06/10/20 1800)    Followed by  0.9 %  sodium chloride infusion ( Intravenous Rate/Dose  Verify 06/10/20 2137)  HYDROmorphone (DILAUDID) injection 1 mg (1 mg Intravenous Not Given 06/10/20 1940)  sodium phosphate (FLEET) 7-19 GM/118ML enema 1 enema (1 enema Rectal Given 06/10/20 1758)  HYDROmorphone (DILAUDID) injection 1 mg (1 mg Intravenous Given 06/10/20 1942)  iohexol (OMNIPAQUE) 300 MG/ML solution 100 mL (100 mLs Intravenous Contrast Given 06/10/20 2025)    ED Course  I have reviewed the triage vital signs and the nursing notes.  Pertinent labs & imaging results that were available during my care of the patient were reviewed by me and considered in my medical decision making (see chart for details).  Clinical Course as of Jun 11 2243  Sun Jun 10, 2020  0768 CBC and metabolic panel unremarkable.  Lipase normal.   [JK]  1650 Xrays without signs of obstruction.  Moderate constipation.   [JK]  1730 Pain somewhat better. Pt requesting an enema   [JK]  2012 Patient requesting additional pain medications.  I will proceed with CT scan considering her persistent pain   [JK]  2202 CT scan shows worsening findings associated with her cancer.  Most likely has loculated malignant ascites now as well   [JK]    Clinical Course User Index [JK] Dorie Rank, MD   MDM Rules/Calculators/A&P                          Patient has an unfortunate history of metastatic pancreatic CA.  Patient presented with increasing abdominal pain and mid and abdominal distention.  Patient's ED work-up is notable for CT scan showing worsening findings associated with her cancer.  She also now has increasing malignant loculated ascites.  No signs  of obstruction.  Patient was treated with pain medications.  She has required several IV doses of narcotics.  Patient is better but but has required significant doses of pain medications to manage her pain.  Think is reasonable to bring her in for pain management.  Oncology can see her tomorrow to determine further treatment plans for her. Final Clinical Impression(s) / ED Diagnoses Final diagnoses:  Metastatic malignant neoplasm, unspecified site [facility])  Malignant ascites      Dorie Rank, MD 06/10/20 2247

## 2020-06-11 ENCOUNTER — Inpatient Hospital Stay (HOSPITAL_COMMUNITY): Payer: Medicare HMO

## 2020-06-11 ENCOUNTER — Other Ambulatory Visit (HOSPITAL_COMMUNITY): Payer: Self-pay | Admitting: Hematology

## 2020-06-11 DIAGNOSIS — E8809 Other disorders of plasma-protein metabolism, not elsewhere classified: Secondary | ICD-10-CM

## 2020-06-11 DIAGNOSIS — C259 Malignant neoplasm of pancreas, unspecified: Secondary | ICD-10-CM

## 2020-06-11 DIAGNOSIS — E1165 Type 2 diabetes mellitus with hyperglycemia: Secondary | ICD-10-CM

## 2020-06-11 LAB — CBG MONITORING, ED
Glucose-Capillary: 161 mg/dL — ABNORMAL HIGH (ref 70–99)
Glucose-Capillary: 152 mg/dL — ABNORMAL HIGH (ref 70–99)
Glucose-Capillary: 153 mg/dL — ABNORMAL HIGH (ref 70–99)
Glucose-Capillary: 115 mg/dL — ABNORMAL HIGH (ref 70–99)

## 2020-06-11 LAB — COMPREHENSIVE METABOLIC PANEL
ALT: 12 U/L (ref 0–44)
AST: 14 U/L — ABNORMAL LOW (ref 15–41)
Albumin: 3 g/dL — ABNORMAL LOW (ref 3.5–5.0)
Alkaline Phosphatase: 60 U/L (ref 38–126)
Anion gap: 9 (ref 5–15)
BUN: 8 mg/dL (ref 6–20)
CO2: 28 mmol/L (ref 22–32)
Calcium: 8.7 mg/dL — ABNORMAL LOW (ref 8.9–10.3)
Chloride: 97 mmol/L — ABNORMAL LOW (ref 98–111)
Creatinine, Ser: 0.66 mg/dL (ref 0.44–1.00)
GFR calc Af Amer: >60 mL/min (ref >60)
GFR calc non Af Amer: >60 mL/min (ref >60)
Glucose, Bld: 162 mg/dL — ABNORMAL HIGH (ref 70–99)
Potassium: 4 mmol/L (ref 3.5–5.1)
Sodium: 134 mmol/L — ABNORMAL LOW (ref 135–145)
Total Bilirubin: 0.8 mg/dL (ref 0.3–1.2)
Total Protein: 6 g/dL — ABNORMAL LOW (ref 6.5–8.1)

## 2020-06-11 LAB — CBC
HCT: 40.2 % (ref 36.0–46.0)
Hemoglobin: 12.6 g/dL (ref 12.0–15.0)
MCH: 24.1 pg — ABNORMAL LOW (ref 26.0–34.0)
MCHC: 31.3 g/dL (ref 30.0–36.0)
MCV: 76.9 fL — ABNORMAL LOW (ref 80.0–100.0)
Platelets: 314 10*3/uL (ref 150–400)
RBC: 5.23 MIL/uL — ABNORMAL HIGH (ref 3.87–5.11)
RDW: 18 % — ABNORMAL HIGH (ref 11.5–15.5)
WBC: 5.6 10*3/uL (ref 4.0–10.5)
nRBC: 0 % (ref 0.0–0.2)

## 2020-06-11 LAB — PROTIME-INR
INR: 1.2 (ref 0.8–1.2)
Prothrombin Time: 14.8 seconds (ref 11.4–15.2)

## 2020-06-11 LAB — APTT: aPTT: 34 seconds (ref 24–36)

## 2020-06-11 LAB — GLUCOSE, CAPILLARY: Glucose-Capillary: 113 mg/dL — ABNORMAL HIGH (ref 70–99)

## 2020-06-11 LAB — HEMOGLOBIN A1C
Hgb A1c MFr Bld: 8.8 % — ABNORMAL HIGH (ref 4.8–5.6)
Mean Plasma Glucose: 205.86 mg/dL

## 2020-06-11 LAB — HIV ANTIBODY (ROUTINE TESTING W REFLEX): HIV Screen 4th Generation wRfx: NONREACTIVE

## 2020-06-11 LAB — SARS CORONAVIRUS 2 BY RT PCR (HOSPITAL ORDER, PERFORMED IN ~~LOC~~ HOSPITAL LAB): SARS Coronavirus 2: NEGATIVE

## 2020-06-11 MED ORDER — HYDROMORPHONE HCL 1 MG/ML IJ SOLN
1.0000 mg | INTRAMUSCULAR | Status: DC | PRN
  Administered 2020-06-11 – 2020-06-12 (×7): 1 mg via INTRAVENOUS
  Filled 2020-06-11 (×7): qty 1

## 2020-06-11 MED ORDER — INSULIN ASPART 100 UNIT/ML ~~LOC~~ SOLN
0.0000 [IU] | SUBCUTANEOUS | Status: DC
  Administered 2020-06-11 (×3): 3 [IU] via SUBCUTANEOUS
  Administered 2020-06-12 – 2020-06-13 (×10): 2 [IU] via SUBCUTANEOUS
  Administered 2020-06-14: 3 [IU] via SUBCUTANEOUS
  Administered 2020-06-14: 2 [IU] via SUBCUTANEOUS
  Filled 2020-06-11 (×3): qty 1

## 2020-06-11 MED ORDER — MORPHINE SULFATE ER 30 MG PO TBCR
30.0000 mg | EXTENDED_RELEASE_TABLET | Freq: Two times a day (BID) | ORAL | Status: DC
  Administered 2020-06-11 – 2020-06-12 (×3): 30 mg via ORAL
  Filled 2020-06-11 (×3): qty 1

## 2020-06-11 MED ORDER — APIXABAN (ELIQUIS) VTE STARTER PACK (10MG AND 5MG): 5.0000 mg | ORAL_TABLET | Freq: Every day | ORAL | Status: DC

## 2020-06-11 MED ORDER — LISINOPRIL 10 MG PO TABS
40.0000 mg | ORAL_TABLET | Freq: Every day | ORAL | Status: DC
  Administered 2020-06-11 – 2020-06-14 (×4): 40 mg via ORAL
  Filled 2020-06-11 (×5): qty 4

## 2020-06-11 MED ORDER — PANTOPRAZOLE SODIUM 40 MG IV SOLR
40.0000 mg | Freq: Every day | INTRAVENOUS | Status: DC
  Administered 2020-06-11 – 2020-06-14 (×4): 40 mg via INTRAVENOUS
  Filled 2020-06-11 (×5): qty 40

## 2020-06-11 MED ORDER — SODIUM CHLORIDE 0.9 % IV SOLN
1000.0000 mL | INTRAVENOUS | Status: DC
  Administered 2020-06-11 – 2020-06-12 (×2): 1000 mL via INTRAVENOUS

## 2020-06-11 MED ORDER — INSULIN GLARGINE 100 UNIT/ML ~~LOC~~ SOLN
20.0000 [IU] | Freq: Every day | SUBCUTANEOUS | Status: DC
  Administered 2020-06-11: 20 [IU] via SUBCUTANEOUS
  Filled 2020-06-11 (×4): qty 0.2

## 2020-06-11 MED ORDER — LACTULOSE 10 GM/15ML PO SOLN
20.0000 g | Freq: Every evening | ORAL | Status: DC | PRN
  Filled 2020-06-11: qty 30

## 2020-06-11 MED ORDER — INSULIN ASPART 100 UNIT/ML ~~LOC~~ SOLN: 0.0000 [IU] | Freq: Three times a day (TID) | SUBCUTANEOUS | Status: DC

## 2020-06-11 MED ORDER — HYDROMORPHONE HCL 1 MG/ML IJ SOLN: 1.0000 mg | INTRAMUSCULAR | Status: DC | PRN

## 2020-06-11 MED ORDER — ENSURE ENLIVE PO LIQD
237.0000 mL | Freq: Two times a day (BID) | ORAL | Status: DC
  Administered 2020-06-12 – 2020-06-13 (×4): 237 mL via ORAL
  Filled 2020-06-11 (×2): qty 237

## 2020-06-11 MED ORDER — OXYCODONE-ACETAMINOPHEN 5-325 MG PO TABS
2.0000 | ORAL_TABLET | ORAL | Status: DC | PRN
  Administered 2020-06-11 – 2020-06-12 (×5): 2 via ORAL
  Filled 2020-06-11 (×5): qty 2

## 2020-06-11 MED ORDER — INSULIN ASPART 100 UNIT/ML ~~LOC~~ SOLN: 0.0000 [IU] | Freq: Every day | SUBCUTANEOUS | Status: DC

## 2020-06-11 NOTE — H&P (Signed)
History and Physical  Doris [name] OMV:[id number] DOB: 02/19/1961 DOA: 06/10/2020  Referring physician: Dorie Rank, MD PCP: Merdis Delay, DO  Patient coming from: Home   Chief Complaint: Abdominal pain  HPI: Doris [name] is a 59 y.o. 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  Abdominal pain was described as diffused and stabbing in nature, it was initially intermittent but has recently become more persistent.  Pain was rated as 10/10 and there was no alleviating/aggravating factor. Patient has been receiving chemotherapy treatment at this hospital, but she has not had any treatment in last couple of weeks due to recent CT scan that showed worsening of the cancer and was still in the process of determining further treatment with another oncologist.  She denies nausea, vomiting and diarrhea.  ED Course:  In the emergency department, she was intermittently tachycardic, otherwise she was hemodynamically stable.  Work-up in the ED showed normal CBC and BMP except for hyperglycemia and hypoalbuminemia.  Urinalysis was unimpressive for UTI.  CT abdomen and pelvis with contrast showed an increasing peritoneal nodularity/omental caking with increasing moderate volume possibly loculated malignant ascites throughout the abdomen.  Hypoattenuating mass involving the pancreatic tail/body with encasement of the proximal celiac axis, splenic artery and vein as well as partial of the body of the left adrenal gland was noted.  Multiple rim enhancing, centrally hypoattenuating liver lesions compatible with hepatic metastatic disease was also noted.  Pulmonary nodules in the lung bases compatible with suspected metastatic disease was noted.  IV Dilaudid 1 Mg x2 was given in the ED with slight improvement of abdominal pain.   Hospitalist was asked to admit patient for further evaluation and management.  Review of Systems: Constitutional: Negative for chills and fever.  HENT: Negative for ear pain and sore throat.   Eyes: Negative for pain and visual disturbance.  Respiratory: Negative for cough, chest tightness and shortness of breath.   Cardiovascular: Negative for chest pain and palpitations.  Gastrointestinal: Positive for abdominal pain and distention.  Negative for vomiting.  Endocrine: Negative for polyphagia and polyuria.  Genitourinary: Negative for decreased urine volume, dysuria Musculoskeletal: Negative for arthralgias and back pain.  Skin: Negative for color change and rash.  Allergic/Immunologic: Negative for immunocompromised state.  Neurological: Negative for tremors, syncope, speech difficulty, weakness, light-headedness and headaches.  Hematological: Does not bruise/bleed easily.  All other systems reviewed and are negative   Past Medical History:  Diagnosis Date  . Diabetes mellitus without complication ([place])   . Family history of prostate cancer   . Family history of stomach cancer   . Hypertension   . Pancreatic cancer ([place])   . Port-A-Cath in place 05/16/2019   Past Surgical History:  Procedure Laterality Date  . ABDOMINAL HYSTERECTOMY    . BACK SURGERY    . KIDNEY SURGERY     per pt, had leakage  . tubal ligation Left     Social History:  reports that she has been smoking cigarettes. She has a 45.00 pack-year smoking history. She has never used smokeless tobacco. She reports previous alcohol use. She reports that she does not use drugs.   Allergies  Allergen Reactions  . Hydrocodone Hives  . Tramadol Itching    Family History  Problem Relation Age of Onset  . Diabetes Mother   . Hypertension Mother   . Lung cancer  Mother 62       d. 48  . Diabetes Father   . Hypertension Father   . Heart disease Brother   . Stomach cancer Paternal Aunt 18  . Stroke Paternal  Grandmother   . Prostate cancer Other        PGMs brother  . Stomach cancer Other        PGMs mother     Prior to Admission medications   Medication Sig Start Date End Date Taking? Authorizing Provider  amLODipine (NORVASC) 5 MG tablet Take 1 tablet (5 mg total) by mouth 2 (two) times daily. 06/01/19  Yes Derek Jack, MD  APIXABAN Arne [place]) VTE STARTER PACK (10MG  AND 5MG ) Take as directed on package: start with two-5mg  tablets twice daily for 7 days. On day 8, switch to one-5mg  tablet twice daily. Patient taking differently: Take 5 mg by mouth daily.  05/28/20  Yes Derek Jack, MD  diphenoxylate-atropine (LOMOTIL) 2.5-0.025 MG tablet Take 1 tablet by mouth 4 (four) times daily as needed for diarrhea or loose stools. 02/15/20  Yes Derek Jack, MD  dronabinol (MARINOL) 2.5 MG capsule Take 1 capsule (2.5 mg total) by mouth 2 (two) times daily before a meal. 06/06/20  Yes Derek Jack, MD  Gemcitabine HCl (GEMZAR IV) Inject into the vein See admin instructions. Days 1, 8, 15 q 28 days  05/17/19  Yes [provider]  insulin aspart (NOVOLOG) 100 UNIT/ML injection Inject 20 Units into the skin 3 (three) times daily before meals. 06/01/19  Yes Derek Jack, MD  Lactulose 20 GM/30ML SOLN Take 30 mLs (20 g total) by mouth at bedtime as needed. 06/06/20  Yes Derek Jack, MD  lidocaine-prilocaine (EMLA) cream Apply 1 application topically once as needed (for port access).  04/07/20  Yes [provider]  lisinopril (ZESTRIL) 40 MG tablet Take 1 tablet (40 mg total) by mouth daily. 06/01/19  Yes Derek Jack, MD  morphine (MS CONTIN) 30 MG 12 hr tablet Take 1 tablet (30 mg total) by mouth every 12 (twelve) hours. 06/06/20  Yes Derek Jack, MD  ondansetron (ZOFRAN) 4 MG tablet TAKE 1 TABLET(4 MG) BY MOUTH THREE TIMES DAILY 02/29/20  Yes Derek Jack, MD  oxyCODONE-acetaminophen (PERCOCET) 10-325 MG tablet Take 1 tablet by mouth every  4 (four) hours as needed for pain. 06/06/20  Yes Derek Jack, MD  PACLitaxel Protein-Bound Part (ABRAXANE IV) Inject into the vein See admin instructions. Days 1, 8, 15 q 28 days  05/17/19  Yes [provider]  Grant Ruts INHUB 250-50 MCG/DOSE AEPB INHALE 1 PUFF INTO THE LUNGS TWICE DAILY Patient not taking: Reported on 06/10/2020 02/03/20   Francene Finders L, NP-C  prochlorperazine (COMPAZINE) 10 MG tablet Take 1 tablet (10 mg total) by mouth every 6 (six) hours as needed (Nausea or vomiting). Patient not taking: Reported on 10/25/2019 06/01/19 02/09/20  Derek Jack, MD    Physical Exam: BP (!) 125/43   Pulse 97   Temp 98.3 F (36.8 C) (Oral)   Resp 18   Ht 5\' 11"  (1.803 m)   Wt 105.7 kg   SpO2 96%   BMI 32.50 kg/m   . General: 59 y.o. year-old female well developed well developed, ill appearing.  Alert and oriented x3. Marland Kitchen HEENT: NCAT, EOMI . Neck: Supple, trachea medial . Cardiovascular: Regular rate and rhythm with no rubs or gallops.  No thyromegaly or JVD noted.  No lower extremity edema. 2/4 pulses in all 4 extremities. Marland Kitchen Respiratory: Clear to auscultation with  no wheezes or rales. Good inspiratory effort. . Abdomen: Firm, tender to palpation,distended with normal bowel sounds x4 quadrants. . Muskuloskeletal: No cyanosis, clubbing or edema noted bilaterally . Neuro: CN II-XII intact, strength, sensation, reflexes . Skin: No ulcerative lesions noted or rashes . Psychiatry: Judgement and insight appear normal. Mood is appropriate for condition and setting          Labs on Admission:  Basic Metabolic Panel: Recent Labs  Lab 06/06/20 0926 06/10/20 1455 06/11/20 0104  NA 136 135 134*  K 3.8 4.7 4.0  CL 95* 95* 97*  CO2 28 28 28   GLUCOSE 206* 229* 162*  BUN 8 10 8   CREATININE 0.72 0.69 0.66  CALCIUM 8.9 9.5 8.7*   Liver Function Tests: Recent Labs  Lab 06/06/20 0926 06/10/20 1455 06/11/20 0104  AST 15 17 14*  ALT 12 13 12   ALKPHOS 70 70 60  BILITOT  1.0 0.5 0.8  PROT 6.3* 6.7 6.0*  ALBUMIN 3.2* 3.3* 3.0*   Recent Labs  Lab 06/10/20 1455  LIPASE 17   No results for input(s): AMMONIA in the last 168 hours. CBC: Recent Labs  Lab 06/06/20 0926 06/10/20 1455 06/11/20 0104  WBC 6.0 6.1 5.6  NEUTROABS 3.7  --   --   HGB 13.2 13.9 12.6  HCT 42.4 44.2 40.2  MCV 76.8* 76.5* 76.9*  PLT 299 346 314   Cardiac Enzymes: No results for input(s): CKTOTAL, CKMB, CKMBINDEX, TROPONINI in the last 168 hours.  BNP (last 3 results) No results for input(s): BNP in the last 8760 hours.  ProBNP (last 3 results) No results for input(s): PROBNP in the last 8760 hours.  CBG: No results for input(s): GLUCAP in the last 168 hours.  Radiological Exams on Admission: CT ABDOMEN PELVIS W CONTRAST  Result Date: 06/10/2020 CLINICAL DATA:  Abdominal pain, tenderness and swelling, receiving chemotherapy for pancreatic cancer EXAM: CT ABDOMEN AND PELVIS WITH CONTRAST TECHNIQUE: Multidetector CT imaging of the abdomen and pelvis was performed using the standard protocol following bolus administration of intravenous contrast. CONTRAST:  175mL OMNIPAQUE IOHEXOL 300 MG/ML  SOLN COMPARISON:  CT chest, abdomen and pelvis 05/28/2020. FINDINGS: Lower chest: Redemonstration of the scattered sub 5 mm pulmonary nodules in both lower lobes, right middle lobe and lingula compatible with suspected metastatic disease. Lung bases are otherwise clear. Normal heart size. No pericardial effusion. Hepatobiliary: Redemonstration of multiple hepatic metastases demonstrated by some rim enhancement and central hypoattenuation, largest in the inferior right lobe liver measuring up to 3 cm which is not significantly changed in size from the comparison exam when measured at a similar level. Largest lesion in the left lobe liver measures up to 1.7 cm, also unchanged from prior. Few subcentimeter hypoattenuating foci present in the superior right lobe (2/11, 12) are better visualized than on  comparison. Normal hepatic attenuation. Smooth liver surface contour. No visible calcified gallstones. Mild gallbladder wall thickening, nonspecific given some adjacent ascites. No biliary ductal dilatation. Pancreas: Redemonstration of the heterogeneous, hypoattenuating 3.9 by 5.1 cm mass involving the pancreatic tail/body (2/32). Mass appears to encase the proximal celiac axis, splenic artery and vein as well as portion of the body of the left adrenal gland, overall similar in appearance to comparison study. Spleen: Normal in size. No concerning splenic lesions. Adrenals/Urinary Tract: Involvement of the left adrenal gland by the pancreatic mass, as detailed above. No concerning right adrenal nodules. Asymmetric scarring of the interpolar right kidney, similar to comparison. Few parapelvic cysts are noted. Nonobstructing calculus  present in the lower pole right kidney. No concerning renal masses. No urolithiasis or hydronephrosis. Urinary bladder is largely decompressed at the time of exam and therefore poorly evaluated by CT imaging. No gross bladder abnormality is evident. Some mild perivesicular hazy stranding is nonspecific however. Stomach/Bowel: Distal esophagus is unremarkable. There is some mild thickening at the gastric antrum with mucosal hyperemia, nonspecific and possibly related to normal peristalsis though could correlate for features of gastritis. No small bowel thickening or dilatation. Small air-filled retrocecal appendix. No colonic dilatation or wall thickening. Scattered colonic diverticula without focal inflammation to suggest diverticulitis. No clear mechanical obstruction. Vascular/Lymphatic: Atherosclerotic calcifications within the abdominal aorta and branch vessels. No aneurysm or ectasia. No discernible enlarged abdominopelvic lymph nodes. Reproductive: Post hysterectomy. Nodular changes along the superior vaginal cuff are again difficult to visualize though may be present (2/78). No  other concerning adnexal lesions. Other: Increasing peritoneal nodularity/omental caking with increasing moderate volume loculated abdominal ascites throughout the abdomen. No free intraperitoneal air. No bowel containing hernias. Mild body wall edema. Musculoskeletal: Multilevel degenerative changes are present in the imaged portions of the spine. No acute osseous abnormality or suspicious osseous lesion. IMPRESSION: 1. Increasing peritoneal nodularity/omental caking with increasing moderate volume possibly loculated malignant ascites throughout the abdomen. 2. Redemonstration of the heterogeneous, hypoattenuating mass involving the pancreatic tail/body with encasement of the proximal celiac axis, splenic artery and vein as well as portion of the body of the left adrenal gland. 3. Multiple rim enhancing, centrally hypoattenuating liver lesions compatible with hepatic metastatic disease. 4. Redemonstration of the scattered sub 5 mm pulmonary nodules in the lung bases, compatible with suspected metastatic disease. 5. Mild thickening at the gastric antrum with mucosal hyperemia, nonspecific and possibly related to normal peristalsis though could correlate for features of gastritis. 6. Nonobstructing right nephrolithiasis and right renal cortical scarring. 7. Mild perivesicular hazy stranding is nonspecific however recommend correlation with urinalysis to exclude infection. 8. Aortic Atherosclerosis (ICD10-I70.0). Electronically Signed   By: Lovena Le M.D.   On: 06/10/2020 20:54   DG Abdomen Acute W/Chest  Result Date: 06/10/2020 CLINICAL DATA:  59 year old with upper abdominal pain, tenderness and swelling. Current history of pancreatic cancer for which the patient is receiving chemotherapy. Current smoker. EXAM: ACUTE ABDOMEN SERIES (2 VIEW ABDOMEN AND 1 VIEW CHEST) COMPARISON:  CT chest, abdomen and pelvis 05/28/2020 and earlier. FINDINGS: Mild gaseous distension of the rectum in the transverse colon.  Moderate colonic stool burden. No evidence of small-bowel distention. No evidence of free air or significant air-fluid levels on the ERECT image. Phleboliths in both sides of the pelvis. No visible opaque urinary tract calculi. Degenerative changes involving the lower lumbar spine. Cardiomediastinal silhouette unremarkable. RIGHT jugular Port-A-Cath tip projects at or near the cavoatrial junction. Mild emphysematous changes throughout both lungs as noted on the prior CT. The lung nodules identified on CT are inconspicuous on the x-ray. No confluent or ground-glass airspace consolidation. No pleural effusions. IMPRESSION: 1. No evidence of bowel obstruction or free intraperitoneal air. 2. Moderate colonic stool burden. 3. Mild COPD/emphysema. No acute cardiopulmonary disease.  S Electronically Signed   By: Evangeline Dakin M.D.   On: 06/10/2020 16:41    EKG: I independently viewed the EKG done and my findings are as followed: No EKG was done in the ED  Assessment/Plan Present on Admission: . Abdominal pain . Pancreatic cancer metastasized to liver ([place]) . Pulmonary embolism (HCC)  Principal Problem:   Abdominal pain Active Problems:   Pancreatic cancer metastasized  to liver [facility])   Pulmonary embolism (HCC)   Hypoalbuminemia   Hyperglycemia   Abdominal pain in the setting of pancreatic cancer metastasized to liver Continue IV Dilaudid Continue IV Protonix Oncology will be consulted for further recommendation Interventional radiology will be consulted due to abdominal distention suspected to be secondary to loculated ascites to help determine need for paracentesis.  Patient will require time off Eliquis if paracentesis is indicated.   Hyperglycemia secondary to type 2 diabetes mellitus Continue insulin sliding scale and hypoglycemia protocol  Hypoalbuminemia secondary to mild protein calorie malnutrition Protein supplement will be provided when patient resumes oral intake  History of  pulmonary embolism Eliquis temporarily pending decision on need for ascites  Obesity (BMI 32.5) Patient will be counseled on diet and exercise when more stable  DVT prophylaxis: SCDs (home Eliquis temporarily held in aspirin for possible paracentesis)  Code Status: Full code  Family Communication: None at bedside  Disposition Plan:  Patient is from:                        home Anticipated DC to:                   SNF or family members home Anticipated DC date:               2-3 days Anticipated DC barriers:          Unstable to discharge at this time due to abdominal pain which was not relieved with outpatient pain medication.  Patient requiring further work-up regarding abdominal pain and distention   Consults called: Oncology, interventional radiology  Admission status: Inpatient    Bernadette Hoit MD [facility]  If 7PM-7AM, please contact night-coverage [email or web address] Password [facility]  06/11/2020, 4:44 AM

## 2020-06-11 NOTE — ED Notes (Signed)
Pt resting in bed.  Ambulatory to bathroom without difficulty.  Continues to c/o abdominal pain 09/10.  No visible distress.

## 2020-06-11 NOTE — Procedures (Signed)
INDICATION: Ascites  EXAM: ULTRASOUND GUIDED left PARACENTESIS  GRIP-IR: Category: Fluids    Subcategory: Paracentesis    Follow-Up: None    MEDICATIONS: None.  COMPLICATIONS: None immediate.  PROCEDURE: Informed written consent was obtained from the patient after a discussion of the risks (including hemorrhage, infection, and damage to adjacent structures, among others), benefits and alternatives to treatment. A timeout was performed prior to the initiation of the procedure.   Initial ultrasound scanning demonstrates a small amount of ascites within the left lower abdominal quadrant. The left lower abdomen was prepped and draped in the usual sterile fashion. 1% lidocaine  was used for local anesthesia.    Following this, a Yueh catheter was introduced. An ultrasound image was saved for documentation purposes. The paracentesis was performed but only 20 cc of serosanguineous fluid could be aspirated..  The catheter was removed and a dressing was applied. The patient tolerated the procedure well without immediate post procedural complication.    FINDINGS: A total of approximately 20 cc of serosanguineous fluid was removed. Samples were sent to the laboratory as requested by the clinical team.  IMPRESSION:  Ultrasound-guided paracentesis yielding 20 cc of peritoneal fluid.

## 2020-06-11 NOTE — Progress Notes (Signed)
PROGRESS NOTE  Doris [name]  DOB: 26-Feb-1961  PCP: Merdis Delay, DO OEU:[id number]  DOA: 06/10/2020  LOS: 1 day   Chief Complaint  Patient presents with  . Abdominal Pain    Brief narrative: Doris [name] is a 59 y.o. 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 Patient presented to the ED on 9/12 with progressively worsening abdominal pain for 3 weeks.  He has pancreatic cancer and follows up with oncologist Dr. Delton Coombes as an outpatient.  She was receiving chemotherapy every 2 weeks.  About a week ago, she was found to have metastases and hence she could not receive the previous chemo.  She states he is currently being worked up for a referral to [facility].   In the interval, patient started having worsening abdominal pain, distention, poor appetite and generalized weakness and hence presented to the ED.    In the ED, patient was intermittently tachycardic, hemodynamically stable.   IV Dilaudid given for pain.   Labs showed normal CBC and BMP except for hyperglycemia and hypoalbuminemia.   CT abdomen and pelvis with contrast showed an increasing peritoneal nodularity/omental caking with increasing moderate volume possibly loculated malignant ascites throughout the abdomen.  Hypoattenuating mass involving the pancreatic tail/body with encasement of the proximal celiac axis, splenic artery and vein as well as partial of the body of the left adrenal gland was noted.  Multiple rim enhancing, centrally hypoattenuating liver lesions compatible with hepatic metastatic disease was also noted.  Pulmonary nodules in the lung bases compatible with suspected metastatic disease was noted.   Patient was admitted to hospital service for further evaluation and management.  Oncology consultation was called.  Subjective: Patient was seen and examined this morning in ED. Lying in bed.  Pain partially controlled. No other  complaints.  No nausea, vomiting.  No bowel movement in last few days.  Assessment/Plan: Intractable abdominal pain -Presented with worsening abdominal pain in the setting of metastatic pancreatic cancer -Distended abdomen because of ascites. -Ultrasound paracentesis ordered -On MS Contin twice daily at home.  Resume the same.  Also on IV Dilaudid as needed.  Metastatic pancreatic cancer -CT abdomen pelvis as above showing increasing peritoneal nodularity, metastasis to liver as well as lungs.   -Oncology consultation awaited.  Poorly controlled diabetes mellitus type 2 Hyperglycemia -On NovoLog 20 minutes 3 times daily at home. -Currently on sliding scale insulin.  I would add 20 units of Lantus this morning. Lab Results  Component Value Date   HGBA1C 8.8 (H) 06/10/2020   No results for input(s): GLUCAP in the last 168 hours.  History of pulmonary embolism -On Eliquis.  Currently on hold for plan of paracentesis  Mobility: Independent at home.  Currently weak.  Ambulation expected to improve after paracentesis  Code Status:   Code Status: Full Code  Nutritional status: Body mass index is 32.5 kg/m.     Diet Order            Diet Carb Modified Fluid consistency: Thin; Room service appropriate? Yes  Diet effective now                 DVT prophylaxis: SCDs Start: 06/11/20 0031   Antimicrobials:  None Fluid: Current normal saline at 125 mL/h.  Reduce to 50 mill per hour. Consultants: Oncology Family Communication:  None at bedside  Status is: Inpatient  Remains inpatient appropriate because:Ongoing active pain requiring inpatient pain management, Ongoing diagnostic testing needed not  appropriate for outpatient work up and IV treatments appropriate due to intensity of illness or inability to take PO   Dispo: The patient is from: Home              Anticipated d/c is to: Home              Anticipated d/c date is: 2 days              Patient currently is not  medically stable to d/c.       Infusions:  . sodium chloride      Scheduled Meds: . insulin aspart  0-15 Units Subcutaneous Q4H  . insulin glargine  20 Units Subcutaneous Daily  . lisinopril  40 mg Oral Daily  . morphine  30 mg Oral Q12H  . pantoprazole (PROTONIX) IV  40 mg Intravenous Daily    Antimicrobials: Anti-infectives (From admission, onward)   None      PRN meds: HYDROmorphone (DILAUDID) injection, lactulose   Objective: Vitals:   06/11/20 0600 06/11/20 0800  BP: 110/62 (!) 148/85  Pulse: (!) 105 97  Resp: 18   Temp:    SpO2: 94% 96%    Intake/Output Summary (Last 24 hours) at 06/11/2020 0914 Last data filed at 06/11/2020 0549 Gross per 24 hour  Intake 2334.51 ml  Output --  Net 2334.51 ml   Filed Weights   06/10/20 1426  Weight: 105.7 kg   Weight change:  Body mass index is 32.5 kg/m.   Physical Exam: General exam:  Partially controlled pain Skin: No rashes, lesions or ulcers. HEENT: Atraumatic, normocephalic, supple neck, no obvious bleeding Lungs: Clear to auscultation bilaterally CVS: Regular rate and rhythm, no murmur GI/Abd distended abdomen from ascites,  CNS: Alert, awake, oriented x3 Psychiatry: Depressed look Extremities: Trace bilateral pedal edema  Data Review: I have personally reviewed the laboratory data and studies available.  Recent Labs  Lab 06/06/20 0926 06/10/20 1455 06/11/20 0104  WBC 6.0 6.1 5.6  NEUTROABS 3.7  --   --   HGB 13.2 13.9 12.6  HCT 42.4 44.2 40.2  MCV 76.8* 76.5* 76.9*  PLT 299 346 314   Recent Labs  Lab 06/06/20 0926 06/10/20 1455 06/11/20 0104  NA 136 135 134*  K 3.8 4.7 4.0  CL 95* 95* 97*  CO2 28 28 28   GLUCOSE 206* 229* 162*  BUN 8 10 8   CREATININE 0.72 0.69 0.66  CALCIUM 8.9 9.5 8.7*    F/u labs ordered  Signed, Terrilee Croak, MD [facility] 06/11/2020

## 2020-06-12 ENCOUNTER — Encounter (HOSPITAL_COMMUNITY): Payer: Self-pay

## 2020-06-12 DIAGNOSIS — R180 Malignant ascites: Secondary | ICD-10-CM

## 2020-06-12 DIAGNOSIS — C259 Malignant neoplasm of pancreas, unspecified: Principal | ICD-10-CM

## 2020-06-12 DIAGNOSIS — C787 Secondary malignant neoplasm of liver and intrahepatic bile duct: Secondary | ICD-10-CM

## 2020-06-12 DIAGNOSIS — R109 Unspecified abdominal pain: Secondary | ICD-10-CM

## 2020-06-12 DIAGNOSIS — I2699 Other pulmonary embolism without acute cor pulmonale: Secondary | ICD-10-CM

## 2020-06-12 LAB — CBC WITH DIFFERENTIAL/PLATELET
Abs Immature Granulocytes: 0 10*3/uL (ref 0.00–0.07)
Basophils Absolute: 0 10*3/uL (ref 0.0–0.1)
Basophils Relative: 1 %
Eosinophils Absolute: 0.3 10*3/uL (ref 0.0–0.5)
Eosinophils Relative: 5 %
HCT: 41.2 % (ref 36.0–46.0)
Hemoglobin: 12.7 g/dL (ref 12.0–15.0)
Immature Granulocytes: 0 %
Lymphocytes Relative: 24 %
Lymphs Abs: 1.2 10*3/uL (ref 0.7–4.0)
MCH: 23.7 pg — ABNORMAL LOW (ref 26.0–34.0)
MCHC: 30.8 g/dL (ref 30.0–36.0)
MCV: 76.9 fL — ABNORMAL LOW (ref 80.0–100.0)
Monocytes Absolute: 0.6 10*3/uL (ref 0.1–1.0)
Monocytes Relative: 13 %
Neutro Abs: 2.7 10*3/uL (ref 1.7–7.7)
Neutrophils Relative %: 57 %
Platelets: 301 10*3/uL (ref 150–400)
RBC: 5.36 MIL/uL — ABNORMAL HIGH (ref 3.87–5.11)
RDW: 17.4 % — ABNORMAL HIGH (ref 11.5–15.5)
WBC: 4.8 10*3/uL (ref 4.0–10.5)
nRBC: 0 % (ref 0.0–0.2)

## 2020-06-12 LAB — COMPREHENSIVE METABOLIC PANEL
ALT: 11 U/L (ref 0–44)
AST: 14 U/L — ABNORMAL LOW (ref 15–41)
Albumin: 3 g/dL — ABNORMAL LOW (ref 3.5–5.0)
Alkaline Phosphatase: 57 U/L (ref 38–126)
Anion gap: 10 (ref 5–15)
BUN: 7 mg/dL (ref 6–20)
CO2: 26 mmol/L (ref 22–32)
Calcium: 8.8 mg/dL — ABNORMAL LOW (ref 8.9–10.3)
Chloride: 99 mmol/L (ref 98–111)
Creatinine, Ser: 0.64 mg/dL (ref 0.44–1.00)
GFR calc Af Amer: >60 mL/min (ref >60)
GFR calc non Af Amer: >60 mL/min (ref >60)
Glucose, Bld: 118 mg/dL — ABNORMAL HIGH (ref 70–99)
Potassium: 4.1 mmol/L (ref 3.5–5.1)
Sodium: 135 mmol/L (ref 135–145)
Total Bilirubin: 0.7 mg/dL (ref 0.3–1.2)
Total Protein: 5.9 g/dL — ABNORMAL LOW (ref 6.5–8.1)

## 2020-06-12 LAB — GLUCOSE, CAPILLARY
Glucose-Capillary: 100 mg/dL — ABNORMAL HIGH (ref 70–99)
Glucose-Capillary: 124 mg/dL — ABNORMAL HIGH (ref 70–99)
Glucose-Capillary: 125 mg/dL — ABNORMAL HIGH (ref 70–99)
Glucose-Capillary: 137 mg/dL — ABNORMAL HIGH (ref 70–99)
Glucose-Capillary: 139 mg/dL — ABNORMAL HIGH (ref 70–99)
Glucose-Capillary: 145 mg/dL — ABNORMAL HIGH (ref 70–99)

## 2020-06-12 LAB — PHOSPHORUS: Phosphorus: 3.6 mg/dL (ref 2.5–4.6)

## 2020-06-12 LAB — MAGNESIUM: Magnesium: 2 mg/dL (ref 1.7–2.4)

## 2020-06-12 LAB — CYTOLOGY - NON PAP

## 2020-06-12 MED ORDER — OXYCODONE-ACETAMINOPHEN 7.5-325 MG PO TABS
1.0000 | ORAL_TABLET | Freq: Three times a day (TID) | ORAL | Status: AC | PRN
  Administered 2020-06-12: 1 via ORAL
  Filled 2020-06-12: qty 1

## 2020-06-12 MED ORDER — CHLORHEXIDINE GLUCONATE CLOTH 2 % EX PADS
6.0000 | MEDICATED_PAD | Freq: Every day | CUTANEOUS | Status: DC
  Administered 2020-06-13: 6 via TOPICAL

## 2020-06-12 MED ORDER — DIPHENHYDRAMINE HCL 25 MG PO CAPS
25.0000 mg | ORAL_CAPSULE | Freq: Four times a day (QID) | ORAL | Status: DC | PRN
  Administered 2020-06-12: 25 mg via ORAL
  Filled 2020-06-12: qty 1

## 2020-06-12 MED ORDER — HYDROMORPHONE HCL 1 MG/ML IJ SOLN
2.0000 mg | INTRAMUSCULAR | Status: DC | PRN
  Administered 2020-06-12 – 2020-06-13 (×9): 2 mg via INTRAVENOUS
  Filled 2020-06-12 (×9): qty 2

## 2020-06-12 MED ORDER — FENTANYL 25 MCG/HR TD PT72
1.0000 | MEDICATED_PATCH | TRANSDERMAL | Status: DC
  Administered 2020-06-12: 1 via TRANSDERMAL
  Filled 2020-06-12: qty 1

## 2020-06-12 MED ORDER — INSULIN GLARGINE 100 UNIT/ML ~~LOC~~ SOLN
10.0000 [IU] | Freq: Every day | SUBCUTANEOUS | Status: DC
  Administered 2020-06-13 – 2020-06-14 (×2): 10 [IU] via SUBCUTANEOUS
  Filled 2020-06-12 (×3): qty 0.1

## 2020-06-12 MED ORDER — FLEET ENEMA 7-19 GM/118ML RE ENEM: 1.0000 | ENEMA | Freq: Every day | RECTAL | Status: DC | PRN

## 2020-06-12 NOTE — Progress Notes (Signed)
PROGRESS NOTE  Doris [name]  DOB: Feb 21, 1961  PCP: Merdis Delay, DO NAT:[id number]  DOA: 06/10/2020  LOS: 2 days   Chief Complaint  Patient presents with  . Abdominal Pain    Brief narrative: Doris [name] is a 59 y.o. 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 Patient presented to the ED on 9/12 with progressively worsening abdominal pain for 3 weeks.  He has pancreatic cancer and follows up with oncologist Dr. Delton Coombes as an outpatient.  She was receiving chemotherapy every 2 weeks.  About a week ago, she was found to have metastases and hence she could not receive the previous chemo.  She states he is currently being worked up for a referral to [facility].   In the interval, patient started having worsening abdominal pain, distention, poor appetite and generalized weakness and hence presented to the ED.    In the ED, patient was intermittently tachycardic, hemodynamically stable.   IV Dilaudid given for pain.   Labs showed normal CBC and BMP except for hyperglycemia and hypoalbuminemia.   CT abdomen and pelvis with contrast showed an increasing peritoneal nodularity/omental caking with increasing moderate volume possibly loculated malignant ascites throughout the abdomen.  Hypoattenuating mass involving the pancreatic tail/body with encasement of the proximal celiac axis, splenic artery and vein as well as partial of the body of the left adrenal gland was noted.  Multiple rim enhancing, centrally hypoattenuating liver lesions compatible with hepatic metastatic disease was also noted.  Pulmonary nodules in the lung bases compatible with suspected metastatic disease was noted.   Patient was admitted to hospital service for further evaluation and management.  Oncology consultation was called.  Subjective: Patient was seen and examined this morning. Continues to complain of abdominal pain.  Anorexia.     Yesterday, IR was able to tap only 20 mL of acetic fluid.  Assessment/Plan: Intractable abdominal pain Malignant ascites -Presented with worsening abdominal pain in the setting of metastatic pancreatic cancer -Distended abdomen because of ascites. -Ultrasound paracentesis obtained on 9/13 but only was able to remove 20 mL of fluid. -May need for paracentesis.  Will discuss with oncology and IR. -Currently patient is on pain control with MS Contin twice daily, IV Dilaudid.  May benefit from fentanyl patch.  Will discuss with oncology.   Metastatic pancreatic cancer -CT abdomen pelvis as above showing increasing peritoneal nodularity, metastasis to liver as well as lungs.   -Oncology consultation needed today.  Poorly controlled diabetes mellitus type 2 Hyperglycemia -On NovoLog 20 minutes 3 times daily at home. -Currently on Lantus 20 units daily and sliding scale insulin. Since her oral intake is really poor, I would reduce Lantus dose to 10 units daily.  Lab Results  Component Value Date   HGBA1C 8.8 (H) 06/10/2020   Recent Labs  Lab 06/11/20 2226 06/12/20 0031 06/12/20 0358 06/12/20 0800 06/12/20 1121  GLUCAP 113* 137* 145* 125* 124*    History of pulmonary embolism -On Eliquis.  Currently on hold.  May need for paracentesis.   Mobility: Independent at home.  Currently weak.  Ambulation expected to improve after paracentesis  Code Status:   Code Status: Full Code  Nutritional status: Body mass index is 32.5 kg/m.     Diet Order            Diet Carb Modified Fluid consistency: Thin; Room service appropriate? Yes  Diet effective now  DVT prophylaxis: SCDs Start: 06/11/20 0031   Antimicrobials:  None Fluid: Current normal saline at 50 mill per hour. Consultants: Oncology Family Communication:  None at bedside  Status is: Inpatient  Remains inpatient appropriate because:Ongoing active pain requiring inpatient pain management, Ongoing  diagnostic testing needed not appropriate for outpatient work up and IV treatments appropriate due to intensity of illness or inability to take PO   Dispo: The patient is from: Home              Anticipated d/c is to: Home              Anticipated d/c date is: 2 days              Patient currently is not medically stable to d/c.       Infusions:  . sodium chloride 1,000 mL (06/12/20 0548)    Scheduled Meds: . feeding supplement (ENSURE ENLIVE)  237 mL Oral BID BM  . insulin aspart  0-15 Units Subcutaneous Q4H  . [START ON 06/13/2020] insulin glargine  10 Units Subcutaneous Daily  . lisinopril  40 mg Oral Daily  . morphine  30 mg Oral Q12H  . pantoprazole (PROTONIX) IV  40 mg Intravenous Daily    Antimicrobials: Anti-infectives (From admission, onward)   None      PRN meds: HYDROmorphone (DILAUDID) injection, lactulose, oxyCODONE-acetaminophen   Objective: Vitals:   06/12/20 0400 06/12/20 0900  BP: (!) 142/67 (!) 116/59  Pulse: (!) 106 (!) 108  Resp: 20 14  Temp: 98.2 F (36.8 C) 98 F (36.7 C)  SpO2: 97% 100%    Intake/Output Summary (Last 24 hours) at 06/12/2020 1144 Last data filed at 06/12/2020 0548 Gross per 24 hour  Intake 906.15 ml  Output --  Net 906.15 ml   Filed Weights   06/10/20 1426  Weight: 105.7 kg   Weight change:  Body mass index is 32.5 kg/m.   Physical Exam: General exam:  Partially controlled pain Skin: No rashes, lesions or ulcers. HEENT: Atraumatic, normocephalic, supple neck, no obvious bleeding Lungs: Clear to auscultation bilaterally CVS: Regular rate and rhythm, no murmur GI/Abd continues to have distended, tender abdomen  CNS: Alert, awake, oriented x3 Psychiatry: Depressed look Extremities: Trace bilateral pedal edema  Data Review: I have personally reviewed the laboratory data and studies available.  Recent Labs  Lab 06/06/20 0926 06/10/20 1455 06/11/20 0104 06/12/20 0613  WBC 6.0 6.1 5.6 4.8  NEUTROABS 3.7  --    --  2.7  HGB 13.2 13.9 12.6 12.7  HCT 42.4 44.2 40.2 41.2  MCV 76.8* 76.5* 76.9* 76.9*  PLT 299 346 314 301   Recent Labs  Lab 06/06/20 0926 06/10/20 1455 06/11/20 0104 06/12/20 0613  NA 136 135 134* 135  K 3.8 4.7 4.0 4.1  CL 95* 95* 97* 99  CO2 28 28 28 26   GLUCOSE 206* 229* 162* 118*  BUN 8 10 8 7   CREATININE 0.72 0.69 0.66 0.64  CALCIUM 8.9 9.5 8.7* 8.8*  MG  --   --   --  2.0  PHOS  --   --   --  3.6    F/u labs ordered  Signed, Terrilee Croak, MD [facility] 06/12/2020

## 2020-06-12 NOTE — Consult Note (Signed)
[facility] Consultation Oncology  Name: Doris [name]      MRN: [id number]    Location: C144/Y185-63  Date: 06/12/2020 Time:6:31 PM   REFERRING PHYSICIAN: Dr. Pietro Cassis  REASON FOR CONSULT: Metastatic pancreatic cancer   DIAGNOSIS: Abdominal distention with ascites and pain  HISTORY OF PRESENT ILLNESS: Doris [name] is a 59 year old African-American female well-known to me from office visits.  She was seen by me on 06/06/2020 in the office.  At that time CT scan from 05/28/2020 showed increase in size of the lung nodules, multiple liver metastasis.  I have recommended change in chemotherapy.  She is due to start her chemotherapy tomorrow.  However she came to the ER on Sunday with abdominal pain and decreased eating.  She was started on MS Contin 30 mg every 12 hours when she saw me on 06/06/2020.  She is also taking Percocet 10 mg 4-6 times a day.  CT scan of the abdomen and pelvis on 06/10/2020 showed increasing loculated abdominal ascites with peritoneal nodularity.  She had ultrasound-guided paracentesis done yesterday, only 20 cc of serosanguineous fluid could be aspirated.  She is having difficulty controlling her pain.  She is receiving Dilaudid 1 mg every 4 hours.  She reports that it is only lasting about 2 to 3 hours.  She was also started on fentanyl patch 25 mcg this afternoon at 1 PM.  She is also receiving MS Contin 30 mg every 12 hours.  She is also taking Percocet 2 tablets every 4-6 hours.  She reports that she has not been able to eat well because of decreased appetite and abdominal pain.  She just took a Fleet enema but did not have any bowel movement yet.  PAST MEDICAL HISTORY:   Past Medical History:  Diagnosis Date  . Diabetes mellitus without complication ([place])   . Family history of prostate cancer   . Family history of stomach cancer   . Hypertension   . Pancreatic cancer ([place])   . Port-A-Cath in place 05/16/2019    ALLERGIES: Allergies  Allergen Reactions  . Hydrocodone  Hives  . Tramadol Itching      MEDICATIONS: I have reviewed the patient's current medications.     PAST SURGICAL HISTORY Past Surgical History:  Procedure Laterality Date  . ABDOMINAL HYSTERECTOMY    . BACK SURGERY    . KIDNEY SURGERY     per pt, had leakage  . tubal ligation Left     FAMILY HISTORY: Family History  Problem Relation Age of Onset  . Diabetes Mother   . Hypertension Mother   . Lung cancer Mother 53       d. 17  . Diabetes Father   . Hypertension Father   . Heart disease Brother   . Stomach cancer Paternal Aunt 39  . Stroke Paternal Grandmother   . Prostate cancer Other        PGMs brother  . Stomach cancer Other        PGMs mother    SOCIAL HISTORY:  reports that she has been smoking cigarettes. She has a 45.00 pack-year smoking history. She has never used smokeless tobacco. She reports previous alcohol use. She reports that she does not use drugs.  PERFORMANCE STATUS: The patient's performance status is 2 - Symptomatic, <50% confined to bed  PHYSICAL EXAM: Most Recent Vital Signs: Blood pressure 104/82, pulse (!) 102, temperature 98 F (36.7 C), temperature source Oral, resp. rate 14, height 5\' 11"  (1.803 m), weight 233 lb (  105.7 kg), SpO2 100 %. BP 104/82 (BP Location: Right Arm)   Pulse (!) 102   Temp 98 F (36.7 C) (Oral)   Resp 14   Ht 5\' 11"  (1.803 m)   Wt 233 lb (105.7 kg)   SpO2 100%   BMI 32.50 kg/m  General appearance: alert, cooperative and appears stated age Lungs: clear to auscultation bilaterally Heart: regular rate and rhythm Abdomen: Tense, distended with generalized tenderness. Extremities: No edema or cyanosis. Skin: Skin color, texture, turgor normal. No rashes or lesions Neurologic: Grossly normal  LABORATORY DATA:  Results for orders placed or performed during the hospital encounter of 06/10/20 (from the past 48 hour(s))  SARS Coronavirus 2 by RT PCR (hospital order, performed in [facility] hospital lab)  Nasopharyngeal Nasopharyngeal Swab     Status: None   Collection Time: 06/10/20 11:16 PM   Specimen: Nasopharyngeal Swab  Result Value Ref Range   SARS Coronavirus 2 NEGATIVE NEGATIVE    Comment: (NOTE) SARS-CoV-2 target nucleic acids are NOT DETECTED.  The SARS-CoV-2 RNA is generally detectable in upper and lower respiratory specimens during the acute phase of infection. The lowest concentration of SARS-CoV-2 viral copies this assay can detect is 250 copies / mL. A negative result does not preclude SARS-CoV-2 infection and should not be used as the sole basis for treatment or other patient management decisions.  A negative result may occur with improper specimen collection / handling, submission of specimen other than nasopharyngeal swab, presence of viral mutation(s) within the areas targeted by this assay, and inadequate number of viral copies (<250 copies / mL). A negative result must be combined with clinical observations, patient history, and epidemiological information.  Fact Sheet for Patients:   StrictlyIdeas.no  Fact Sheet for Healthcare Providers: BankingDealers.co.za  This test is not yet approved or  cleared by the [place] FDA and has been authorized for detection and/or diagnosis of SARS-CoV-2 by FDA under an Emergency Use Authorization (EUA).  This EUA will remain in effect (meaning this test can be used) for the duration of the COVID-19 declaration under Section 564(b)(1) of the Act, 21 U.S.C. section 360bbb-3(b)(1), unless the authorization is terminated or revoked sooner.  Performed at [facility], [address], [place], [place] [address]   HIV Antibody (routine testing w rflx)     Status: None   Collection Time: 06/11/20  1:04 AM  Result Value Ref Range   HIV Screen 4th Generation wRfx Non Reactive Non Reactive    Comment: Performed at [facility], [place] [address] [place], [place] [address]   Comprehensive metabolic panel     Status: Abnormal   Collection Time: 06/11/20  1:04 AM  Result Value Ref Range   Sodium 134 (L) 135 - 145 mmol/L   Potassium 4.0 3.5 - 5.1 mmol/L   Chloride 97 (L) 98 - 111 mmol/L   CO2 28 22 - 32 mmol/L   Glucose, Bld 162 (H) 70 - 99 mg/dL    Comment: Glucose reference range applies only to samples taken after fasting for at least 8 hours.   BUN 8 6 - 20 mg/dL   Creatinine, Ser 0.66 0.44 - 1.00 mg/dL   Calcium 8.7 (L) 8.9 - 10.3 mg/dL   Total Protein 6.0 (L) 6.5 - 8.1 g/dL   Albumin 3.0 (L) 3.5 - 5.0 g/dL   AST 14 (L) 15 - 41 U/L   ALT 12 0 - 44 U/L   Alkaline Phosphatase 60 38 - 126 U/L  Total Bilirubin 0.8 0.3 - 1.2 mg/dL   GFR calc non Af Amer >60 >60 mL/min   GFR calc Af Amer >60 >60 mL/min   Anion gap 9 5 - 15    Comment: Performed at [facility], [address], [place], [place] [address]  CBC     Status: Abnormal   Collection Time: 06/11/20  1:04 AM  Result Value Ref Range   WBC 5.6 4.0 - 10.5 K/uL   RBC 5.23 (H) 3.87 - 5.11 MIL/uL   Hemoglobin 12.6 12.0 - 15.0 g/dL   HCT 40.2 36 - 46 %   MCV 76.9 (L) 80.0 - 100.0 fL   MCH 24.1 (L) 26.0 - 34.0 pg   MCHC 31.3 30.0 - 36.0 g/dL   RDW 18.0 (H) 11.5 - 15.5 %   Platelets 314 150 - 400 K/uL   nRBC 0.0 0.0 - 0.2 %    Comment: Performed at [facility], [address], [place], [place] [address]  Protime-INR     Status: None   Collection Time: 06/11/20  1:04 AM  Result Value Ref Range   Prothrombin Time 14.8 11.4 - 15.2 seconds   INR 1.2 0.8 - 1.2    Comment: (NOTE) INR goal varies based on device and disease states. Performed at [facility], [address], [place], [place] [address]   APTT     Status: None   Collection Time: 06/11/20  1:04 AM  Result Value Ref Range   aPTT 34 24 - 36 seconds    Comment: Performed at [facility], [address], [place], [place] [address]  CBG monitoring, ED     Status: Abnormal   Collection Time: 06/11/20  9:25 AM  Result Value Ref Range    Glucose-Capillary 161 (H) 70 - 99 mg/dL    Comment: Glucose reference range applies only to samples taken after fasting for at least 8 hours.  Cytology - Non PAP;     Status: None   Collection Time: 06/11/20  9:30 AM  Result Value Ref Range   CYTOLOGY - NON GYN      CYTOLOGY - NON PAP CASE: [id number] PATIENT: Simon Hunsucker Non-Gynecological Cytology Report     Clinical History: Metastatic malignant neoplasm; metastatic pancreatic adenocarcinoma to the liver Specimen Submitted:  A. ASCITES, PARACENTESIS:   FINAL MICROSCOPIC DIAGNOSIS: - Malignant cells consistent with metastatic adenocarcinoma  SPECIMEN ADEQUACY: Satisfactory for evaluation  GROSS: Received is/are 20 ccs of red fluid. (CM:cm) Prepared: Smears:  0 Concentration Method (Thin Prep):  1 Cell Block:  1 Additional Studies:  n/a     Final Diagnosis performed by Jaquita Folds, MD.   Electronically signed 06/12/2020 Technical component performed at [facility], [place] [address], [place], [place] [address].  Professional component performed at [facility], [place] [address], [place], [place] [address].  Immunohistochemistry Technical component (if applicable) was performed at [facility]. [address], [place], [place], [place] [address].   IMMUNOHISTOCHEMISTRY DISCLAIMER (if applicable): Some of these immunohistochemical stains may have been developed and the performance characteristics determine by [facility]. Some may not have been cleared or approved by the U.S. Food and Drug Administration. The FDA has determined that such clearance or approval is not necessary. This test is used for clinical purposes. It should not be regarded as investigational or for research. This laboratory is certified under the [place] (CLIA-88) as qualified to perform high complexity clinical  laboratory testing.  The controls stained appropriately.   CBG monitoring, ED     Status: Abnormal   Collection Time: 06/11/20 12:32 PM  Result Value Ref Range   Glucose-Capillary 152 (H) 70 - 99 mg/dL    Comment: Glucose reference range applies only to samples taken after fasting for at least 8 hours.  CBG monitoring, ED     Status: Abnormal   Collection Time: 06/11/20  4:02 PM  Result Value Ref Range   Glucose-Capillary 153 (H) 70 - 99 mg/dL    Comment: Glucose reference range applies only to samples taken after fasting for at least 8 hours.  CBG monitoring, ED     Status: Abnormal   Collection Time: 06/11/20  8:34 PM  Result Value Ref Range   Glucose-Capillary 115 (H) 70 - 99 mg/dL    Comment: Glucose reference range applies only to samples taken after fasting for at least 8 hours.  Glucose, capillary     Status: Abnormal   Collection Time: 06/11/20 10:26 PM  Result Value Ref Range   Glucose-Capillary 113 (H) 70 - 99 mg/dL    Comment: Glucose reference range applies only to samples taken after fasting for at least 8 hours.  Glucose, capillary     Status: Abnormal   Collection Time: 06/12/20 12:31 AM  Result Value Ref Range   Glucose-Capillary 137 (H) 70 - 99 mg/dL    Comment: Glucose reference range applies only to samples taken after fasting for at least 8 hours.  Glucose, capillary     Status: Abnormal   Collection Time: 06/12/20  3:58 AM  Result Value Ref Range   Glucose-Capillary 145 (H) 70 - 99 mg/dL    Comment: Glucose reference range applies only to samples taken after fasting for at least 8 hours.  Comprehensive metabolic panel     Status: Abnormal   Collection Time: 06/12/20  6:13 AM  Result Value Ref Range   Sodium 135 135 - 145 mmol/L   Potassium 4.1 3.5 - 5.1 mmol/L   Chloride 99 98 - 111 mmol/L   CO2 26 22 - 32 mmol/L   Glucose, Bld 118 (H) 70 - 99 mg/dL    Comment: Glucose reference range applies only to samples taken after fasting for at least 8 hours.    BUN 7 6 - 20 mg/dL   Creatinine, Ser 0.64 0.44 - 1.00 mg/dL   Calcium 8.8 (L) 8.9 - 10.3 mg/dL   Total Protein 5.9 (L) 6.5 - 8.1 g/dL   Albumin 3.0 (L) 3.5 - 5.0 g/dL   AST 14 (L) 15 - 41 U/L   ALT 11 0 - 44 U/L   Alkaline Phosphatase 57 38 - 126 U/L   Total Bilirubin 0.7 0.3 - 1.2 mg/dL   GFR calc non Af Amer >60 >60 mL/min   GFR calc Af Amer >60 >60 mL/min   Anion gap 10 5 - 15    Comment: Performed at [facility], [address], [place],  [address]  CBC with Differential/Platelet     Status: Abnormal   Collection Time: 06/12/20  6:13 AM  Result Value Ref Range   WBC 4.8 4.0 - 10.5 K/uL   RBC 5.36 (H) 3.87 - 5.11 MIL/uL   Hemoglobin 12.7 12.0 - 15.0 g/dL   HCT 41.2 36 - 46 %   MCV 76.9 (L) 80.0 - 100.0 fL   MCH 23.7 (L) 26.0 - 34.0 pg   MCHC 30.8 30.0 - 36.0 g/dL   RDW  17.4 (H) 11.5 - 15.5 %   Platelets 301 150 - 400 K/uL   nRBC 0.0 0.0 - 0.2 %   Neutrophils Relative % 57 %   Neutro Abs 2.7 1.7 - 7.7 K/uL   Lymphocytes Relative 24 %   Lymphs Abs 1.2 0.7 - 4.0 K/uL   Monocytes Relative 13 %   Monocytes Absolute 0.6 0 - 1 K/uL   Eosinophils Relative 5 %   Eosinophils Absolute 0.3 0 - 0 K/uL   Basophils Relative 1 %   Basophils Absolute 0.0 0 - 0 K/uL   Immature Granulocytes 0 %   Abs Immature Granulocytes 0.00 0.00 - 0.07 K/uL    Comment: Performed at [facility], [address] [place], [place] [address]  Phosphorus     Status: None   Collection Time: 06/12/20  6:13 AM  Result Value Ref Range   Phosphorus 3.6 2.5 - 4.6 mg/dL    Comment: Performed at [facility], [address], [place], [place] [address]  Magnesium     Status: None   Collection Time: 06/12/20  6:13 AM  Result Value Ref Range   Magnesium 2.0 1.7 - 2.4 mg/dL    Comment: Performed at [facility], [address], [place],  [address]  Glucose, capillary     Status: Abnormal   Collection Time: 06/12/20  8:00 AM  Result Value Ref Range   Glucose-Capillary 125 (H) 70 - 99 mg/dL     Comment: Glucose reference range applies only to samples taken after fasting for at least 8 hours.  Glucose, capillary     Status: Abnormal   Collection Time: 06/12/20 11:21 AM  Result Value Ref Range   Glucose-Capillary 124 (H) 70 - 99 mg/dL    Comment: Glucose reference range applies only to samples taken after fasting for at least 8 hours.  Glucose, capillary     Status: Abnormal   Collection Time: 06/12/20  5:09 PM  Result Value Ref Range   Glucose-Capillary 139 (H) 70 - 99 mg/dL    Comment: Glucose reference range applies only to samples taken after fasting for at least 8 hours.      RADIOGRAPHY: CT ABDOMEN PELVIS W CONTRAST  Result Date: 06/10/2020 CLINICAL DATA:  Abdominal pain, tenderness and swelling, receiving chemotherapy for pancreatic cancer EXAM: CT ABDOMEN AND PELVIS WITH CONTRAST TECHNIQUE: Multidetector CT imaging of the abdomen and pelvis was performed using the standard protocol following bolus administration of intravenous contrast. CONTRAST:  175mL OMNIPAQUE IOHEXOL 300 MG/ML  SOLN COMPARISON:  CT chest, abdomen and pelvis 05/28/2020. FINDINGS: Lower chest: Redemonstration of the scattered sub 5 mm pulmonary nodules in both lower lobes, right middle lobe and lingula compatible with suspected metastatic disease. Lung bases are otherwise clear. Normal heart size. No pericardial effusion. Hepatobiliary: Redemonstration of multiple hepatic metastases demonstrated by some rim enhancement and central hypoattenuation, largest in the inferior right lobe liver measuring up to 3 cm which is not significantly changed in size from the comparison exam when measured at a similar level. Largest lesion in the left lobe liver measures up to 1.7 cm, also unchanged from prior. Few subcentimeter hypoattenuating foci present in the superior right lobe (2/11, 12) are better visualized than on comparison. Normal hepatic attenuation. Smooth liver surface contour. No visible calcified gallstones. Mild  gallbladder wall thickening, nonspecific given some adjacent ascites. No biliary ductal dilatation. Pancreas: Redemonstration of the heterogeneous, hypoattenuating 3.9 by 5.1 cm mass involving the pancreatic tail/body (2/32). Mass appears to encase the  proximal celiac axis, splenic artery and vein as well as portion of the body of the left adrenal gland, overall similar in appearance to comparison study. Spleen: Normal in size. No concerning splenic lesions. Adrenals/Urinary Tract: Involvement of the left adrenal gland by the pancreatic mass, as detailed above. No concerning right adrenal nodules. Asymmetric scarring of the interpolar right kidney, similar to comparison. Few parapelvic cysts are noted. Nonobstructing calculus present in the lower pole right kidney. No concerning renal masses. No urolithiasis or hydronephrosis. Urinary bladder is largely decompressed at the time of exam and therefore poorly evaluated by CT imaging. No gross bladder abnormality is evident. Some mild perivesicular hazy stranding is nonspecific however. Stomach/Bowel: Distal esophagus is unremarkable. There is some mild thickening at the gastric antrum with mucosal hyperemia, nonspecific and possibly related to normal peristalsis though could correlate for features of gastritis. No small bowel thickening or dilatation. Small air-filled retrocecal appendix. No colonic dilatation or wall thickening. Scattered colonic diverticula without focal inflammation to suggest diverticulitis. No clear mechanical obstruction. Vascular/Lymphatic: Atherosclerotic calcifications within the abdominal aorta and branch vessels. No aneurysm or ectasia. No discernible enlarged abdominopelvic lymph nodes. Reproductive: Post hysterectomy. Nodular changes along the superior vaginal cuff are again difficult to visualize though may be present (2/78). No other concerning adnexal lesions. Other: Increasing peritoneal nodularity/omental caking with increasing  moderate volume loculated abdominal ascites throughout the abdomen. No free intraperitoneal air. No bowel containing hernias. Mild body wall edema. Musculoskeletal: Multilevel degenerative changes are present in the imaged portions of the spine. No acute osseous abnormality or suspicious osseous lesion. IMPRESSION: 1. Increasing peritoneal nodularity/omental caking with increasing moderate volume possibly loculated malignant ascites throughout the abdomen. 2. Redemonstration of the heterogeneous, hypoattenuating mass involving the pancreatic tail/body with encasement of the proximal celiac axis, splenic artery and vein as well as portion of the body of the left adrenal gland. 3. Multiple rim enhancing, centrally hypoattenuating liver lesions compatible with hepatic metastatic disease. 4. Redemonstration of the scattered sub 5 mm pulmonary nodules in the lung bases, compatible with suspected metastatic disease. 5. Mild thickening at the gastric antrum with mucosal hyperemia, nonspecific and possibly related to normal peristalsis though could correlate for features of gastritis. 6. Nonobstructing right nephrolithiasis and right renal cortical scarring. 7. Mild perivesicular hazy stranding is nonspecific however recommend correlation with urinalysis to exclude infection. 8. Aortic Atherosclerosis (ICD10-I70.0). Electronically Signed   By: Lovena Le M.D.   On: 06/10/2020 20:54   US Paracentesis  Result Date: 06/11/2020 INDICATION: Ascites EXAM: ULTRASOUND GUIDED left PARACENTESIS MEDICATIONS: None. COMPLICATIONS: None immediate. PROCEDURE: Informed written consent was obtained from the patient after a discussion of the risks (including hemorrhage, infection, and damage to adjacent structures, among others), benefits and alternatives to treatment. A timeout was performed prior to the initiation of the procedure. Initial ultrasound scanning demonstrates a small amount of ascites within the left lower abdominal  quadrant. The left lower abdomen was prepped and draped in the usual sterile fashion. 1% lidocaine was used for local anesthesia. Following this, a Yueh catheter was introduced. An ultrasound image was saved for documentation purposes. The paracentesis was performed but only 20 cc of serosanguineous fluid could be aspirated. The catheter was removed and a dressing was applied. The patient tolerated the procedure well without immediate post procedural complication. FINDINGS: A total of approximately 20 cc of serosanguineous fluid was removed. Samples were sent to the laboratory as requested by the clinical team. IMPRESSION: Ultrasound-guided paracentesis yielding 20 cc of peritoneal fluid. Electronically  Signed   By: Van Clines M.D.   On: 06/11/2020 12:08        ASSESSMENT and PLAN:  1.  Severe abdominal pain: -Abdominal pain from peritoneal/omental involvement, with distention and ascites. -Current regimen is poorly controlling her pain. -Fentanyl patch 25 mcg started around 1 PM. -Morphine sulfate 30 mg every 12 hours started last week, not helping.  Hence we will discontinue it. -We will discontinue Percocet also. -Will increase Dilaudid to 2 mg every 3 hours as needed for breakthrough pain.  2.  Nutrition: -She does not have good appetite and hence not able to eat.  Denies any nausea or vomiting. -Recommended Ensure +4 to 5 cans/day.  3.  Metastatic pancreatic cancer: -I have reviewed her CT abdomen and pelvis from 06/10/2020.  Compared to scan from 05/28/2020, there is increase in ascites. -Paracentesis on 06/11/2020 was able to yield only 20 cc of serosanguineous fluid as her ascites is loculated. -She tells me that she does not want to give up yet.  She also told me that she is not ready for hospice at this time. -I have told her that we will try to get her pain under control.  She should be able to eat or drink Ensure 4 to 5 cans/day.  Then we can plan to start her on FOLFOX  chemotherapy. -I will follow with her tomorrow.  All questions were answered. The patient knows to call the clinic with any problems, questions or concerns. We can certainly see the patient much sooner if necessary.    Derek Jack

## 2020-06-13 ENCOUNTER — Ambulatory Visit (HOSPITAL_COMMUNITY): Payer: Medicare HMO | Admitting: Hematology

## 2020-06-13 ENCOUNTER — Ambulatory Visit (HOSPITAL_COMMUNITY): Payer: Medicare HMO

## 2020-06-13 ENCOUNTER — Other Ambulatory Visit (HOSPITAL_COMMUNITY): Payer: Medicare HMO

## 2020-06-13 ENCOUNTER — Ambulatory Visit: Payer: Medicare HMO | Admitting: "Endocrinology

## 2020-06-13 LAB — GLUCOSE, CAPILLARY
Glucose-Capillary: 136 mg/dL — ABNORMAL HIGH (ref 70–99)
Glucose-Capillary: 130 mg/dL — ABNORMAL HIGH (ref 70–99)
Glucose-Capillary: 133 mg/dL — ABNORMAL HIGH (ref 70–99)
Glucose-Capillary: 147 mg/dL — ABNORMAL HIGH (ref 70–99)
Glucose-Capillary: 128 mg/dL — ABNORMAL HIGH (ref 70–99)

## 2020-06-13 MED ORDER — HYDROMORPHONE HCL 2 MG/ML IJ SOLN: 4.0000 mg | INTRAMUSCULAR | Status: DC | PRN

## 2020-06-13 MED ORDER — SODIUM CHLORIDE 0.9 % IV SOLN
1000.0000 mL | INTRAVENOUS | Status: DC
  Administered 2020-06-14: 1000 mL via INTRAVENOUS

## 2020-06-13 MED ORDER — APIXABAN 5 MG PO TABS
5.0000 mg | ORAL_TABLET | Freq: Two times a day (BID) | ORAL | Status: DC
  Administered 2020-06-13 – 2020-06-14 (×2): 5 mg via ORAL
  Filled 2020-06-13 (×2): qty 1

## 2020-06-13 MED ORDER — FENTANYL 50 MCG/HR TD PT72
1.0000 | MEDICATED_PATCH | TRANSDERMAL | Status: DC
  Administered 2020-06-13: 1 via TRANSDERMAL
  Filled 2020-06-13: qty 1

## 2020-06-13 MED ORDER — FENTANYL 25 MCG/HR TD PT72: 1.0000 | MEDICATED_PATCH | TRANSDERMAL | Status: DC

## 2020-06-13 MED ORDER — HYDROMORPHONE HCL 4 MG/ML IJ SOLN
4.0000 mg | INTRAMUSCULAR | Status: DC | PRN
  Administered 2020-06-13 – 2020-06-14 (×6): 4 mg via INTRAVENOUS
  Filled 2020-06-13 (×6): qty 1

## 2020-06-13 MED ORDER — HYDROMORPHONE HCL 1 MG/ML IJ SOLN: 3.0000 mg | INTRAMUSCULAR | Status: DC | PRN

## 2020-06-13 MED ORDER — SODIUM CHLORIDE 0.9 % IV SOLN: 1000.0000 mL | INTRAVENOUS | Status: DC

## 2020-06-13 NOTE — Consult Note (Signed)
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  She is having her pain very poorly controlled.  Fentanyl patch was dose increased to 50 mcg this afternoon.  She is crying in pain when I saw her.  She had a bowel movement this morning.  She is able to pass gas.  She is not throwing up.  She is able to drink 2 cans of Ensure today.  Objective: Vital signs in last 24 hours: Temp:  [98.1 F (36.7 C)-98.9 F (37.2 C)] 98.1 F (36.7 C) (09/15 1600) Pulse Rate:  [101-113] 113 (09/15 1600) Resp:  [16-18] 18 (09/15 1600) BP: (138-167)/(71-113) 167/113 (09/15 1600) SpO2:  [96 %-100 %] 100 % (09/15 1403)    Intake/Output from previous day: 09/14 0800 - 09/15 0759 In: 400 [I.V.:400] Out: -     Intake/Output this shift: Total I/O In: 610 [P.O.:610] Out: -    PHYSICAL EXAM: BP (!) 167/113 (BP Location: Left Arm)   Pulse (!) 113   Temp 98.1 F (36.7 C) (Oral)   Resp 18   Ht 5\' 11"  (1.803 m)   Wt 233 lb (105.7 kg)   SpO2 100%   BMI 32.50 kg/m  General appearance: alert and cooperative Lungs: clear to auscultation bilaterally Abdomen: Distended, tense, with tenderness in the upper quadrant. Extremities: No edema or cyanosis. Skin: Skin color, texture, turgor normal. No rashes or lesions Neurologic: Grossly normal   Studies/Results: Results for orders placed or performed during the hospital encounter of 06/10/20 (from the past 48 hour(s))  CBG monitoring, ED     Status: Abnormal   Collection Time: 06/11/20  8:34 PM  Result Value Ref Range   Glucose-Capillary 115 (H) 70 - 99 mg/dL    Comment: Glucose reference range applies only to samples taken after fasting for at least 8 hours.  Glucose, capillary     Status: Abnormal   Collection Time: 06/11/20 10:26 PM   Result Value Ref Range   Glucose-Capillary 113 (H) 70 - 99 mg/dL    Comment: Glucose reference range applies only to samples taken after fasting for at least 8 hours.  Glucose, capillary     Status: Abnormal   Collection Time: 06/12/20 12:31 AM  Result Value Ref Range   Glucose-Capillary 137 (H) 70 - 99 mg/dL    Comment: Glucose reference range applies only to samples taken after fasting for at least 8 hours.  Glucose, capillary     Status: Abnormal   Collection Time: 06/12/20  3:58 AM  Result Value Ref Range   Glucose-Capillary 145 (H) 70 - 99 mg/dL    Comment: Glucose reference range applies only to samples taken after fasting for at least 8 hours.  Comprehensive metabolic panel     Status: Abnormal   Collection Time: 06/12/20  6:13 AM  Result Value Ref Range   Sodium 135 135 - 145 mmol/L   Potassium 4.1 3.5 - 5.1 mmol/L   Chloride 99 98 - 111 mmol/L   CO2 26 22 - 32 mmol/L   Glucose, Bld 118 (H) 70 - 99 mg/dL    Comment: Glucose reference range applies only to samples taken after fasting for at least 8 hours.   BUN 7 6 - 20 mg/dL   Creatinine, Ser 0.64 0.44 - 1.00 mg/dL  Calcium 8.8 (L) 8.9 - 10.3 mg/dL   Total Protein 5.9 (L) 6.5 - 8.1 g/dL   Albumin 3.0 (L) 3.5 - 5.0 g/dL   AST 14 (L) 15 - 41 U/L   ALT 11 0 - 44 U/L   Alkaline Phosphatase 57 38 - 126 U/L   Total Bilirubin 0.7 0.3 - 1.2 mg/dL   GFR calc non Af Amer >60 >60 mL/min   GFR calc Af Amer >60 >60 mL/min   Anion gap 10 5 - 15    Comment: Performed at [facility], [address], [place], [place] [address]  CBC with Differential/Platelet     Status: Abnormal   Collection Time: 06/12/20  6:13 AM  Result Value Ref Range   WBC 4.8 4.0 - 10.5 K/uL   RBC 5.36 (H) 3.87 - 5.11 MIL/uL   Hemoglobin 12.7 12.0 - 15.0 g/dL   HCT 41.2 36 - 46 %   MCV 76.9 (L) 80.0 - 100.0 fL   MCH 23.7 (L) 26.0 - 34.0 pg   MCHC 30.8 30.0 - 36.0 g/dL   RDW 17.4 (H) 11.5 - 15.5 %   Platelets 301 150 - 400 K/uL   nRBC 0.0 0.0 - 0.2 %    Neutrophils Relative % 57 %   Neutro Abs 2.7 1.7 - 7.7 K/uL   Lymphocytes Relative 24 %   Lymphs Abs 1.2 0.7 - 4.0 K/uL   Monocytes Relative 13 %   Monocytes Absolute 0.6 0 - 1 K/uL   Eosinophils Relative 5 %   Eosinophils Absolute 0.3 0 - 0 K/uL   Basophils Relative 1 %   Basophils Absolute 0.0 0 - 0 K/uL   Immature Granulocytes 0 %   Abs Immature Granulocytes 0.00 0.00 - 0.07 K/uL    Comment: Performed at [facility], [address], [place], [place] [address]  Phosphorus     Status: None   Collection Time: 06/12/20  6:13 AM  Result Value Ref Range   Phosphorus 3.6 2.5 - 4.6 mg/dL    Comment: Performed at [facility], [address], [place], [place] [address]  Magnesium     Status: None   Collection Time: 06/12/20  6:13 AM  Result Value Ref Range   Magnesium 2.0 1.7 - 2.4 mg/dL    Comment: Performed at [facility], [address], [place], [place] [address]  Glucose, capillary     Status: Abnormal   Collection Time: 06/12/20  8:00 AM  Result Value Ref Range   Glucose-Capillary 125 (H) 70 - 99 mg/dL    Comment: Glucose reference range applies only to samples taken after fasting for at least 8 hours.  Glucose, capillary     Status: Abnormal   Collection Time: 06/12/20 11:21 AM  Result Value Ref Range   Glucose-Capillary 124 (H) 70 - 99 mg/dL    Comment: Glucose reference range applies only to samples taken after fasting for at least 8 hours.  Glucose, capillary     Status: Abnormal   Collection Time: 06/12/20  5:09 PM  Result Value Ref Range   Glucose-Capillary 139 (H) 70 - 99 mg/dL    Comment: Glucose reference range applies only to samples taken after fasting for at least 8 hours.  Glucose, capillary     Status: Abnormal   Collection Time: 06/12/20  8:30 PM  Result Value Ref Range   Glucose-Capillary 100 (H) 70 - 99 mg/dL    Comment: Glucose reference range applies only to samples taken after fasting  for at least 8 hours.  Glucose, capillary     Status: Abnormal    Collection Time: 06/13/20  3:23 AM  Result Value Ref Range   Glucose-Capillary 136 (H) 70 - 99 mg/dL    Comment: Glucose reference range applies only to samples taken after fasting for at least 8 hours.  Glucose, capillary     Status: Abnormal   Collection Time: 06/13/20  7:39 AM  Result Value Ref Range   Glucose-Capillary 130 (H) 70 - 99 mg/dL    Comment: Glucose reference range applies only to samples taken after fasting for at least 8 hours.  Glucose, capillary     Status: Abnormal   Collection Time: 06/13/20 11:22 AM  Result Value Ref Range   Glucose-Capillary 133 (H) 70 - 99 mg/dL    Comment: Glucose reference range applies only to samples taken after fasting for at least 8 hours.  Glucose, capillary     Status: Abnormal   Collection Time: 06/13/20  4:30 PM  Result Value Ref Range   Glucose-Capillary 147 (H) 70 - 99 mg/dL    Comment: Glucose reference range applies only to samples taken after fasting for at least 8 hours.   Comment 1 Notify RN    Comment 2 Document in Chart    No results found.   MEDICATIONS: I have reviewed the patient's current medications.     Assessment/Plan:  1.  Severe abdominal pain: -Fentanyl patch increased to 50 mcg this afternoon. -She is on Dilaudid 2 mg every 3 hours as needed.  This is not controlling. -I will increase Dilaudid to 4 mg every 3 hours as needed. -If this regimen controls her pain, Dilaudid will be switched to oral prior to discharge.  2.  Nutrition: -She was encouraged to drink Ensure Plus 4 to 5 cans/day.  3.  Metastatic pancreatic cancer: -CTAP from 06/10/2020 showed increasing ascites compared to prior scan from 05/28/2020. -Paracentesis on 06/11/2020 yielded 20 cc of serosanguineous fluid. -As her ascites is loculated, I have recommended reattempting paracentesis at a different location. -She does not want to give up and is not ready for hospice yet.  She wants to try chemotherapy. -If she is able to be discharged over  this weekend, I plan to start her on chemotherapy Monday.  4.  Pulmonary embolism: -She had incidental pulmonary embolism on 05/28/2020. -We will add Eliquis 5 mg twice daily.  All questions were answered. The patient knows to call the clinic with any problems, questions or concerns. We can certainly see the patient much sooner if necessary.     Derek Jack

## 2020-06-13 NOTE — Progress Notes (Signed)
PROGRESS NOTE  Doris [name]  DOB: 29-Jul-1961  PCP: Merdis Delay, DO CWC:[id number]  DOA: 06/10/2020  LOS: 3 days   Chief Complaint  Patient presents with  . Abdominal Pain    Brief narrative: Doris [name] is a 59 y.o. 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 Patient presented to the ED on 9/12 with progressively worsening abdominal pain for 3 weeks.  He has pancreatic cancer and follows up with oncologist Dr. Delton Coombes as an outpatient.  She was receiving chemotherapy every 2 weeks.  About a week ago, she was found to have metastases and hence she could not receive the previous chemo.  She states he is currently being worked up for a referral to [facility].   In the interval, patient started having worsening abdominal pain, distention, poor appetite and generalized weakness and hence presented to the ED.    In the ED, patient was intermittently tachycardic, hemodynamically stable.   IV Dilaudid given for pain.   Labs showed normal CBC and BMP except for hyperglycemia and hypoalbuminemia.   CT abdomen and pelvis with contrast showed an increasing peritoneal nodularity/omental caking with increasing moderate volume possibly loculated malignant ascites throughout the abdomen.  Hypoattenuating mass involving the pancreatic tail/body with encasement of the proximal celiac axis, splenic artery and vein as well as partial of the body of the left adrenal gland was noted.  Multiple rim enhancing, centrally hypoattenuating liver lesions compatible with hepatic metastatic disease was also noted.  Pulmonary nodules in the lung bases compatible with suspected metastatic disease was noted.   Patient was admitted to hospital service for further evaluation and management.  Oncology consultation was called.  Subjective: Patient was seen and examined this morning. Continues to have abdominal pain, anorexia. Noted pain  medicines at this point by oncology yesterday.  Assessment/Plan: Intractable abdominal pain Malignant ascites -Presented with worsening abdominal pain in the setting of metastatic pancreatic cancer -Ultrasound paracentesis obtained on 9/13 but only was able to remove 20 mL of fluid because of loculated nature ascites. -Current pain control regimen includes fentanyl patch and IV Dilaudid every 3 hours.   -Oncology following.    Metastatic pancreatic cancer -CT abdomen pelvis as above showing increasing peritoneal nodularity, metastasis to liver as well as lungs.   -Full CODE STATUS.  Patient wants to improve and try new chemotherapy.  Oncology consultation appreciated.  Poorly controlled diabetes mellitus type 2 Hyperglycemia -On NovoLog 20 minutes 3 times daily at home. -Currently on Lantus 10 units daily and sliding scale insulin. Since her oral intake is really poor, she is at risk of hypoglycemia and hence getting low-dose of insulin only. -Encouraged nutritional supplement Lab Results  Component Value Date   HGBA1C 8.8 (H) 06/10/2020   Recent Labs  Lab 06/12/20 1709 06/12/20 2030 06/13/20 0323 06/13/20 0739 06/13/20 1122  GLUCAP 139* 100* 136* 130* 133*    History of pulmonary embolism -On Eliquis.  It was held because of the need of paracentesis.  Will resume today.  Mobility: Independent at home.  Currently weak.  Ambulation expected to improve after paracentesis  Code Status:   Code Status: Full Code  Nutritional status: Body mass index is 32.5 kg/m.     Diet Order            Diet Carb Modified Fluid consistency: Thin; Room service appropriate? Yes  Diet effective now  DVT prophylaxis: SCDs Start: 06/11/20 0031   Antimicrobials:  None Fluid: Reduce normal saline to 50 mill per hour. Consultants: Oncology Family Communication:  None at bedside  Status is: Inpatient  Remains inpatient appropriate because of inadequate pain  control.  Dispo: The patient is from: Home              Anticipated d/c is to: Home              Anticipated d/c date is: 2 days              Patient currently is not medically stable to d/c.  Infusions:  . sodium chloride      Scheduled Meds: . Chlorhexidine Gluconate Cloth  6 each Topical Daily  . feeding supplement (ENSURE ENLIVE)  237 mL Oral BID BM  . fentaNYL  1 patch Transdermal Q72H  . insulin aspart  0-15 Units Subcutaneous Q4H  . insulin glargine  10 Units Subcutaneous Daily  . lisinopril  40 mg Oral Daily  . pantoprazole (PROTONIX) IV  40 mg Intravenous Daily    Antimicrobials: Anti-infectives (From admission, onward)   None      PRN meds: diphenhydrAMINE, HYDROmorphone (DILAUDID) injection, lactulose, sodium phosphate   Objective: Vitals:   06/13/20 0513 06/13/20 0800  BP: (!) 162/94 (!) 166/71  Pulse: (!) 109 (!) 113  Resp: 18 16  Temp: 98.4 F (36.9 C) 98.9 F (37.2 C)  SpO2: 96% 100%    Intake/Output Summary (Last 24 hours) at 06/13/2020 1205 Last data filed at 06/12/2020 1400 Gross per 24 hour  Intake 200 ml  Output --  Net 200 ml   Filed Weights   06/10/20 1426  Weight: 105.7 kg   Weight change:  Body mass index is 32.5 kg/m.   Physical Exam: General exam:  Partially controlled pain Skin: No rashes, lesions or ulcers. HEENT: Atraumatic, normocephalic, supple neck, no obvious bleeding Lungs: Clear to auscultation bilaterally CVS: Regular rate and rhythm, no murmur GI/Abd continues to have distended, tender abdomen. CNS: Alert, awake, oriented x3 Psychiatry: Depressed look Extremities: Trace bilateral pedal edema  Data Review: I have personally reviewed the laboratory data and studies available.  Recent Labs  Lab 06/10/20 1455 06/11/20 0104 06/12/20 0613  WBC 6.1 5.6 4.8  NEUTROABS  --   --  2.7  HGB 13.9 12.6 12.7  HCT 44.2 40.2 41.2  MCV 76.5* 76.9* 76.9*  PLT 346 314 301   Recent Labs  Lab 06/10/20 1455 06/11/20 0104  06/12/20 0613  NA 135 134* 135  K 4.7 4.0 4.1  CL 95* 97* 99  CO2 28 28 26   GLUCOSE 229* 162* 118*  BUN 10 8 7   CREATININE 0.69 0.66 0.64  CALCIUM 9.5 8.7* 8.8*  MG  --   --  2.0  PHOS  --   --  3.6    F/u labs ordered  Signed, Terrilee Croak, MD [facility] 06/13/2020

## 2020-06-14 ENCOUNTER — Inpatient Hospital Stay (HOSPITAL_COMMUNITY): Payer: Medicare HMO

## 2020-06-14 LAB — CBC WITH DIFFERENTIAL/PLATELET
Abs Immature Granulocytes: 0.01 10*3/uL (ref 0.00–0.07)
Basophils Absolute: 0 10*3/uL (ref 0.0–0.1)
Basophils Relative: 0 %
Eosinophils Absolute: 0.2 10*3/uL (ref 0.0–0.5)
Eosinophils Relative: 3 %
HCT: 39.4 % (ref 36.0–46.0)
Hemoglobin: 12.4 g/dL (ref 12.0–15.0)
Immature Granulocytes: 0 %
Lymphocytes Relative: 19 %
Lymphs Abs: 0.9 10*3/uL (ref 0.7–4.0)
MCH: 23.8 pg — ABNORMAL LOW (ref 26.0–34.0)
MCHC: 31.5 g/dL (ref 30.0–36.0)
MCV: 75.8 fL — ABNORMAL LOW (ref 80.0–100.0)
Monocytes Absolute: 0.8 10*3/uL (ref 0.1–1.0)
Monocytes Relative: 16 %
Neutro Abs: 3.1 10*3/uL (ref 1.7–7.7)
Neutrophils Relative %: 62 %
Platelets: 310 10*3/uL (ref 150–400)
RBC: 5.2 MIL/uL — ABNORMAL HIGH (ref 3.87–5.11)
RDW: 17.4 % — ABNORMAL HIGH (ref 11.5–15.5)
WBC: 5 10*3/uL (ref 4.0–10.5)
nRBC: 0 % (ref 0.0–0.2)

## 2020-06-14 LAB — BASIC METABOLIC PANEL
Anion gap: 11 (ref 5–15)
BUN: 10 mg/dL (ref 6–20)
CO2: 22 mmol/L (ref 22–32)
Calcium: 8.7 mg/dL — ABNORMAL LOW (ref 8.9–10.3)
Chloride: 99 mmol/L (ref 98–111)
Creatinine, Ser: 0.53 mg/dL (ref 0.44–1.00)
GFR calc Af Amer: >60 mL/min (ref >60)
GFR calc non Af Amer: >60 mL/min (ref >60)
Glucose, Bld: 117 mg/dL — ABNORMAL HIGH (ref 70–99)
Potassium: 4 mmol/L (ref 3.5–5.1)
Sodium: 132 mmol/L — ABNORMAL LOW (ref 135–145)

## 2020-06-14 LAB — PHOSPHORUS: Phosphorus: 3.3 mg/dL (ref 2.5–4.6)

## 2020-06-14 LAB — MAGNESIUM: Magnesium: 2 mg/dL (ref 1.7–2.4)

## 2020-06-14 LAB — GLUCOSE, CAPILLARY
Glucose-Capillary: 117 mg/dL — ABNORMAL HIGH (ref 70–99)
Glucose-Capillary: 120 mg/dL — ABNORMAL HIGH (ref 70–99)
Glucose-Capillary: 128 mg/dL — ABNORMAL HIGH (ref 70–99)
Glucose-Capillary: 151 mg/dL — ABNORMAL HIGH (ref 70–99)

## 2020-06-14 MED ORDER — HEPARIN SOD (PORK) LOCK FLUSH 100 UNIT/ML IV SOLN
500.0000 [IU] | Freq: Once | INTRAVENOUS | Status: AC
  Administered 2020-06-14: 500 [IU] via INTRAVENOUS
  Filled 2020-06-14: qty 5

## 2020-06-14 MED ORDER — HYDROMORPHONE HCL 2 MG PO TABS: 2.0000 mg | ORAL_TABLET | ORAL | 0 refills | Status: AC | PRN

## 2020-06-14 NOTE — Progress Notes (Signed)
.   Pharmacist Chemotherapy Monitoring - Initial Assessment    Anticipated start date: 06/18/20   Regimen:  . Are orders appropriate based on the patient's diagnosis, regimen, and cycle? Yes . Does the plan date match the patient's scheduled date? Yes . Is the sequencing of drugs appropriate? Yes . Are the premedications appropriate for the patient's regimen? Yes . Prior Authorization for treatment is: Approved o If applicable, is the correct biosimilar selected based on the patient's insurance? not applicable  Organ Function and Labs: Marland Kitchen Are dose adjustments needed based on the patient's renal function, hepatic function, or hematologic function? No . Are appropriate labs ordered prior to the start of patient's treatment? Yes . Other organ system assessment, if indicated: N/A . The following baseline labs, if indicated, have been ordered: N/A  Dose Assessment: . Are the drug doses appropriate? Yes . Are the following correct: o Drug concentrations Yes o IV fluid compatible with drug Yes o Administration routes Yes o Timing of therapy Yes . If applicable, does the patient have documented access for treatment and/or plans for port-a-cath placement? yes . If applicable, have lifetime cumulative doses been properly documented and assessed? not applicable Lifetime Dose Tracking  No doses have been documented on this patient for the following tracked chemicals: Doxorubicin, Epirubicin, Idarubicin, Daunorubicin, Mitoxantrone, Bleomycin, Oxaliplatin, Carboplatin, Liposomal Doxorubicin  o   Toxicity Monitoring/Prevention: . The patient has the following take home antiemetics prescribed: N/A . The patient has the following take home medications prescribed: N/A . Medication allergies and previous infusion related reactions, if applicable, have been reviewed and addressed. Yes . The patient's current medication list has been assessed for drug-drug interactions with their chemotherapy regimen. no  significant drug-drug interactions were identified on review.  Order Review: . Are the treatment plan orders signed? No . Is the patient scheduled to see a provider prior to their treatment? Yes  . Needs antiemetics for home  I verify that I have reviewed each item in the above checklist and answered each question accordingly.  Wynona Neat 06/14/2020 1:10 PM

## 2020-06-14 NOTE — Procedures (Addendum)
   US guided LLQ paracentesis  3 L dark yellow fluid No labs per MD  Tolerated well  EBL: none

## 2020-06-14 NOTE — Discharge Summary (Signed)
Physician Discharge Summary  Doris [name] KKX:[id number] DOB: [birth year] DOA: 06/10/2020  PCP: Iantha Fallen A, DO  Admit date: 06/10/2020 Discharge date: 06/14/2020  Admitted From: Home Discharge disposition: Home   Code Status: Full Code  Diet Recommendation: Low-salt/diabetic diet  Discharge Diagnosis:   Principal Problem:   Abdominal pain Active Problems:   Malignant ascites   Pancreatic cancer metastasized to liver ([place])   Pulmonary embolism ([place])   Hypoalbuminemia   Hyperglycemia due to diabetes mellitus ([place])  History of Present Illness / Brief narrative:  Doris Milleris a 59 y.o.femalewith medical history significant fortype 2 diabetes mellitus and 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 Patient presented to the ED on 9/12 with progressively worsening abdominal pain for 3 weeks.  He has pancreatic cancer and follows up with oncologist Dr. Delton Coombes as an outpatient.  She was receiving chemotherapy every 2 weeks.  About a week ago, she was found to have metastases and hence she could not receive the previous chemo.  She states he is currently being worked up for a referral to [facility].   In the interval, patient started having worsening abdominal pain, distention, poor appetite and generalized weakness and hence presented to the ED.    In the ED, patient was intermittently tachycardic, hemodynamically stable.   IV Dilaudid given for pain.   Labs showed normal CBC and BMP except for hyperglycemia and hypoalbuminemia.   CT abdomen and pelvis with contrast showed an increasing peritoneal nodularity/omental caking with increasing moderate volume possibly loculated malignant ascites throughout the abdomen. Hypoattenuating mass involving the pancreatic tail/body with encasement of the proximal celiac axis, splenic artery and vein as well as partial of the body of the left adrenal gland was noted. Multiple rim enhancing, centrally hypoattenuating liver lesions  compatible with hepatic metastatic disease was also noted. Pulmonary nodules in the lung bases compatible with suspected metastatic disease was noted.  Patient was admitted to hospital service for further evaluation and management.  Oncology consultation was called.  Subjective:  Seen and examined this morning.  She was in significant amount of pain this morning.  She underwent paracentesis later in the morning.  Post paracentesis, patient feels significantly better and wants to go home.  Hospital Course:  Intractable abdominal pain Malignant ascites -Presented with worsening abdominal pain in the setting of metastatic pancreatic cancer -Ultrasound paracentesis obtained on 9/13 but only was able to remove 20 mL of fluid because of loculated nature ascites. -Continue to have pain.  Repeat paracentesis done today 9/16 with removal of 3 L of yellow-colored ascitic fluid.  Patient feels much better after the procedure and wants to go home. -She currently has a fentanyl patch 50 mcg placed on 9/15.  She does have morphine pills at home.  Discussed with oncologist Dr. Delton Coombes.  I will give her prescription for as needed Dilaudid for limited supply.  -She has an appointment for chemotherapy at Dr. Tomie China office on 9/20. -MiraLAX as needed for bowel movement  Metastatic pancreatic cancer -CT abdomen pelvis as above showing increasing peritoneal nodularity, metastasis to liver as well as lungs.   -Full CODE STATUS.  Patient wants to improve and try new chemotherapy.  Oncology consultation appreciated.  Poorly controlled diabetes mellitus type 2 Hyperglycemia -On NovoLog 20 minutes 3 times daily at home. -A1c 8.8 on 9/12. -Resume the same insulin regimen at home.  Hypertension -Continue amlodipine and lisinopril.  History of pulmonary embolism -Continue Eliquis.  Wound care:    Discharge Exam:   Vitals:  06/13/20 2005 06/14/20 0410 06/14/20 1030 06/14/20 1100  BP: (!) 147/95  139/86 (!) 149/92 (!) 152/93  Pulse: (!) 107 (!) 104 (!) 112 (!) 110  Resp: 20 20 20 18   Temp: 98.1 F (36.7 C) 98.5 F (36.9 C)    TempSrc: Oral     SpO2: 100% 99% 99% 99%  Weight:      Height:        Body mass index is 32.5 kg/m.  General exam: Appears calm and comfortable.  Not in physical distress Skin: No rashes, lesions or ulcers. HEENT: Atraumatic, normocephalic, supple neck, no obvious bleeding Lungs: Clear to auscultation bilaterally CVS: Regular rate and rhythm, no murmur GI/Abd soft, nontender, distention improved after ascites, bowel sound present CNS: Alert, awake monitor x3 Psychiatry: Cheerful mood Extremities: No pedal edema, no calf tenderness  Follow ups:   Discharge Instructions    Increase activity slowly   Complete by: As directed       Follow-up Information    [facility]. Go on 06/18/2020.   Specialty: Oncology Why: Be at the [place] [facility] information: [address] [phone] [phone]       Iantha Fallen A, DO Follow up.   Specialty: Family Medicine Contact information: [address] [place] [address] [phone]               Recommendations for Outpatient Follow-Up:   1. Follow-up with oncology as an outpatient  Discharge Instructions:  Follow with Primary MD Iantha Fallen A, DO in 7 days   Get CBC/BMP checked in next visit within 1 week by PCP or SNF MD ( we routinely change or add medications that can affect your baseline labs and fluid status, therefore we recommend that you get the mentioned basic workup next visit with your PCP, your PCP may decide not to get them or add new tests based on their clinical decision)  On your next visit with your PCP, please Get Medicines reviewed and adjusted.  Please request your PCP  to go over all Hospital Tests and Procedure/Radiological results at the follow up, please get all Hospital records sent to your  Prim MD by signing hospital release before you go home.  Activity: As tolerated with Full fall precautions use walker/cane & assistance as needed  For Heart failure patients - Check your Weight same time everyday, if you gain over 2 pounds, or you develop in leg swelling, experience more shortness of breath or chest pain, call your Primary MD immediately. Follow Cardiac Low Salt Diet and 1.5 lit/day fluid restriction.  If you have smoked or chewed Tobacco in the last 2 yrs please stop smoking, stop any regular Alcohol  and or any Recreational drug use.  If you experience worsening of your admission symptoms, develop shortness of breath, life threatening emergency, suicidal or homicidal thoughts you must seek medical attention immediately by calling 911 or calling your MD immediately  if symptoms less severe.  You Must read complete instructions/literature along with all the possible adverse reactions/side effects for all the Medicines you take and that have been prescribed to you. Take any new Medicines after you have completely understood and accpet all the possible adverse reactions/side effects.   Do not drive, operate heavy machinery, perform activities at heights, swimming or participation in water activities or provide baby sitting services if your were admitted for syncope or siezures until you have seen by Primary MD or a Neurologist and  advised to do so again.  Do not drive when taking Pain medications.  Do not take more than prescribed Pain, Sleep and Anxiety Medications  Wear Seat belts while driving.   Please note You were cared for by a hospitalist during your hospital stay. If you have any questions about your discharge medications or the care you received while you were in the hospital after you are discharged, you can call the unit and asked to speak with the hospitalist on call if the hospitalist that took care of you is not available. Once you are discharged, your primary care  physician will handle any further medical issues. Please note that NO REFILLS for any discharge medications will be authorized once you are discharged, as it is imperative that you return to your primary care physician (or establish a relationship with a primary care physician if you do not have one) for your aftercare needs so that they can reassess your need for medications and monitor your lab values.    Allergies as of 06/14/2020      Reactions   Hydrocodone Hives   Tramadol Itching      Medication List    STOP taking these medications   ABRAXANE IV   oxyCODONE-acetaminophen 10-325 MG tablet Commonly known as: PERCOCET   Wixela Inhub 250-50 MCG/DOSE Aepb Generic drug: Fluticasone-Salmeterol     TAKE these medications   amLODipine 5 MG tablet Commonly known as: NORVASC Take 1 tablet (5 mg total) by mouth 2 (two) times daily.   Apixaban Starter Pack (10mg  and 5mg ) Commonly known as: ELIQUIS STARTER PACK Take as directed on package: start with two-5mg  tablets twice daily for 7 days. On day 8, switch to one-5mg  tablet twice daily. What changed:   how much to take  how to take this  when to take this  additional instructions   diphenoxylate-atropine 2.5-0.025 MG tablet Commonly known as: LOMOTIL Take 1 tablet by mouth 4 (four) times daily as needed for diarrhea or loose stools.   dronabinol 2.5 MG capsule Commonly known as: MARINOL Take 1 capsule (2.5 mg total) by mouth 2 (two) times daily before a meal.   GEMZAR IV Inject into the vein See admin instructions. Days 1, 8, 15 q 28 days   HYDROmorphone 2 MG tablet Commonly known as: Dilaudid Take 1 tablet (2 mg total) by mouth every 4 (four) hours as needed for up to 3 days for severe pain.   insulin aspart 100 UNIT/ML injection Commonly known as: novoLOG Inject 20 Units into the skin 3 (three) times daily before meals.   Lactulose 20 GM/30ML Soln Take 30 mLs (20 g total) by mouth at bedtime as needed.     lidocaine-prilocaine cream Commonly known as: EMLA Apply 1 application topically once as needed (for port access).   lisinopril 40 MG tablet Commonly known as: ZESTRIL TAKE 1 TABLET(40 MG) BY MOUTH DAILY What changed: See the new instructions.   morphine 30 MG 12 hr tablet Commonly known as: MS CONTIN Take 1 tablet (30 mg total) by mouth every 12 (twelve) hours.   ondansetron 4 MG tablet Commonly known as: ZOFRAN TAKE 1 TABLET(4 MG) BY MOUTH THREE TIMES DAILY       Time coordinating discharge: 35 minutes  The results of significant diagnostics from this hospitalization (including imaging, microbiology, ancillary and laboratory) are listed below for reference.    Procedures and Diagnostic Studies:   CT ABDOMEN PELVIS W CONTRAST  Result Date: 06/10/2020 CLINICAL DATA:  Abdominal pain,  tenderness and swelling, receiving chemotherapy for pancreatic cancer EXAM: CT ABDOMEN AND PELVIS WITH CONTRAST TECHNIQUE: Multidetector CT imaging of the abdomen and pelvis was performed using the standard protocol following bolus administration of intravenous contrast. CONTRAST:  143mL OMNIPAQUE IOHEXOL 300 MG/ML  SOLN COMPARISON:  CT chest, abdomen and pelvis 05/28/2020. FINDINGS: Lower chest: Redemonstration of the scattered sub 5 mm pulmonary nodules in both lower lobes, right middle lobe and lingula compatible with suspected metastatic disease. Lung bases are otherwise clear. Normal heart size. No pericardial effusion. Hepatobiliary: Redemonstration of multiple hepatic metastases demonstrated by some rim enhancement and central hypoattenuation, largest in the inferior right lobe liver measuring up to 3 cm which is not significantly changed in size from the comparison exam when measured at a similar level. Largest lesion in the left lobe liver measures up to 1.7 cm, also unchanged from prior. Few subcentimeter hypoattenuating foci present in the superior right lobe (2/11, 12) are better visualized than  on comparison. Normal hepatic attenuation. Smooth liver surface contour. No visible calcified gallstones. Mild gallbladder wall thickening, nonspecific given some adjacent ascites. No biliary ductal dilatation. Pancreas: Redemonstration of the heterogeneous, hypoattenuating 3.9 by 5.1 cm mass involving the pancreatic tail/body (2/32). Mass appears to encase the proximal celiac axis, splenic artery and vein as well as portion of the body of the left adrenal gland, overall similar in appearance to comparison study. Spleen: Normal in size. No concerning splenic lesions. Adrenals/Urinary Tract: Involvement of the left adrenal gland by the pancreatic mass, as detailed above. No concerning right adrenal nodules. Asymmetric scarring of the interpolar right kidney, similar to comparison. Few parapelvic cysts are noted. Nonobstructing calculus present in the lower pole right kidney. No concerning renal masses. No urolithiasis or hydronephrosis. Urinary bladder is largely decompressed at the time of exam and therefore poorly evaluated by CT imaging. No gross bladder abnormality is evident. Some mild perivesicular hazy stranding is nonspecific however. Stomach/Bowel: Distal esophagus is unremarkable. There is some mild thickening at the gastric antrum with mucosal hyperemia, nonspecific and possibly related to normal peristalsis though could correlate for features of gastritis. No small bowel thickening or dilatation. Small air-filled retrocecal appendix. No colonic dilatation or wall thickening. Scattered colonic diverticula without focal inflammation to suggest diverticulitis. No clear mechanical obstruction. Vascular/Lymphatic: Atherosclerotic calcifications within the abdominal aorta and branch vessels. No aneurysm or ectasia. No discernible enlarged abdominopelvic lymph nodes. Reproductive: Post hysterectomy. Nodular changes along the superior vaginal cuff are again difficult to visualize though may be present (2/78). No  other concerning adnexal lesions. Other: Increasing peritoneal nodularity/omental caking with increasing moderate volume loculated abdominal ascites throughout the abdomen. No free intraperitoneal air. No bowel containing hernias. Mild body wall edema. Musculoskeletal: Multilevel degenerative changes are present in the imaged portions of the spine. No acute osseous abnormality or suspicious osseous lesion. IMPRESSION: 1. Increasing peritoneal nodularity/omental caking with increasing moderate volume possibly loculated malignant ascites throughout the abdomen. 2. Redemonstration of the heterogeneous, hypoattenuating mass involving the pancreatic tail/body with encasement of the proximal celiac axis, splenic artery and vein as well as portion of the body of the left adrenal gland. 3. Multiple rim enhancing, centrally hypoattenuating liver lesions compatible with hepatic metastatic disease. 4. Redemonstration of the scattered sub 5 mm pulmonary nodules in the lung bases, compatible with suspected metastatic disease. 5. Mild thickening at the gastric antrum with mucosal hyperemia, nonspecific and possibly related to normal peristalsis though could correlate for features of gastritis. 6. Nonobstructing right nephrolithiasis and right renal cortical scarring.  7. Mild perivesicular hazy stranding is nonspecific however recommend correlation with urinalysis to exclude infection. 8. Aortic Atherosclerosis (ICD10-I70.0). Electronically Signed   By: Lovena Le M.D.   On: 06/10/2020 20:54   US Paracentesis  Result Date: 06/11/2020 INDICATION: Ascites EXAM: ULTRASOUND GUIDED left PARACENTESIS MEDICATIONS: None. COMPLICATIONS: None immediate. PROCEDURE: Informed written consent was obtained from the patient after a discussion of the risks (including hemorrhage, infection, and damage to adjacent structures, among others), benefits and alternatives to treatment. A timeout was performed prior to the initiation of the  procedure. Initial ultrasound scanning demonstrates a small amount of ascites within the left lower abdominal quadrant. The left lower abdomen was prepped and draped in the usual sterile fashion. 1% lidocaine was used for local anesthesia. Following this, a Yueh catheter was introduced. An ultrasound image was saved for documentation purposes. The paracentesis was performed but only 20 cc of serosanguineous fluid could be aspirated. The catheter was removed and a dressing was applied. The patient tolerated the procedure well without immediate post procedural complication. FINDINGS: A total of approximately 20 cc of serosanguineous fluid was removed. Samples were sent to the laboratory as requested by the clinical team. IMPRESSION: Ultrasound-guided paracentesis yielding 20 cc of peritoneal fluid. Electronically Signed   By: Van Clines M.D.   On: 06/11/2020 12:08   DG Abdomen Acute W/Chest  Result Date: 06/10/2020 CLINICAL DATA:  59 year old with upper abdominal pain, tenderness and swelling. Current history of pancreatic cancer for which the patient is receiving chemotherapy. Current smoker. EXAM: ACUTE ABDOMEN SERIES (2 VIEW ABDOMEN AND 1 VIEW CHEST) COMPARISON:  CT chest, abdomen and pelvis 05/28/2020 and earlier. FINDINGS: Mild gaseous distension of the rectum in the transverse colon. Moderate colonic stool burden. No evidence of small-bowel distention. No evidence of free air or significant air-fluid levels on the ERECT image. Phleboliths in both sides of the pelvis. No visible opaque urinary tract calculi. Degenerative changes involving the lower lumbar spine. Cardiomediastinal silhouette unremarkable. RIGHT jugular Port-A-Cath tip projects at or near the cavoatrial junction. Mild emphysematous changes throughout both lungs as noted on the prior CT. The lung nodules identified on CT are inconspicuous on the x-ray. No confluent or ground-glass airspace consolidation. No pleural effusions. IMPRESSION:  1. No evidence of bowel obstruction or free intraperitoneal air. 2. Moderate colonic stool burden. 3. Mild COPD/emphysema. No acute cardiopulmonary disease.  S Electronically Signed   By: Evangeline Dakin M.D.   On: 06/10/2020 16:41     Labs:   Basic Metabolic Panel: Recent Labs  Lab 06/10/20 1455 06/10/20 1455 06/11/20 0104 06/11/20 0104 06/12/20 0613 06/14/20 0643  NA 135  --  134*  --  135 132*  K 4.7   < > 4.0   < > 4.1 4.0  CL 95*  --  97*  --  99 99  CO2 28  --  28  --  26 22  GLUCOSE 229*  --  162*  --  118* 117*  BUN 10  --  8  --  7 10  CREATININE 0.69  --  0.66  --  0.64 0.53  CALCIUM 9.5  --  8.7*  --  8.8* 8.7*  MG  --   --   --   --  2.0 2.0  PHOS  --   --   --   --  3.6 3.3   < > = values in this interval not displayed.   GFR Estimated Creatinine Clearance: 101.4 mL/min (by C-G formula based  on SCr of 0.53 mg/dL). Liver Function Tests: Recent Labs  Lab 06/10/20 1455 06/11/20 0104 06/12/20 0613  AST 17 14* 14*  ALT 13 12 11   ALKPHOS 70 60 57  BILITOT 0.5 0.8 0.7  PROT 6.7 6.0* 5.9*  ALBUMIN 3.3* 3.0* 3.0*   Recent Labs  Lab 06/10/20 1455  LIPASE 17   No results for input(s): AMMONIA in the last 168 hours. Coagulation profile Recent Labs  Lab 06/11/20 0104  INR 1.2    CBC: Recent Labs  Lab 06/10/20 1455 06/11/20 0104 06/12/20 0613 06/14/20 0643  WBC 6.1 5.6 4.8 5.0  NEUTROABS  --   --  2.7 3.1  HGB 13.9 12.6 12.7 12.4  HCT 44.2 40.2 41.2 39.4  MCV 76.5* 76.9* 76.9* 75.8*  PLT 346 314 301 310   Cardiac Enzymes: No results for input(s): CKTOTAL, CKMB, CKMBINDEX, TROPONINI in the last 168 hours. BNP: Invalid input(s): POCBNP CBG: Recent Labs  Lab 06/13/20 2004 06/14/20 0013 06/14/20 0400 06/14/20 0717 06/14/20 1118  GLUCAP 128* 151* 128* 120* 117*   D-Dimer No results for input(s): DDIMER in the last 72 hours. Hgb A1c No results for input(s): HGBA1C in the last 72 hours. Lipid Profile No results for input(s): CHOL, HDL,  LDLCALC, TRIG, CHOLHDL, LDLDIRECT in the last 72 hours. Thyroid function studies No results for input(s): TSH, T4TOTAL, T3FREE, THYROIDAB in the last 72 hours.  Invalid input(s): FREET3 Anemia work up No results for input(s): VITAMINB12, FOLATE, FERRITIN, TIBC, IRON, RETICCTPCT in the last 72 hours. Microbiology Recent Results (from the past 240 hour(s))  SARS Coronavirus 2 by RT PCR (hospital order, performed in [facility] hospital lab) Nasopharyngeal Nasopharyngeal Swab     Status: None   Collection Time: 06/10/20 11:16 PM   Specimen: Nasopharyngeal Swab  Result Value Ref Range Status   SARS Coronavirus 2 NEGATIVE NEGATIVE Final    Comment: (NOTE) SARS-CoV-2 target nucleic acids are NOT DETECTED.  The SARS-CoV-2 RNA is generally detectable in upper and lower respiratory specimens during the acute phase of infection. The lowest concentration of SARS-CoV-2 viral copies this assay can detect is 250 copies / mL. A negative result does not preclude SARS-CoV-2 infection and should not be used as the sole basis for treatment or other patient management decisions.  A negative result may occur with improper specimen collection / handling, submission of specimen other than nasopharyngeal swab, presence of viral mutation(s) within the areas targeted by this assay, and inadequate number of viral copies (<250 copies / mL). A negative result must be combined with clinical observations, patient history, and epidemiological information.  Fact Sheet for Patients:   StrictlyIdeas.no  Fact Sheet for Healthcare Providers: BankingDealers.co.za  This test is not yet approved or  cleared by the [place] FDA and has been authorized for detection and/or diagnosis of SARS-CoV-2 by FDA under an Emergency Use Authorization (EUA).  This EUA will remain in effect (meaning this test can be used) for the duration of the COVID-19 declaration under Section  564(b)(1) of the Act, 21 U.S.C. section 360bbb-3(b)(1), unless the authorization is terminated or revoked sooner.  Performed at [facility], [address] [place], [place] [address]      Signed: Terrilee Croak  [facility] 06/14/2020, 1:06 PM

## 2020-06-14 NOTE — Progress Notes (Signed)
Paracentesis complete no signs of distress.  

## 2020-06-14 NOTE — Progress Notes (Signed)
Patient stated "Call that doctor, I'm ready to leave. I feel so much better". On return from paracentesis. Notified Dr. Pietro Cassis. States he will follow-up and discuss plan of care with her.

## 2020-06-15 ENCOUNTER — Encounter (HOSPITAL_COMMUNITY): Payer: Medicare HMO

## 2020-06-18 ENCOUNTER — Inpatient Hospital Stay (HOSPITAL_COMMUNITY): Payer: Medicare HMO

## 2020-06-18 ENCOUNTER — Inpatient Hospital Stay (HOSPITAL_COMMUNITY): Payer: Medicare HMO | Admitting: Hematology

## 2020-06-20 ENCOUNTER — Encounter (HOSPITAL_COMMUNITY): Payer: Medicare HMO

## 2020-06-22 ENCOUNTER — Emergency Department (HOSPITAL_COMMUNITY): Payer: Medicare HMO

## 2020-06-22 ENCOUNTER — Inpatient Hospital Stay (HOSPITAL_COMMUNITY)
Admission: EM | Admit: 2020-06-22 | Discharge: 2020-06-27 | DRG: 374 | Disposition: A | Discharge status: Hospice | Payer: Medicare HMO | Attending: Internal Medicine | Admitting: Internal Medicine

## 2020-06-22 ENCOUNTER — Encounter (HOSPITAL_COMMUNITY): Payer: Self-pay

## 2020-06-22 ENCOUNTER — Other Ambulatory Visit: Payer: Self-pay

## 2020-06-22 DIAGNOSIS — K5900 Constipation, unspecified: Secondary | ICD-10-CM | POA: Diagnosis present

## 2020-06-22 DIAGNOSIS — Z6832 Body mass index (BMI) 32.0-32.9, adult: Secondary | ICD-10-CM

## 2020-06-22 DIAGNOSIS — Z66 Do not resuscitate: Secondary | ICD-10-CM | POA: Diagnosis present

## 2020-06-22 DIAGNOSIS — Z8507 Personal history of malignant neoplasm of pancreas: Secondary | ICD-10-CM

## 2020-06-22 DIAGNOSIS — N200 Calculus of kidney: Secondary | ICD-10-CM | POA: Diagnosis present

## 2020-06-22 DIAGNOSIS — C787 Secondary malignant neoplasm of liver and intrahepatic bile duct: Secondary | ICD-10-CM | POA: Diagnosis present

## 2020-06-22 DIAGNOSIS — E43 Unspecified severe protein-calorie malnutrition: Secondary | ICD-10-CM | POA: Diagnosis present

## 2020-06-22 DIAGNOSIS — Z800 Family history of malignant neoplasm of digestive organs: Secondary | ICD-10-CM

## 2020-06-22 DIAGNOSIS — E871 Hypo-osmolality and hyponatremia: Secondary | ICD-10-CM | POA: Diagnosis present

## 2020-06-22 DIAGNOSIS — R180 Malignant ascites: Secondary | ICD-10-CM | POA: Diagnosis not present

## 2020-06-22 DIAGNOSIS — Z794 Long term (current) use of insulin: Secondary | ICD-10-CM

## 2020-06-22 DIAGNOSIS — Z7901 Long term (current) use of anticoagulants: Secondary | ICD-10-CM

## 2020-06-22 DIAGNOSIS — C259 Malignant neoplasm of pancreas, unspecified: Secondary | ICD-10-CM

## 2020-06-22 DIAGNOSIS — E1165 Type 2 diabetes mellitus with hyperglycemia: Secondary | ICD-10-CM | POA: Diagnosis present

## 2020-06-22 DIAGNOSIS — Z90710 Acquired absence of both cervix and uterus: Secondary | ICD-10-CM

## 2020-06-22 DIAGNOSIS — Z20822 Contact with and (suspected) exposure to covid-19: Secondary | ICD-10-CM | POA: Diagnosis present

## 2020-06-22 DIAGNOSIS — C786 Secondary malignant neoplasm of retroperitoneum and peritoneum: Secondary | ICD-10-CM | POA: Diagnosis not present

## 2020-06-22 DIAGNOSIS — F17210 Nicotine dependence, cigarettes, uncomplicated: Secondary | ICD-10-CM | POA: Diagnosis present

## 2020-06-22 DIAGNOSIS — Z801 Family history of malignant neoplasm of trachea, bronchus and lung: Secondary | ICD-10-CM

## 2020-06-22 DIAGNOSIS — Z7189 Other specified counseling: Secondary | ICD-10-CM

## 2020-06-22 DIAGNOSIS — Z8249 Family history of ischemic heart disease and other diseases of the circulatory system: Secondary | ICD-10-CM

## 2020-06-22 DIAGNOSIS — Z79891 Long term (current) use of opiate analgesic: Secondary | ICD-10-CM

## 2020-06-22 DIAGNOSIS — R627 Adult failure to thrive: Secondary | ICD-10-CM | POA: Diagnosis present

## 2020-06-22 DIAGNOSIS — Z7401 Bed confinement status: Secondary | ICD-10-CM

## 2020-06-22 DIAGNOSIS — Z86711 Personal history of pulmonary embolism: Secondary | ICD-10-CM | POA: Diagnosis not present

## 2020-06-22 DIAGNOSIS — G893 Neoplasm related pain (acute) (chronic): Secondary | ICD-10-CM | POA: Diagnosis present

## 2020-06-22 DIAGNOSIS — Z79899 Other long term (current) drug therapy: Secondary | ICD-10-CM

## 2020-06-22 DIAGNOSIS — Z885 Allergy status to narcotic agent status: Secondary | ICD-10-CM

## 2020-06-22 DIAGNOSIS — Z823 Family history of stroke: Secondary | ICD-10-CM

## 2020-06-22 DIAGNOSIS — Z833 Family history of diabetes mellitus: Secondary | ICD-10-CM

## 2020-06-22 DIAGNOSIS — R52 Pain, unspecified: Secondary | ICD-10-CM

## 2020-06-22 DIAGNOSIS — Z515 Encounter for palliative care: Secondary | ICD-10-CM

## 2020-06-22 DIAGNOSIS — I10 Essential (primary) hypertension: Secondary | ICD-10-CM | POA: Diagnosis present

## 2020-06-22 DIAGNOSIS — R1900 Intra-abdominal and pelvic swelling, mass and lump, unspecified site: Secondary | ICD-10-CM | POA: Diagnosis not present

## 2020-06-22 DIAGNOSIS — E860 Dehydration: Secondary | ICD-10-CM

## 2020-06-22 DIAGNOSIS — Z8042 Family history of malignant neoplasm of prostate: Secondary | ICD-10-CM

## 2020-06-22 LAB — CBC WITH DIFFERENTIAL/PLATELET
Abs Immature Granulocytes: 0.04 10*3/uL (ref 0.00–0.07)
Basophils Absolute: 0 10*3/uL (ref 0.0–0.1)
Basophils Relative: 1 %
Eosinophils Absolute: 0.2 10*3/uL (ref 0.0–0.5)
Eosinophils Relative: 3 %
HCT: 39.5 % (ref 36.0–46.0)
Hemoglobin: 12.8 g/dL (ref 12.0–15.0)
Immature Granulocytes: 1 %
Lymphocytes Relative: 25 %
Lymphs Abs: 1.5 10*3/uL (ref 0.7–4.0)
MCH: 23.9 pg — ABNORMAL LOW (ref 26.0–34.0)
MCHC: 32.4 g/dL (ref 30.0–36.0)
MCV: 73.7 fL — ABNORMAL LOW (ref 80.0–100.0)
Monocytes Absolute: 0.9 10*3/uL (ref 0.1–1.0)
Monocytes Relative: 14 %
Neutro Abs: 3.5 10*3/uL (ref 1.7–7.7)
Neutrophils Relative %: 56 %
Platelets: 376 10*3/uL (ref 150–400)
RBC: 5.36 MIL/uL — ABNORMAL HIGH (ref 3.87–5.11)
RDW: 16.9 % — ABNORMAL HIGH (ref 11.5–15.5)
WBC: 6.2 10*3/uL (ref 4.0–10.5)
nRBC: 0 % (ref 0.0–0.2)

## 2020-06-22 LAB — COMPREHENSIVE METABOLIC PANEL
ALT: 16 U/L (ref 0–44)
AST: 18 U/L (ref 15–41)
Albumin: 2.9 g/dL — ABNORMAL LOW (ref 3.5–5.0)
Alkaline Phosphatase: 91 U/L (ref 38–126)
Anion gap: 11 (ref 5–15)
BUN: 22 mg/dL — ABNORMAL HIGH (ref 6–20)
CO2: 26 mmol/L (ref 22–32)
Calcium: 8.6 mg/dL — ABNORMAL LOW (ref 8.9–10.3)
Chloride: 91 mmol/L — ABNORMAL LOW (ref 98–111)
Creatinine, Ser: 0.97 mg/dL (ref 0.44–1.00)
GFR calc Af Amer: >60 mL/min (ref >60)
GFR calc non Af Amer: >60 mL/min (ref >60)
Glucose, Bld: 174 mg/dL — ABNORMAL HIGH (ref 70–99)
Potassium: 4.4 mmol/L (ref 3.5–5.1)
Sodium: 128 mmol/L — ABNORMAL LOW (ref 135–145)
Total Bilirubin: 0.7 mg/dL (ref 0.3–1.2)
Total Protein: 6.1 g/dL — ABNORMAL LOW (ref 6.5–8.1)

## 2020-06-22 LAB — RESPIRATORY PANEL BY RT PCR (FLU A&B, COVID)
Influenza A by PCR: NEGATIVE
Influenza B by PCR: NEGATIVE
SARS Coronavirus 2 by RT PCR: NEGATIVE

## 2020-06-22 LAB — LIPASE, BLOOD: Lipase: 19 U/L (ref 11–51)

## 2020-06-22 LAB — LACTIC ACID, PLASMA: Lactic Acid, Venous: 1.2 mmol/L (ref 0.5–1.9)

## 2020-06-22 MED ORDER — OXYCODONE HCL 5 MG PO TABS
10.0000 mg | ORAL_TABLET | Freq: Four times a day (QID) | ORAL | Status: DC
  Administered 2020-06-22 – 2020-06-27 (×17): 10 mg via ORAL
  Filled 2020-06-22 (×17): qty 2

## 2020-06-22 MED ORDER — INSULIN ASPART 100 UNIT/ML ~~LOC~~ SOLN: 20.0000 [IU] | Freq: Three times a day (TID) | SUBCUTANEOUS | Status: DC

## 2020-06-22 MED ORDER — OXYCODONE-ACETAMINOPHEN 10-325 MG PO TABS: 2.0000 | ORAL_TABLET | Freq: Four times a day (QID) | ORAL | Status: DC

## 2020-06-22 MED ORDER — ONDANSETRON HCL 4 MG/2ML IJ SOLN: 4.0000 mg | Freq: Four times a day (QID) | INTRAMUSCULAR | Status: DC | PRN

## 2020-06-22 MED ORDER — APIXABAN 5 MG PO TABS
5.0000 mg | ORAL_TABLET | Freq: Two times a day (BID) | ORAL | Status: DC
  Administered 2020-06-22: 5 mg via ORAL
  Filled 2020-06-22 (×2): qty 1

## 2020-06-22 MED ORDER — HYDROMORPHONE HCL 1 MG/ML IJ SOLN
1.0000 mg | INTRAMUSCULAR | Status: DC | PRN
  Administered 2020-06-23 – 2020-06-24 (×5): 1 mg via INTRAVENOUS
  Filled 2020-06-22 (×5): qty 1

## 2020-06-22 MED ORDER — SODIUM CHLORIDE 0.9 % IV SOLN: INTRAVENOUS | Status: AC

## 2020-06-22 MED ORDER — AMLODIPINE BESYLATE 5 MG PO TABS
5.0000 mg | ORAL_TABLET | Freq: Two times a day (BID) | ORAL | Status: DC
  Administered 2020-06-22: 5 mg via ORAL
  Filled 2020-06-22 (×2): qty 1

## 2020-06-22 MED ORDER — DIPHENOXYLATE-ATROPINE 2.5-0.025 MG PO TABS: 1.0000 | ORAL_TABLET | Freq: Four times a day (QID) | ORAL | Status: DC | PRN

## 2020-06-22 MED ORDER — ONDANSETRON HCL 4 MG PO TABS: 4.0000 mg | ORAL_TABLET | Freq: Three times a day (TID) | ORAL | Status: DC | PRN

## 2020-06-22 MED ORDER — MORPHINE SULFATE ER 30 MG PO TBCR
30.0000 mg | EXTENDED_RELEASE_TABLET | Freq: Two times a day (BID) | ORAL | Status: DC
  Administered 2020-06-22 – 2020-06-24 (×3): 30 mg via ORAL
  Filled 2020-06-22 (×3): qty 1

## 2020-06-22 MED ORDER — LISINOPRIL 10 MG PO TABS
40.0000 mg | ORAL_TABLET | Freq: Every day | ORAL | Status: DC
  Filled 2020-06-22: qty 4

## 2020-06-22 MED ORDER — SODIUM CHLORIDE 0.9 % IV SOLN: INTRAVENOUS | Status: DC

## 2020-06-22 MED ORDER — APIXABAN (ELIQUIS) VTE STARTER PACK (10MG AND 5MG): 5.0000 mg | ORAL_TABLET | Freq: Every day | ORAL | Status: DC

## 2020-06-22 MED ORDER — LACTULOSE 10 GM/15ML PO SOLN: 20.0000 g | Freq: Every evening | ORAL | Status: DC | PRN

## 2020-06-22 MED ORDER — HYDROMORPHONE HCL 1 MG/ML IJ SOLN
1.0000 mg | Freq: Once | INTRAMUSCULAR | Status: AC
  Administered 2020-06-22: 1 mg via INTRAVENOUS
  Filled 2020-06-22: qty 1

## 2020-06-22 MED ORDER — DRONABINOL 2.5 MG PO CAPS
2.5000 mg | ORAL_CAPSULE | Freq: Two times a day (BID) | ORAL | Status: DC
  Administered 2020-06-23 – 2020-06-26 (×8): 2.5 mg via ORAL
  Filled 2020-06-22 (×8): qty 1

## 2020-06-22 MED ORDER — ONDANSETRON HCL 4 MG PO TABS: 4.0000 mg | ORAL_TABLET | Freq: Four times a day (QID) | ORAL | Status: DC | PRN

## 2020-06-22 MED ORDER — OXYCODONE-ACETAMINOPHEN 5-325 MG PO TABS
2.0000 | ORAL_TABLET | Freq: Four times a day (QID) | ORAL | Status: DC
  Administered 2020-06-22 – 2020-06-27 (×17): 2 via ORAL
  Filled 2020-06-22 (×17): qty 2

## 2020-06-22 NOTE — ED Triage Notes (Signed)
Pt is here due to abdominal and leg swelling for 3 days. Pt is currently being treated for pancreatic cancer

## 2020-06-22 NOTE — H&P (Addendum)
History and Physical  Doris [name] WSF:[id number] DOB: [birth year] DOA: 06/22/2020  Referring physician: Dr Rogene [place], ED physician PCP: Merdis Delay, DO  Outpatient Specialists:  Delton Coombes, Oncology  Patient Coming From: home  Chief Complaint: abdominal pain  HPI: Doris [name] is a 59 y.o. female with a history of diabetes, pancreatic cancer with metastatic disease and metastatic ascites, hypertension.  Patient seen for increasing abdominal pain over the past couple of days pain is not well controlled with oral pain medicine.  She was hospitalized approximately 12 days ago due to pain metastatic ascites.  She had a paracentesis which improved the pain.  She did have significant amount of loculated ascites which made the paracentesis more difficult.  Pain increases with movement.  No other palliating or provoking factors.  Denies fevers, chills, nausea, vomiting.  Emergency Department Course: Given IV pain medicine without too much improvement. EDP discussed care with oncology, who recommended admission for paracentesis.  Review of Systems:   Pt denies any fevers, chills, nausea, vomiting, diarrhea, constipation, shortness of breath, dyspnea on exertion, orthopnea, cough, wheezing, palpitations, headache, vision changes, lightheadedness, dizziness, melena, rectal bleeding.  Review of systems are otherwise negative  Past Medical History:  Diagnosis Date  . Diabetes mellitus without complication ([place])   . Family history of prostate cancer   . Family history of stomach cancer   . Hypertension   . Pancreatic cancer ([place])   . Port-A-Cath in place 05/16/2019   Past Surgical History:  Procedure Laterality Date  . ABDOMINAL HYSTERECTOMY    . BACK SURGERY    . KIDNEY SURGERY     per pt, had leakage  . tubal ligation Left    Social History:  reports that she has been smoking cigarettes. She has a 45.00 pack-year smoking history. She has never used smokeless tobacco. She reports  previous alcohol use. She reports that she does not use drugs. Patient lives at home  Allergies  Allergen Reactions  . Hydrocodone Hives  . Tramadol Itching    Family History  Problem Relation Age of Onset  . Diabetes Mother   . Hypertension Mother   . Lung cancer Mother 6       d. 37  . Diabetes Father   . Hypertension Father   . Heart disease Brother   . Stomach cancer Paternal Aunt 17  . Stroke Paternal Grandmother   . Prostate cancer Other        PGMs brother  . Stomach cancer Other        PGMs mother      Prior to Admission medications   Medication Sig Start Date End Date Taking? Authorizing Provider  amLODipine (NORVASC) 5 MG tablet Take 1 tablet (5 mg total) by mouth 2 (two) times daily. 06/01/19  Yes Derek [place], MD  APIXABAN Arne Cleveland) VTE STARTER PACK (10MG  AND 5MG ) Take as directed on package: start with two-5mg  tablets twice daily for 7 days. On day 8, switch to one-5mg  tablet twice daily. Patient taking differently: Take 5 mg by mouth daily.  05/28/20  Yes Derek Jack, MD  diphenoxylate-atropine (LOMOTIL) 2.5-0.025 MG tablet Take 1 tablet by mouth 4 (four) times daily as needed for diarrhea or loose stools. 02/15/20  Yes Derek Jack, MD  dronabinol (MARINOL) 2.5 MG capsule Take 1 capsule (2.5 mg total) by mouth 2 (two) times daily before a meal. 06/06/20  Yes Derek Jack, MD  Gemcitabine HCl (GEMZAR IV) Inject into the vein See admin instructions. Days 1, 8, 15 q  28 days  05/17/19  Yes [provider]  insulin aspart (NOVOLOG) 100 UNIT/ML injection Inject 20 Units into the skin 3 (three) times daily before meals. 06/01/19  Yes Derek Jack, MD  Lactulose 20 GM/30ML SOLN Take 30 mLs (20 g total) by mouth at bedtime as needed. 06/06/20  Yes Derek Jack, MD  lidocaine-prilocaine (EMLA) cream Apply 1 application topically once as needed (for port access).  04/07/20  Yes [provider]  lisinopril (ZESTRIL) 40 MG  tablet TAKE 1 TABLET(40 MG) BY MOUTH DAILY Patient taking differently: Take 40 mg by mouth daily.  06/11/20  Yes Derek Jack, MD  ondansetron (ZOFRAN) 4 MG tablet TAKE 1 TABLET(4 MG) BY MOUTH THREE TIMES DAILY 02/29/20  Yes Derek Jack, MD  oxyCODONE-acetaminophen (PERCOCET) 10-325 MG tablet Take 2 tablets by mouth 4 (four) times daily. 06/18/20  Yes [provider]  morphine (MS CONTIN) 30 MG 12 hr tablet Take 1 tablet (30 mg total) by mouth every 12 (twelve) hours. 06/06/20   Derek Jack, MD  prochlorperazine (COMPAZINE) 10 MG tablet Take 1 tablet (10 mg total) by mouth every 6 (six) hours as needed (Nausea or vomiting). Patient not taking: Reported on 10/25/2019 06/01/19 02/09/20  Derek Jack, MD    Physical Exam: BP (!) 113/100   Pulse 100   Temp 98 F (36.7 C) (Oral)   Resp (!) 21   Wt 105 kg   SpO2 97%   BMI 32.29 kg/m   . General: middle age female. Awake and alert and oriented x3. No acute cardiopulmonary distress.  Marland Kitchen HEENT: Normocephalic atraumatic.  Right and left ears normal in appearance.  Pupils equal, round, reactive to light. Extraocular muscles are intact. Sclerae anicteric and noninjected.  Moist mucosal membranes. No mucosal lesions.  . Neck: Neck supple without lymphadenopathy. No carotid bruits. No masses palpated.  . Cardiovascular: Regular rate with normal S1-S2 sounds. No murmurs, rubs, gallops auscultated. No JVD.  Marland Kitchen Respiratory: Good respiratory effort with no wheezes, rales, rhonchi. Lungs clear to auscultation bilaterally.  No accessory muscle use. . Abdomen: Significant amount of ascites with tight abdomen. Active bowel sounds. No masses or hepatosplenomegaly  . Skin: No rashes, lesions, or ulcerations.  Dry, warm to touch. 2+ dorsalis pedis and radial pulses. . Musculoskeletal: No calf or leg pain. All major joints not erythematous nontender.  No upper or lower joint deformation.  Good ROM.  No contractures  . Psychiatric:  Intact judgment and insight. Pleasant and cooperative. . Neurologic: No focal neurological deficits. Strength is 5/5 and symmetric in upper and lower extremities.  Cranial nerves II through XII are grossly intact.           Labs on Admission: I have personally reviewed following labs and imaging studies  CBC: Recent Labs  Lab 06/22/20 1455  WBC 6.2  NEUTROABS 3.5  HGB 12.8  HCT 39.5  MCV 73.7*  PLT 681   Basic Metabolic Panel: Recent Labs  Lab 06/22/20 1455  NA 128*  K 4.4  CL 91*  CO2 26  GLUCOSE 174*  BUN 22*  CREATININE 0.97  CALCIUM 8.6*   GFR: Estimated Creatinine Clearance: 83.3 mL/min (by C-G formula based on SCr of 0.97 mg/dL). Liver Function Tests: Recent Labs  Lab 06/22/20 1455  AST 18  ALT 16  ALKPHOS 91  BILITOT 0.7  PROT 6.1*  ALBUMIN 2.9*   Recent Labs  Lab 06/22/20 1455  LIPASE 19   No results for input(s): AMMONIA in the last 168 hours.  Coagulation Profile: No results for input(s): INR, PROTIME in the last 168 hours. Cardiac Enzymes: No results for input(s): CKTOTAL, CKMB, CKMBINDEX, TROPONINI in the last 168 hours. BNP (last 3 results) No results for input(s): PROBNP in the last 8760 hours. HbA1C: No results for input(s): HGBA1C in the last 72 hours. CBG: No results for input(s): GLUCAP in the last 168 hours. Lipid Profile: No results for input(s): CHOL, HDL, LDLCALC, TRIG, CHOLHDL, LDLDIRECT in the last 72 hours. Thyroid Function Tests: No results for input(s): TSH, T4TOTAL, FREET4, T3FREE, THYROIDAB in the last 72 hours. Anemia Panel: No results for input(s): VITAMINB12, FOLATE, FERRITIN, TIBC, IRON, RETICCTPCT in the last 72 hours. Urine analysis:    Component Value Date/Time   COLORURINE AMBER (A) 06/10/2020 1608   APPEARANCEUR HAZY (A) 06/10/2020 1608   LABSPEC 1.028 06/10/2020 1608   PHURINE 5.0 06/10/2020 1608   GLUCOSEU NEGATIVE 06/10/2020 1608   HGBUR NEGATIVE 06/10/2020 1608   BILIRUBINUR SMALL (A) 06/10/2020 1608    KETONESUR 5 (A) 06/10/2020 1608   PROTEINUR 30 (A) 06/10/2020 1608   NITRITE NEGATIVE 06/10/2020 1608   LEUKOCYTESUR NEGATIVE 06/10/2020 1608   Sepsis Labs: @LABRCNTIP (procalcitonin:4,lacticidven:4) )No results found for this or any previous visit (from the past 240 hour(s)).   Radiological Exams on Admission: DG Chest Port 1 View  Result Date: 06/22/2020 CLINICAL DATA:  Shortness of breath EXAM: PORTABLE CHEST 1 VIEW COMPARISON:  06/10/2020 chest and abdominal radiographs. FINDINGS: Right chest wall Port-A-Cath tip overlies the superior cavoatrial junction. No focal consolidation, pneumothorax or pleural effusion. Cardiomediastinal silhouette is within normal limits. No acute osseous abnormality. IMPRESSION: No acute airspace disease. Electronically Signed   By: Primitivo Gauze M.D.   On: 06/22/2020 14:57    EKG: Independently reviewed. Sinus tachycardia  Assessment/Plan: Principal Problem:   Malignant ascites Active Problems:   Pancreatic cancer metastasized to liver [facility])   History of pulmonary embolus ([place])   Hyperglycemia due to diabetes mellitus ([place])   Hyponatremia    This patient was discussed with the ED physician, including pertinent vitals, physical exam findings, labs, and imaging.  We also discussed care given by the ED provider.  1. Malignant ascites a. Discussed patient with IR. They recommended calling in the morning to see if there is space to transport the patient to [facility] for paracentesis tomorrow. If unable to do it tomorrow, may have to wait until Monday. b. Pain control. 2. pancreatic cancer with metastatic disease a. Pain control 3. Pulmonary embolus a. Therapeutic anticoagulation 4. Hyponatremia a. Likely secondary to ascites b. EDP thought the patient appeared to be slightly dehydrated and started IV fluids.  We will continue for 1 L and hold. c. Check BMP in the morning 5. hyperglycemia  DVT prophylaxis: Therapeutic anticoagulation Consultants:  Interventional radiology Code Status: DNR Family Communication: None Disposition Plan: Patient to go home following paracentesis   Truett Mainland, DO

## 2020-06-22 NOTE — ED Notes (Signed)
Hospitalist at bedside speaking with patient.

## 2020-06-22 NOTE — ED Notes (Signed)
ED TO INPATIENT HANDOFF REPORT  ED Nurse Name and Phone #:   S Name/Age/Gender Doris [name] 59 y.o. female Room/Bed: APA08/APA08  Code Status   Code Status: DNR  Home/SNF/Other Home Patient oriented to: self, place, time and situation Is this baseline? Yes   Triage Complete: Triage complete  Chief Complaint Malignant ascites [R18.0]  Triage Note Pt is here due to abdominal and leg swelling for 3 days. Pt is currently being treated for pancreatic cancer    Allergies Allergies  Allergen Reactions  . Hydrocodone Hives  . Tramadol Itching    Level of Care/Admitting Diagnosis ED Disposition    ED Disposition Condition [facility] Area: [facility] [[id number]]  Level of Care: Med-Surg [16]  Covid Evaluation: Asymptomatic Screening Protocol (No Symptoms)  Diagnosis: Malignant ascites [789.51.ICD-9-CM]  Admitting Physician: Truett Mainland [place]  Attending Physician: Truett Mainland [[id number]]       B Medical/Surgery History Past Medical History:  Diagnosis Date  . Diabetes mellitus without complication ([place])   . Family history of prostate cancer   . Family history of stomach cancer   . Hypertension   . Pancreatic cancer ([place])   . Port-A-Cath in place 05/16/2019   Past Surgical History:  Procedure Laterality Date  . ABDOMINAL HYSTERECTOMY    . BACK SURGERY    . KIDNEY SURGERY     per pt, had leakage  . tubal ligation Left      A IV Location/Drains/Wounds Patient Lines/Drains/Airways Status    Active Line/Drains/Airways    Name Placement date Placement time Site Days   Implanted Port Right Chest --  --  Chest     External Urinary Catheter 06/22/20  1801  --  less than 1          Intake/Output Last 24 hours  Intake/Output Summary (Last 24 hours) at 06/22/2020 2003 Last data filed at 06/22/2020 1948 Gross per 24 hour  Intake 900 ml  Output --  Net 900 ml    Labs/Imaging Results for orders placed or performed during the  hospital encounter of 06/22/20 (from the past 48 hour(s))  CBC with Differential/Platelet     Status: Abnormal   Collection Time: 06/22/20  2:55 PM  Result Value Ref Range   WBC 6.2 4.0 - 10.5 K/uL   RBC 5.36 (H) 3.87 - 5.11 MIL/uL   Hemoglobin 12.8 12.0 - 15.0 g/dL   HCT 39.5 36 - 46 %   MCV 73.7 (L) 80.0 - 100.0 fL   MCH 23.9 (L) 26.0 - 34.0 pg   MCHC 32.4 30.0 - 36.0 g/dL   RDW 16.9 (H) 11.5 - 15.5 %   Platelets 376 150 - 400 K/uL   nRBC 0.0 0.0 - 0.2 %   Neutrophils Relative % 56 %   Neutro Abs 3.5 1.7 - 7.7 K/uL   Lymphocytes Relative 25 %   Lymphs Abs 1.5 0.7 - 4.0 K/uL   Monocytes Relative 14 %   Monocytes Absolute 0.9 0 - 1 K/uL   Eosinophils Relative 3 %   Eosinophils Absolute 0.2 0 - 0 K/uL   Basophils Relative 1 %   Basophils Absolute 0.0 0 - 0 K/uL   Immature Granulocytes 1 %   Abs Immature Granulocytes 0.04 0.00 - 0.07 K/uL    Comment: Performed at [facility], [address] [place], [place] [address]  Comprehensive metabolic panel     Status: Abnormal   Collection Time: 06/22/20  2:55 PM  Result Value Ref Range   Sodium 128 (L) 135 - 145 mmol/L   Potassium 4.4 3.5 - 5.1 mmol/L   Chloride 91 (L) 98 - 111 mmol/L   CO2 26 22 - 32 mmol/L   Glucose, Bld 174 (H) 70 - 99 mg/dL    Comment: Glucose reference range applies only to samples taken after fasting for at least 8 hours.   BUN 22 (H) 6 - 20 mg/dL   Creatinine, Ser 0.97 0.44 - 1.00 mg/dL   Calcium 8.6 (L) 8.9 - 10.3 mg/dL   Total Protein 6.1 (L) 6.5 - 8.1 g/dL   Albumin 2.9 (L) 3.5 - 5.0 g/dL   AST 18 15 - 41 U/L   ALT 16 0 - 44 U/L   Alkaline Phosphatase 91 38 - 126 U/L   Total Bilirubin 0.7 0.3 - 1.2 mg/dL   GFR calc non Af Amer >60 >60 mL/min   GFR calc Af Amer >60 >60 mL/min   Anion gap 11 5 - 15    Comment: Performed at [facility], [address], [place], [place] [address]  Lipase, blood     Status: None   Collection Time: 06/22/20  2:55 PM  Result Value Ref Range   Lipase 19 11 - 51 U/L     Comment: Performed at [facility], [address], [place], [place] [address]  Lactic acid, plasma     Status: None   Collection Time: 06/22/20  2:55 PM  Result Value Ref Range   Lactic Acid, Venous 1.2 0.5 - 1.9 mmol/L    Comment: Performed at [facility], [address], [place], [place] [address]   DG Chest Port 1 View  Result Date: 06/22/2020 CLINICAL DATA:  Shortness of breath EXAM: PORTABLE CHEST 1 VIEW COMPARISON:  06/10/2020 chest and abdominal radiographs. FINDINGS: Right chest wall Port-A-Cath tip overlies the superior cavoatrial junction. No focal consolidation, pneumothorax or pleural effusion. Cardiomediastinal silhouette is within normal limits. No acute osseous abnormality. IMPRESSION: No acute airspace disease. Electronically Signed   By: Primitivo Gauze M.D.   On: 06/22/2020 14:57    Pending Labs Unresulted Labs (From admission, onward)          Start     Ordered   06/23/20 5009  Basic metabolic panel  Tomorrow morning,   R        06/22/20 1941   06/23/20 0500  CBC  Tomorrow morning,   R        06/22/20 1941   06/22/20 1800  Respiratory Panel by RT PCR (Flu A&B, Covid) - Nasopharyngeal Swab  (Tier 2 (TAT 2 hrs))  Once,   STAT       Question Answer Comment  Is this test for diagnosis or screening Screening   Symptomatic for COVID-19 as defined by CDC No   Hospitalized for COVID-19 No   Admitted to ICU for COVID-19 No   Previously tested for COVID-19 Yes   Resident in a congregate (group) care setting No   Employed in healthcare setting No   Pregnant No   Has patient completed COVID vaccination(s) (2 doses of Pfizer/Moderna 1 dose of [name]) Unknown      06/22/20 1759          Vitals/Pain Today's Vitals   06/22/20 1815 06/22/20 1830 06/22/20 1900 06/22/20 1939  BP:  (!) 113/100 (!) 131/107   Pulse: 100     Resp:      Temp:      TempSrc:  SpO2:      Weight:      PainSc:    9     Isolation Precautions No active  isolations  Medications Medications  0.9 %  sodium chloride infusion ( Intravenous New Bag/Given 06/22/20 1948)  morphine (MS CONTIN) 12 hr tablet 30 mg (has no administration in time range)  amLODipine (NORVASC) tablet 5 mg (has no administration in time range)  lisinopril (ZESTRIL) tablet 40 mg (has no administration in time range)  diphenoxylate-atropine (LOMOTIL) 2.5-0.025 MG per tablet 1 tablet (has no administration in time range)  insulin aspart (novoLOG) injection 20 Units (has no administration in time range)  dronabinol (MARINOL) capsule 2.5 mg (has no administration in time range)  lactulose (CHRONULAC) 10 GM/15ML solution 20 g (has no administration in time range)  ondansetron (ZOFRAN) tablet 4 mg (has no administration in time range)  ondansetron (ZOFRAN) tablet 4 mg (has no administration in time range)    Or  ondansetron (ZOFRAN) injection 4 mg (has no administration in time range)  HYDROmorphone (DILAUDID) injection 1 mg (has no administration in time range)  oxyCODONE-acetaminophen (PERCOCET/ROXICET) 5-325 MG per tablet 2 tablet (has no administration in time range)    And  oxyCODONE (Oxy IR/ROXICODONE) immediate release tablet 10 mg (has no administration in time range)  apixaban (ELIQUIS) tablet 5 mg (has no administration in time range)  HYDROmorphone (DILAUDID) injection 1 mg (1 mg Intravenous Given 06/22/20 1747)    Mobility walks Moderate fall risk   Focused Assessments    R Recommendations: See Admitting Provider Note  Report given to:   Additional Notes:

## 2020-06-22 NOTE — ED Provider Notes (Signed)
Patient with some increased pain.  We will go ahead and treat with Dilaudid.  That helped her when she was in the hospital before.  Abdomen is distended.  Patient probably would benefit from paracentesis but since she is got loculated situations and on blood thinners.  Probably needs to be done ultrasound-guided by interventional radiology.  Which not would be available up here this weekend.  So patient may need to be transferred to [facility] to have this done.  Patient also with clinically a little bit of dehydration sodium down some BUN up some.  She been receiving IV fluids here.  Plan was through Dr. Sabra Heck after talking to Dr. Delton Coombes.  That patient would be admitted for pain control fluids and paracentesis.  Gust with hospitalist they will talk to interventional radiology to see if this could be done down to [place] or [place] this weekend.   Fredia Sorrow, MD 06/22/20 1753

## 2020-06-22 NOTE — ED Provider Notes (Signed)
[facility] EMERGENCY DEPARTMENT Provider Note   CSN: [id number] Arrival date & time: 06/22/20  1400     History Chief Complaint  Patient presents with  . abdominal swelling    Doris [name] is a 59 y.o. female.  HPI   Pt is a 59 y/o female - hx of DM, and Pancreatic CA, on chemo last dose 6 weeks ago - presents with c/o swelling / bloating and abdominal pain - she has had very little to eath and drink over the last few days / week, and has had a weakness.  She was recently m oved to this area from [place] and has seen Dr. Delton Coombes, She has been diagnosed with pulmonary embolism, pancreatic cancer that has metastasized to her liver with malignant ascites.  She was admitted to the hospital most recently on September 12 and discharged on the 16th, during which time she had a CT scan that showed increased peritoneal and omental caking with loculated malignant ascites throughout the abdomen.  During the hospitalization she had her fentanyl patch to increase to 50 mcg and Dilaudid was increased to 4 mg every 3 hours..  She is on Eliquis for the pulmonary embolism  She was to be evaluated after discharge for starting possible chemotherapy, it does not appear that this is yet occurred.  The patient has had diffuse abdominal pain which is gradually worsening, bloating and swelling is gradually worsening, nothing seems to make this better, it is worse with movement coughing and any palpation of the abdomen.  It is not associated with diarrhea.  She has been taking medications at home for pain without relief including MS Contin 30 mg 12-hour tablets twice daily  Past Medical History:  Diagnosis Date  . Diabetes mellitus without complication ([place])   . Family history of prostate cancer   . Family history of stomach cancer   . Hypertension   . Pancreatic cancer ([place])   . Port-A-Cath in place 05/16/2019    Patient Active Problem List   Diagnosis Date Noted  . Malignant ascites   .  Hypoalbuminemia 06/11/2020  . Hyperglycemia due to diabetes mellitus ([place]) 06/11/2020  . Abdominal pain 06/10/2020  . Pulmonary embolism ([place]) 05/28/2020  . Genetic testing 09/02/2019  . Family history of stomach cancer   . Family history of prostate cancer   . Port-A-Cath in place 05/16/2019  . Pancreatic cancer metastasized to liver ([place]) 05/09/2019  . Goals of care, counseling/discussion 05/09/2019    Past Surgical History:  Procedure Laterality Date  . ABDOMINAL HYSTERECTOMY    . BACK SURGERY    . KIDNEY SURGERY     per pt, had leakage  . tubal ligation Left      OB History   No obstetric history on file.     Family History  Problem Relation Age of Onset  . Diabetes Mother   . Hypertension Mother   . Lung cancer Mother 52       d. 33  . Diabetes Father   . Hypertension Father   . Heart disease Brother   . Stomach cancer Paternal Aunt 85  . Stroke Paternal Grandmother   . Prostate cancer Other        PGMs brother  . Stomach cancer Other        PGMs mother    Social History   Tobacco Use  . Smoking status: Current Every Day Smoker    Packs/day: 1.00    Years: 45.00    Pack years:  45.00    Types: Cigarettes  . Smokeless tobacco: Never Used  Vaping Use  . Vaping Use: Never used  Substance Use Topics  . Alcohol use: Not Currently  . Drug use: Never    Home Medications Prior to Admission medications   Medication Sig Start Date End Date Taking? Authorizing Provider  amLODipine (NORVASC) 5 MG tablet Take 1 tablet (5 mg total) by mouth 2 (two) times daily. 06/01/19   Derek Jack, MD  APIXABAN Arne [place]) VTE STARTER PACK (10MG  AND 5MG ) Take as directed on package: start with two-5mg  tablets twice daily for 7 days. On day 8, switch to one-5mg  tablet twice daily. Patient taking differently: Take 5 mg by mouth daily.  05/28/20   Derek Jack, MD  diphenoxylate-atropine (LOMOTIL) 2.5-0.025 MG tablet Take 1 tablet by mouth 4 (four) times daily as  needed for diarrhea or loose stools. 02/15/20   Derek Jack, MD  dronabinol (MARINOL) 2.5 MG capsule Take 1 capsule (2.5 mg total) by mouth 2 (two) times daily before a meal. 06/06/20   Derek Jack, MD  Gemcitabine HCl (GEMZAR IV) Inject into the vein See admin instructions. Days 1, 8, 15 q 28 days  05/17/19   [provider]  insulin aspart (NOVOLOG) 100 UNIT/ML injection Inject 20 Units into the skin 3 (three) times daily before meals. 06/01/19   Derek Jack, MD  Lactulose 20 GM/30ML SOLN Take 30 mLs (20 g total) by mouth at bedtime as needed. 06/06/20   Derek Jack, MD  lidocaine-prilocaine (EMLA) cream Apply 1 application topically once as needed (for port access).  04/07/20   [provider]  lisinopril (ZESTRIL) 40 MG tablet TAKE 1 TABLET(40 MG) BY MOUTH DAILY 06/11/20   Derek Jack, MD  morphine (MS CONTIN) 30 MG 12 hr tablet Take 1 tablet (30 mg total) by mouth every 12 (twelve) hours. 06/06/20   Derek Jack, MD  ondansetron (ZOFRAN) 4 MG tablet TAKE 1 TABLET(4 MG) BY MOUTH THREE TIMES DAILY 02/29/20   Derek Jack, MD  prochlorperazine (COMPAZINE) 10 MG tablet Take 1 tablet (10 mg total) by mouth every 6 (six) hours as needed (Nausea or vomiting). Patient not taking: Reported on 10/25/2019 06/01/19 02/09/20  Derek Jack, MD    Allergies    Hydrocodone and Tramadol  Review of Systems   Review of Systems  All other systems reviewed and are negative.   Physical Exam Updated Vital Signs BP 128/86   Pulse (!) 114   Resp (!) 26   Wt 105 kg   SpO2 99%   BMI 32.29 kg/m   Physical Exam Vitals and nursing note reviewed.  Constitutional:      Appearance: She is well-developed. She is ill-appearing.  HENT:     Head: Normocephalic and atraumatic.     Mouth/Throat:     Pharynx: No oropharyngeal exudate.  Eyes:     General: No scleral icterus.       Right eye: No discharge.        Left eye: No discharge.      Conjunctiva/sclera: Conjunctivae normal.     Pupils: Pupils are equal, round, and reactive to light.  Neck:     Thyroid: No thyromegaly.     Vascular: No JVD.  Cardiovascular:     Rate and Rhythm: Regular rhythm. Tachycardia present.     Heart sounds: Normal heart sounds. No murmur heard.  No friction rub. No gallop.   Pulmonary:     Effort: No respiratory distress.  Breath sounds: Normal breath sounds. No wheezing or rales.     Comments: Mild tachypnea but speaks in full sentences Abdominal:     General: Bowel sounds are normal. There is distension.     Palpations: There is no mass.     Tenderness: There is abdominal tenderness.     Comments: Fluid wave, diffuse abdominal tenderness to palpation with mild guarding, dullness to percussion diffusely  Musculoskeletal:        General: No tenderness. Normal range of motion.     Cervical back: Normal range of motion and neck supple.     Right lower leg: Edema present.     Left lower leg: Edema present.  Lymphadenopathy:     Cervical: No cervical adenopathy.  Skin:    General: Skin is warm and dry.     Findings: No erythema or rash.  Neurological:     Mental Status: She is alert.     Coordination: Coordination normal.  Psychiatric:        Behavior: Behavior normal.     ED Results / Procedures / Treatments   Labs (all labs ordered are listed, but only abnormal results are displayed) Labs Reviewed  CBC WITH DIFFERENTIAL/PLATELET  COMPREHENSIVE METABOLIC PANEL  LIPASE, BLOOD  LACTIC ACID, PLASMA    EKG None  Radiology No results found.  Procedures Procedures (including critical care time)  Medications Ordered in ED Medications  0.9 %  sodium chloride infusion (has no administration in time range)    ED Course  I have reviewed the triage vital signs and the nursing notes.  Pertinent labs & imaging results that were available during my care of the patient were reviewed by me and considered in my medical decision  making (see chart for details).    MDM Rules/Calculators/A&P                          This patient is ill-appearing with what appears to be severe end-stage cancer.  At this time I would recommend that the patient need inpatient admission, she likely needs hydration, may also need to have paracentesis though this should best be performed under formal ultrasound guidance given the loculated nature of it.  She is also on Eliquis making ultrasound guidance more valuable.  At this point I will start an IV, she will need laboratory work-up, she will likely need to be readmitted to the hospital, unfortunately this patient's malignant ascites shows the severity of her underlying cancer, I fear that ongoing treatment may be of very little benefit to this patient.  Specially given her poor pain control with the oral medications.  I have performed a bedside ultrasound on this patient, these images were not archived but did show small areas collections of ascites that were scattered in different areas.  There was no area that showed obvious easy access for paracentesis.  I discussed the case with Dr. Delton Coombes of the oncology service, he recommends that if this patient wants to pursue treatment she needs to understand that there is a very low success rate given her advanced disease.  Recommends repeat US for paracentesis for pain relief / control.  Pt is tachcyardic and dry appearing - will admit.  Thankfully the CBC shows no leukocytosis or significant anemia, the metabolic panel shows that the patient has some element of malnutrition given her low albumin and low total protein, low calcium, her BUN is creeping up, her sodium is low at 128.  Final Clinical Impression(s) / ED Diagnoses Final diagnoses:  Malignant neoplasm of pancreas, unspecified location of malignancy ([place])  Dehydration  Hyponatremia  Rx / DC Orders ED Discharge Orders    None       Noemi Chapel, MD 06/23/20 [phone]

## 2020-06-22 NOTE — ED Notes (Signed)
Pt. Also has ascites.

## 2020-06-22 NOTE — ED Notes (Signed)
Attempted report x1. 

## 2020-06-23 ENCOUNTER — Other Ambulatory Visit (HOSPITAL_COMMUNITY): Payer: Self-pay | Admitting: Hematology

## 2020-06-23 ENCOUNTER — Observation Stay (HOSPITAL_COMMUNITY): Payer: Medicare HMO

## 2020-06-23 ENCOUNTER — Ambulatory Visit (HOSPITAL_COMMUNITY)
Admission: RE | Admit: 2020-06-23 | Discharge: 2020-06-23 | Disposition: A | Discharge status: Home / Self Care | Payer: Medicare HMO | Source: Ambulatory Visit | Attending: Family Medicine | Admitting: Family Medicine

## 2020-06-23 DIAGNOSIS — Z8042 Family history of malignant neoplasm of prostate: Secondary | ICD-10-CM | POA: Diagnosis not present

## 2020-06-23 DIAGNOSIS — C787 Secondary malignant neoplasm of liver and intrahepatic bile duct: Secondary | ICD-10-CM

## 2020-06-23 DIAGNOSIS — Z515 Encounter for palliative care: Secondary | ICD-10-CM | POA: Diagnosis not present

## 2020-06-23 DIAGNOSIS — Z800 Family history of malignant neoplasm of digestive organs: Secondary | ICD-10-CM | POA: Diagnosis not present

## 2020-06-23 DIAGNOSIS — Z8507 Personal history of malignant neoplasm of pancreas: Secondary | ICD-10-CM | POA: Diagnosis not present

## 2020-06-23 DIAGNOSIS — E1165 Type 2 diabetes mellitus with hyperglycemia: Secondary | ICD-10-CM | POA: Diagnosis present

## 2020-06-23 DIAGNOSIS — I10 Essential (primary) hypertension: Secondary | ICD-10-CM | POA: Diagnosis present

## 2020-06-23 DIAGNOSIS — R52 Pain, unspecified: Secondary | ICD-10-CM | POA: Diagnosis not present

## 2020-06-23 DIAGNOSIS — C259 Malignant neoplasm of pancreas, unspecified: Secondary | ICD-10-CM | POA: Diagnosis not present

## 2020-06-23 DIAGNOSIS — Z8249 Family history of ischemic heart disease and other diseases of the circulatory system: Secondary | ICD-10-CM | POA: Diagnosis not present

## 2020-06-23 DIAGNOSIS — C786 Secondary malignant neoplasm of retroperitoneum and peritoneum: Secondary | ICD-10-CM | POA: Diagnosis present

## 2020-06-23 DIAGNOSIS — E871 Hypo-osmolality and hyponatremia: Secondary | ICD-10-CM

## 2020-06-23 DIAGNOSIS — Z79899 Other long term (current) drug therapy: Secondary | ICD-10-CM | POA: Diagnosis not present

## 2020-06-23 DIAGNOSIS — Z90710 Acquired absence of both cervix and uterus: Secondary | ICD-10-CM | POA: Diagnosis not present

## 2020-06-23 DIAGNOSIS — E860 Dehydration: Secondary | ICD-10-CM | POA: Diagnosis not present

## 2020-06-23 DIAGNOSIS — Z823 Family history of stroke: Secondary | ICD-10-CM | POA: Diagnosis not present

## 2020-06-23 DIAGNOSIS — Z794 Long term (current) use of insulin: Secondary | ICD-10-CM | POA: Diagnosis not present

## 2020-06-23 DIAGNOSIS — Z833 Family history of diabetes mellitus: Secondary | ICD-10-CM | POA: Diagnosis not present

## 2020-06-23 DIAGNOSIS — R180 Malignant ascites: Secondary | ICD-10-CM | POA: Diagnosis present

## 2020-06-23 DIAGNOSIS — I2694 Multiple subsegmental pulmonary emboli without acute cor pulmonale: Secondary | ICD-10-CM

## 2020-06-23 DIAGNOSIS — F17210 Nicotine dependence, cigarettes, uncomplicated: Secondary | ICD-10-CM | POA: Diagnosis present

## 2020-06-23 DIAGNOSIS — Z6832 Body mass index (BMI) 32.0-32.9, adult: Secondary | ICD-10-CM | POA: Diagnosis not present

## 2020-06-23 DIAGNOSIS — Z66 Do not resuscitate: Secondary | ICD-10-CM | POA: Diagnosis present

## 2020-06-23 DIAGNOSIS — N200 Calculus of kidney: Secondary | ICD-10-CM | POA: Diagnosis present

## 2020-06-23 DIAGNOSIS — Z885 Allergy status to narcotic agent status: Secondary | ICD-10-CM | POA: Diagnosis not present

## 2020-06-23 DIAGNOSIS — Z801 Family history of malignant neoplasm of trachea, bronchus and lung: Secondary | ICD-10-CM | POA: Diagnosis not present

## 2020-06-23 DIAGNOSIS — Z79891 Long term (current) use of opiate analgesic: Secondary | ICD-10-CM | POA: Diagnosis not present

## 2020-06-23 DIAGNOSIS — E43 Unspecified severe protein-calorie malnutrition: Secondary | ICD-10-CM | POA: Diagnosis present

## 2020-06-23 DIAGNOSIS — R1900 Intra-abdominal and pelvic swelling, mass and lump, unspecified site: Secondary | ICD-10-CM | POA: Diagnosis present

## 2020-06-23 DIAGNOSIS — Z7189 Other specified counseling: Secondary | ICD-10-CM | POA: Diagnosis not present

## 2020-06-23 DIAGNOSIS — Z20822 Contact with and (suspected) exposure to covid-19: Secondary | ICD-10-CM | POA: Diagnosis present

## 2020-06-23 LAB — CBC
HCT: 39.2 % (ref 36.0–46.0)
Hemoglobin: 12.6 g/dL (ref 12.0–15.0)
MCH: 23.6 pg — ABNORMAL LOW (ref 26.0–34.0)
MCHC: 32.1 g/dL (ref 30.0–36.0)
MCV: 73.4 fL — ABNORMAL LOW (ref 80.0–100.0)
Platelets: 382 10*3/uL (ref 150–400)
RBC: 5.34 MIL/uL — ABNORMAL HIGH (ref 3.87–5.11)
RDW: 17.2 % — ABNORMAL HIGH (ref 11.5–15.5)
WBC: 6.1 10*3/uL (ref 4.0–10.5)
nRBC: 0 % (ref 0.0–0.2)

## 2020-06-23 LAB — GLUCOSE, CAPILLARY
Glucose-Capillary: 159 mg/dL — ABNORMAL HIGH (ref 70–99)
Glucose-Capillary: 131 mg/dL — ABNORMAL HIGH (ref 70–99)
Glucose-Capillary: 119 mg/dL — ABNORMAL HIGH (ref 70–99)

## 2020-06-23 LAB — BASIC METABOLIC PANEL
Anion gap: 13 (ref 5–15)
BUN: 22 mg/dL — ABNORMAL HIGH (ref 6–20)
CO2: 25 mmol/L (ref 22–32)
Calcium: 8.9 mg/dL (ref 8.9–10.3)
Chloride: 91 mmol/L — ABNORMAL LOW (ref 98–111)
Creatinine, Ser: 1.04 mg/dL — ABNORMAL HIGH (ref 0.44–1.00)
GFR calc Af Amer: >60 mL/min (ref >60)
GFR calc non Af Amer: 59 mL/min — ABNORMAL LOW (ref >60)
Glucose, Bld: 148 mg/dL — ABNORMAL HIGH (ref 70–99)
Potassium: 4.5 mmol/L (ref 3.5–5.1)
Sodium: 129 mmol/L — ABNORMAL LOW (ref 135–145)

## 2020-06-23 LAB — BODY FLUID CELL COUNT WITH DIFFERENTIAL
Lymphs, Fluid: 98 %
Monocyte-Macrophage-Serous Fluid: 1 % — ABNORMAL LOW (ref 50–90)
Neutrophil Count, Fluid: 1 % (ref 0–25)
Total Nucleated Cell Count, Fluid: 920 cu mm (ref 0–1000)

## 2020-06-23 MED ORDER — CHLORHEXIDINE GLUCONATE CLOTH 2 % EX PADS: 6.0000 | MEDICATED_PAD | Freq: Every day | CUTANEOUS | Status: DC

## 2020-06-23 MED ORDER — ALBUMIN HUMAN 25 % IV SOLN
25.0000 g | Freq: Once | INTRAVENOUS | Status: AC
  Administered 2020-06-23: 25 g via INTRAVENOUS
  Filled 2020-06-23: qty 100

## 2020-06-23 MED ORDER — SODIUM CHLORIDE 0.9 % IV SOLN: INTRAVENOUS | Status: DC

## 2020-06-23 MED ORDER — INSULIN ASPART 100 UNIT/ML ~~LOC~~ SOLN
0.0000 [IU] | Freq: Three times a day (TID) | SUBCUTANEOUS | Status: DC
  Administered 2020-06-23: 1 [IU] via SUBCUTANEOUS
  Administered 2020-06-23: 2 [IU] via SUBCUTANEOUS
  Administered 2020-06-24 – 2020-06-25 (×2): 1 [IU] via SUBCUTANEOUS

## 2020-06-23 NOTE — Progress Notes (Signed)
PROGRESS NOTE  Doris [name] KAJ:[id number] DOB: 08-30-1961 DOA: 06/22/2020 PCP: Merdis Delay, DO  Brief History:  59 year old female with a history of pulmonary embolus on apixaban diabetes mellitus type 2, hypertension, and metastatic pancreatic cancer to the liver and lung presenting with 1 day history of increasing abdominal pain and increasing bloating and swelling.  She complains of continued decreased oral intake and generalized weakness.  She denies any fevers, chills, headache, chest pain, shortness breath, coughing, hemoptysis, vomiting, diarrhea, dysuria, hematuria, hematochezia, melena.  She states that she has been taking her home opioids including MS Contin and Dilaudid without relief.  As result, she came to the hospital for further evaluation. Notably, the patient was diagnosed with pancreatic cancer in August 2020.  She follows Dr. Delton Coombes at the [facility].  She is currently on salvage therapy.  The patient was most recently admitted to the hospital from 06/10/2020 to 06/14/2020 with similar complaints.  Her last paracentesis was on 06/14/2020 removing 3 L.  There was difficulty with her previous paracentesis due to the loculated nature of her ascites. In the emergency department, the patient was afebrile hemodynamically stable with oxygen saturation 99% room air. BMP showed a sodium 128, but BMP was otherwise unremarkable.  LFTs were unremarkable.  Lactic acid was 1.2.  CBC was unremarkable.  The patient was started on IV Dilaudid. Interventional radiology was consulted to assist with possible ultrasound-guided paracentesis.  Assessment/Plan: Uncontrolled abdominal pain/malignant ascites -contacted IR ([facility]) this morning in hopes of possibly getting the patient scheduled for paracentesis if there is availability -contacted [facility]--will transport this am--confirmed -Given the loculated nature of the patient's ascites, there has been concern to perform  paracentesis without ultrasound guidance -Continue IV Dilaudid 1 mg every 2 hours as needed severe pain -Continue home dose of MS Contin 30 mg every 12 hours -Continue home dose of Percocet -PMP AWARE reviewed--last Percocet prescription #240 06/18/2020, MS Contin 06/06/20 #60  Metastatic pancreatic cancer to liver and lung -Patient follows with Dr. Delton Coombes in the [place] -Currently on salvage therapy -06/10/2020 CT abdomen/pelvis--increasing abdominal caking and peritoneal nodularity; moderate loculated malignant ascites; heterogeneous and hypoattenuating mass in the pancreatic body and tail with encasement of the proximal celiac axis, splenic artery and vein and left adrenal gland; hepatic and lung mets present  Pulmonary embolus history -Diagnosed 05/28/2020 -Continue apixaban after paracentesis  Uncontrolled diabetes mellitus type 2 with hyperglycemia -06/10/2020 hemoglobin A1c 8.8 -NovoLog sliding scale  Essential hypertension -Holding amlodipine and lisinopril for now and monitor blood pressures -BP has been well controlled  Failure to thrive/severe malnutrition -Continue supplements -Continue Marinol -B12 -Folate -Start IV fluids  Hyponatremia -Secondary to volume depletion and poor solute intake -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 -Start IV normal saline     Status is: Observation  The patient will require care spanning > 2 midnights and should be moved to inpatient because: IV treatments appropriate due to intensity of illness or inability to take PO  Uncontrolled pain requiring IV analgesia  Dispo: The patient is from: Home              Anticipated d/c is to: Home              Anticipated d/c date is: 2 days              Patient currently is not medically stable to d/c.  Family Communication:  no Family at bedside  Consultants:  IR  Code Status: DNR  DVT Prophylaxis:  apixaban   Procedures: As Listed in Progress Note  Above  Antibiotics: None    Total time spent 45 minutes.  Greater than 50% spent face to face counseling and coordinating care.    Subjective: Patient states that abdominal pain is little better controlled but still severe despite IV Dilaudid.  She denies any fevers, chills, headache, chest pain, shortness breath, cough, hemoptysis, vomiting, diarrhea, hematochezia melena, dysuria, hematuria.  Objective: Vitals:   06/22/20 2000 06/22/20 2201 06/23/20 0203 06/23/20 0503  BP: (!) 131/92 128/76 118/86 140/89  Pulse: (!) 102 (!) 106 (!) 101 (!) 105  Resp:  18 18 18   Temp:  98 F (36.7 C) 98.1 F (36.7 C) 98 F (36.7 C)  TempSrc:  Oral  Oral  SpO2: 97% 97% 97% 99%  Weight:        Intake/Output Summary (Last 24 hours) at 06/23/2020 0741 Last data filed at 06/23/2020 0300 Gross per 24 hour  Intake 1620 ml  Output --  Net 1620 ml   Weight change:  Exam:   General:  Pt is alert, follows commands appropriately, not in acute distress  HEENT: No icterus, No thrush, No neck mass, [place]/AT  Cardiovascular: RRR, S1/S2, no rubs, no gallops  Respiratory: CTA bilaterally, no wheezing, no crackles, no rhonchi  Abdomen: Soft/+BS, diffuse tender, mild distended, no guarding  Extremities: No edema, No lymphangitis, No petechiae, No rashes, no synovitis   Data Reviewed: I have personally reviewed following labs and imaging studies Basic Metabolic Panel: Recent Labs  Lab 06/22/20 1455  NA 128*  K 4.4  CL 91*  CO2 26  GLUCOSE 174*  BUN 22*  CREATININE 0.97  CALCIUM 8.6*   Liver Function Tests: Recent Labs  Lab 06/22/20 1455  AST 18  ALT 16  ALKPHOS 91  BILITOT 0.7  PROT 6.1*  ALBUMIN 2.9*   Recent Labs  Lab 06/22/20 1455  LIPASE 19   No results for input(s): AMMONIA in the last 168 hours. Coagulation Profile: No results for input(s): INR, PROTIME in the last 168 hours. CBC: Recent Labs  Lab 06/22/20 1455  WBC 6.2  NEUTROABS 3.5  HGB 12.8  HCT 39.5  MCV  73.7*  PLT 376   Cardiac Enzymes: No results for input(s): CKTOTAL, CKMB, CKMBINDEX, TROPONINI in the last 168 hours. BNP: Invalid input(s): POCBNP CBG: No results for input(s): GLUCAP in the last 168 hours. HbA1C: No results for input(s): HGBA1C in the last 72 hours. Urine analysis:    Component Value Date/Time   COLORURINE AMBER (A) 06/10/2020 1608   APPEARANCEUR HAZY (A) 06/10/2020 1608   LABSPEC 1.028 06/10/2020 1608   PHURINE 5.0 06/10/2020 1608   GLUCOSEU NEGATIVE 06/10/2020 1608   HGBUR NEGATIVE 06/10/2020 1608   BILIRUBINUR SMALL (A) 06/10/2020 1608   KETONESUR 5 (A) 06/10/2020 1608   PROTEINUR 30 (A) 06/10/2020 1608   NITRITE NEGATIVE 06/10/2020 1608   LEUKOCYTESUR NEGATIVE 06/10/2020 1608   Sepsis Labs: @LABRCNTIP (procalcitonin:4,lacticidven:4) ) Recent Results (from the past 240 hour(s))  Respiratory Panel by RT PCR (Flu A&B, Covid) - Nasopharyngeal Swab     Status: None   Collection Time: 06/22/20  6:00 PM   Specimen: Nasopharyngeal Swab  Result Value Ref Range Status   SARS Coronavirus 2 by RT PCR NEGATIVE NEGATIVE Final    Comment: (NOTE) SARS-CoV-2 target nucleic acids are NOT DETECTED.  The SARS-CoV-2 RNA is generally detectable  in upper respiratoy specimens during the acute phase of infection. The lowest concentration of SARS-CoV-2 viral copies this assay can detect is 131 copies/mL. A negative result does not preclude SARS-Cov-2 infection and should not be used as the sole basis for treatment or other patient management decisions. A negative result may occur with  improper specimen collection/handling, submission of specimen other than nasopharyngeal swab, presence of viral mutation(s) within the areas targeted by this assay, and inadequate number of viral copies (<131 copies/mL). A negative result must be combined with clinical observations, patient history, and epidemiological information. The expected result is Negative.  Fact Sheet for  Patients:  PinkCheek.be  Fact Sheet for Healthcare Providers:  GravelBags.it  This test is no t yet approved or cleared by the [place] FDA and  has been authorized for detection and/or diagnosis of SARS-CoV-2 by FDA under an Emergency Use Authorization (EUA). This EUA will remain  in effect (meaning this test can be used) for the duration of the COVID-19 declaration under Section 564(b)(1) of the Act, 21 U.S.C. section 360bbb-3(b)(1), unless the authorization is terminated or revoked sooner.     Influenza A by PCR NEGATIVE NEGATIVE Final   Influenza B by PCR NEGATIVE NEGATIVE Final    Comment: (NOTE) The Xpert Xpress SARS-CoV-2/FLU/RSV assay is intended as an aid in  the diagnosis of influenza from Nasopharyngeal swab specimens and  should not be used as a sole basis for treatment. Nasal washings and  aspirates are unacceptable for Xpert Xpress SARS-CoV-2/FLU/RSV  testing.  Fact Sheet for Patients: PinkCheek.be  Fact Sheet for Healthcare Providers: GravelBags.it  This test is not yet approved or cleared by the [place] FDA and  has been authorized for detection and/or diagnosis of SARS-CoV-2 by  FDA under an Emergency Use Authorization (EUA). This EUA will remain  in effect (meaning this test can be used) for the duration of the  Covid-19 declaration under Section 564(b)(1) of the Act, 21  U.S.C. section 360bbb-3(b)(1), unless the authorization is  terminated or revoked. Performed at [facility], [address] [place], [place] [address]      Scheduled Meds: . apixaban  5 mg Oral BID  . Chlorhexidine Gluconate Cloth  6 each Topical Daily  . dronabinol  2.5 mg Oral BID AC  . insulin aspart  0-9 Units Subcutaneous TID WC  . morphine  30 mg Oral Q12H  . oxyCODONE-acetaminophen  2 tablet Oral Q6H   And  . oxyCODONE  10 mg Oral Q6H   Continuous  Infusions:  Procedures/Studies: CT ABDOMEN PELVIS W CONTRAST  Result Date: 06/10/2020 CLINICAL DATA:  Abdominal pain, tenderness and swelling, receiving chemotherapy for pancreatic cancer EXAM: CT ABDOMEN AND PELVIS WITH CONTRAST TECHNIQUE: Multidetector CT imaging of the abdomen and pelvis was performed using the standard protocol following bolus administration of intravenous contrast. CONTRAST:  130mL OMNIPAQUE IOHEXOL 300 MG/ML  SOLN COMPARISON:  CT chest, abdomen and pelvis 05/28/2020. FINDINGS: Lower chest: Redemonstration of the scattered sub 5 mm pulmonary nodules in both lower lobes, right middle lobe and lingula compatible with suspected metastatic disease. Lung bases are otherwise clear. Normal heart size. No pericardial effusion. Hepatobiliary: Redemonstration of multiple hepatic metastases demonstrated by some rim enhancement and central hypoattenuation, largest in the inferior right lobe liver measuring up to 3 cm which is not significantly changed in size from the comparison exam when measured at a similar level. Largest lesion in the left lobe liver measures up to 1.7 cm, also unchanged from prior. Few  subcentimeter hypoattenuating foci present in the superior right lobe (2/11, 12) are better visualized than on comparison. Normal hepatic attenuation. Smooth liver surface contour. No visible calcified gallstones. Mild gallbladder wall thickening, nonspecific given some adjacent ascites. No biliary ductal dilatation. Pancreas: Redemonstration of the heterogeneous, hypoattenuating 3.9 by 5.1 cm mass involving the pancreatic tail/body (2/32). Mass appears to encase the proximal celiac axis, splenic artery and vein as well as portion of the body of the left adrenal gland, overall similar in appearance to comparison study. Spleen: Normal in size. No concerning splenic lesions. Adrenals/Urinary Tract: Involvement of the left adrenal gland by the pancreatic mass, as detailed above. No concerning right  adrenal nodules. Asymmetric scarring of the interpolar right kidney, similar to comparison. Few parapelvic cysts are noted. Nonobstructing calculus present in the lower pole right kidney. No concerning renal masses. No urolithiasis or hydronephrosis. Urinary bladder is largely decompressed at the time of exam and therefore poorly evaluated by CT imaging. No gross bladder abnormality is evident. Some mild perivesicular hazy stranding is nonspecific however. Stomach/Bowel: Distal esophagus is unremarkable. There is some mild thickening at the gastric antrum with mucosal hyperemia, nonspecific and possibly related to normal peristalsis though could correlate for features of gastritis. No small bowel thickening or dilatation. Small air-filled retrocecal appendix. No colonic dilatation or wall thickening. Scattered colonic diverticula without focal inflammation to suggest diverticulitis. No clear mechanical obstruction. Vascular/Lymphatic: Atherosclerotic calcifications within the abdominal aorta and branch vessels. No aneurysm or ectasia. No discernible enlarged abdominopelvic lymph nodes. Reproductive: Post hysterectomy. Nodular changes along the superior vaginal cuff are again difficult to visualize though may be present (2/78). No other concerning adnexal lesions. Other: Increasing peritoneal nodularity/omental caking with increasing moderate volume loculated abdominal ascites throughout the abdomen. No free intraperitoneal air. No bowel containing hernias. Mild body wall edema. Musculoskeletal: Multilevel degenerative changes are present in the imaged portions of the spine. No acute osseous abnormality or suspicious osseous lesion. IMPRESSION: 1. Increasing peritoneal nodularity/omental caking with increasing moderate volume possibly loculated malignant ascites throughout the abdomen. 2. Redemonstration of the heterogeneous, hypoattenuating mass involving the pancreatic tail/body with encasement of the proximal  celiac axis, splenic artery and vein as well as portion of the body of the left adrenal gland. 3. Multiple rim enhancing, centrally hypoattenuating liver lesions compatible with hepatic metastatic disease. 4. Redemonstration of the scattered sub 5 mm pulmonary nodules in the lung bases, compatible with suspected metastatic disease. 5. Mild thickening at the gastric antrum with mucosal hyperemia, nonspecific and possibly related to normal peristalsis though could correlate for features of gastritis. 6. Nonobstructing right nephrolithiasis and right renal cortical scarring. 7. Mild perivesicular hazy stranding is nonspecific however recommend correlation with urinalysis to exclude infection. 8. Aortic Atherosclerosis (ICD10-I70.0). Electronically Signed   By: Lovena Le M.D.   On: 06/10/2020 20:54   CT CHEST ABDOMEN PELVIS W CONTRAST  Addendum Date: 05/28/2020   ADDENDUM REPORT: 05/28/2020 13:36 ADDENDUM: Critical Value/emergent results were called by telephone at the time of interpretation on 05/28/2020 at 1:30 pm to provider [facility] , who verbally acknowledged these results. Electronically Signed   By: Julian Hy M.D.   On: 05/28/2020 13:36   Result Date: 05/28/2020 CLINICAL DATA:  Metastatic pancreatic cancer EXAM: CT CHEST, ABDOMEN, AND PELVIS WITH CONTRAST TECHNIQUE: Multidetector CT imaging of the chest, abdomen and pelvis was performed following the standard protocol during bolus administration of intravenous contrast. CONTRAST:  150mL OMNIPAQUE IOHEXOL 300 MG/ML  SOLN COMPARISON:  02/07/2020 FINDINGS: CT CHEST FINDINGS  Cardiovascular: The heart is normal in size. No pericardial effusion. No evidence of thoracic aortic aneurysm. Atherosclerotic calcifications of the aortic arch. Although not tailored for evaluation of the pulmonary arteries, incidental segmental/subsegmental right lower lobe pulmonary emboli are suspected (series 3/images 91 and 100). Overall clot burden is small. No  findings to suggest right heart strain (RV to LV ratio 0.87). Right chest port terminates at the cavoatrial junction. Mediastinum/Nodes: No suspicious mediastinal lymphadenopathy. 9 mm short axis left perihilar node (series 3/image 77), grossly unchanged. Visualized thyroid is unremarkable. Lungs/Pleura: Numerous scattered small pulmonary nodules throughout all lobes, measuring up to 6 mm in the left lower lobe (series 10/images 74, 76, and 101), previously 4-5 mm. This appearance remains compatible with metastatic disease. Mild centrilobular emphysematous changes, upper lung predominant. No focal consolidation. No pleural effusion or pneumothorax. Musculoskeletal: Degenerative changes of the thoracic spine. CT ABDOMEN PELVIS FINDINGS Hepatobiliary: Multiple hepatic metastases, progressive, now measuring up to 2.3 cm inferiorly in the right hepatic lobe (series 3/image 168). Previously, image 6 single live mm lesion. Gallbladder is unremarkable. No intrahepatic or extrahepatic ductal dilatation. Pancreas: 3.9 x 5.1 cm mass involving the pancreatic body/proximal tail (series 3/image 169), previously 3.7 x 5.5 cm, grossly unchanged. Mass encases the celiac trunk and directly invades the left adrenal gland. Spleen: Within normal limits. Adrenals/Urinary Tract: Left renal invasion, as above. Right adrenal gland is within normal limits. Left kidney is within normal limits. Stable right renal scarring with 7 mm nonobstructing right lower pole renal calculus (series 3/image 213) and fullness of the right renal collecting system. Bladder is underdistended and poorly evaluated. Stomach/Bowel: Stomach is within normal limits. No evidence of bowel obstruction. No colonic wall thickening or mass is evident on CT. Vascular/Lymphatic: No evidence of abdominal aortic aneurysm. Atherosclerotic calcifications of the abdominal aorta and branch vessels. No suspicious abdominopelvic lymphadenopathy. Reproductive: Status post  hysterectomy. Pelvic nodularity at the vaginal apex is poorly visualized on the current CT. No adnexal masses. Other: Small to moderate abdominopelvic ascites, new. Associated mild peritoneal disease/omental caking. Musculoskeletal: Degenerative changes of the lumbar spine. IMPRESSION: Although not tailored for evaluation of the pulmonary arteries, there are incidental segmental/subsegmental right lower lobe pulmonary emboli. Overall clot burden is small. No findings to suggest right heart strain. 5.1 cm pancreatic mass, corresponding to the patient's known pancreatic adenocarcinoma, grossly unchanged. Associated vascular encasement. Direct invasion of the left adrenal gland. Progressive hepatic metastases. Small to moderate abdominopelvic ascites with mild peritoneal disease/omental caking, new. Multifocal pulmonary metastases measuring up to 6 mm, mildly progressive. 9 mm short axis left perihilar node, grossly unchanged. Electronically Signed: By: Julian Hy M.D. On: 05/28/2020 13:26   US Paracentesis  Result Date: 06/14/2020 INDICATION: Malignant ascites EXAM: ULTRASOUND GUIDED LLQ PARACENTESIS MEDICATIONS: 10 cc 1% lidocaine COMPLICATIONS: None immediate. PROCEDURE: Informed written consent was obtained from the patient after a discussion of the risks, benefits and alternatives to treatment. A timeout was performed prior to the initiation of the procedure. Initial ultrasound scanning demonstrates a large amount of ascites within the left lower abdominal quadrant. The left lower abdomen was prepped and draped in the usual sterile fashion. 1% lidocaine was used for local anesthesia. Following this, a 41 G Yueh catheter was introduced. An ultrasound image was saved for documentation purposes. The paracentesis was performed. The catheter was removed and a dressing was applied. The patient tolerated the procedure well without immediate post procedural complication. Patient received post-procedure  intravenous albumin; see nursing notes for details. FINDINGS: A total of  approximately 3 liters of dark yellow fluid was removed. IMPRESSION: Successful ultrasound-guided paracentesis yielding 3 liters of peritoneal fluid. Read by Lavonia Drafts [facility] Electronically Signed   By: Lavonia Dana M.D.   On: 06/14/2020 11:09   US Paracentesis  Result Date: 06/11/2020 INDICATION: Ascites EXAM: ULTRASOUND GUIDED left PARACENTESIS MEDICATIONS: None. COMPLICATIONS: None immediate. PROCEDURE: Informed written consent was obtained from the patient after a discussion of the risks (including hemorrhage, infection, and damage to adjacent structures, among others), benefits and alternatives to treatment. A timeout was performed prior to the initiation of the procedure. Initial ultrasound scanning demonstrates a small amount of ascites within the left lower abdominal quadrant. The left lower abdomen was prepped and draped in the usual sterile fashion. 1% lidocaine was used for local anesthesia. Following this, a Yueh catheter was introduced. An ultrasound image was saved for documentation purposes. The paracentesis was performed but only 20 cc of serosanguineous fluid could be aspirated. The catheter was removed and a dressing was applied. The patient tolerated the procedure well without immediate post procedural complication. FINDINGS: A total of approximately 20 cc of serosanguineous fluid was removed. Samples were sent to the laboratory as requested by the clinical team. IMPRESSION: Ultrasound-guided paracentesis yielding 20 cc of peritoneal fluid. Electronically Signed   By: Van Clines M.D.   On: 06/11/2020 12:08   DG Chest Port 1 View  Result Date: 06/22/2020 CLINICAL DATA:  Shortness of breath EXAM: PORTABLE CHEST 1 VIEW COMPARISON:  06/10/2020 chest and abdominal radiographs. FINDINGS: Right chest wall Port-A-Cath tip overlies the superior cavoatrial junction. No focal consolidation, pneumothorax or pleural  effusion. Cardiomediastinal silhouette is within normal limits. No acute osseous abnormality. IMPRESSION: No acute airspace disease. Electronically Signed   By: Primitivo Gauze M.D.   On: 06/22/2020 14:57   DG Abdomen Acute W/Chest  Result Date: 06/10/2020 CLINICAL DATA:  59 year old with upper abdominal pain, tenderness and swelling. Current history of pancreatic cancer for which the patient is receiving chemotherapy. Current smoker. EXAM: ACUTE ABDOMEN SERIES (2 VIEW ABDOMEN AND 1 VIEW CHEST) COMPARISON:  CT chest, abdomen and pelvis 05/28/2020 and earlier. FINDINGS: Mild gaseous distension of the rectum in the transverse colon. Moderate colonic stool burden. No evidence of small-bowel distention. No evidence of free air or significant air-fluid levels on the ERECT image. Phleboliths in both sides of the pelvis. No visible opaque urinary tract calculi. Degenerative changes involving the lower lumbar spine. Cardiomediastinal silhouette unremarkable. RIGHT jugular Port-A-Cath tip projects at or near the cavoatrial junction. Mild emphysematous changes throughout both lungs as noted on the prior CT. The lung nodules identified on CT are inconspicuous on the x-ray. No confluent or ground-glass airspace consolidation. No pleural effusions. IMPRESSION: 1. No evidence of bowel obstruction or free intraperitoneal air. 2. Moderate colonic stool burden. 3. Mild COPD/emphysema. No acute cardiopulmonary disease.  S Electronically Signed   By: Evangeline Dakin M.D.   On: 06/10/2020 16:41    Orson Eva, DO  [facility]  If 7PM-7AM, please contact night-coverage [email or web address] Password [facility] 06/23/2020, 7:41 AM   LOS: 0 days

## 2020-06-23 NOTE — Procedures (Signed)
PROCEDURE SUMMARY:  Successful US guided paracentesis from left abdomen Yielded 3.8 L of clear yellow fluid.  No immediate complications.  Pt tolerated well.   Specimen sent for labs.  EBL < 40mL  Theresa Duty, NP 06/23/2020 12:04 PM

## 2020-06-24 LAB — CBC WITH DIFFERENTIAL/PLATELET
Abs Immature Granulocytes: 0.03 10*3/uL (ref 0.00–0.07)
Basophils Absolute: 0 10*3/uL (ref 0.0–0.1)
Basophils Relative: 1 %
Eosinophils Absolute: 0.1 10*3/uL (ref 0.0–0.5)
Eosinophils Relative: 2 %
HCT: 38.7 % (ref 36.0–46.0)
Hemoglobin: 12.1 g/dL (ref 12.0–15.0)
Immature Granulocytes: 1 %
Lymphocytes Relative: 21 %
Lymphs Abs: 1.3 10*3/uL (ref 0.7–4.0)
MCH: 23 pg — ABNORMAL LOW (ref 26.0–34.0)
MCHC: 31.3 g/dL (ref 30.0–36.0)
MCV: 73.4 fL — ABNORMAL LOW (ref 80.0–100.0)
Monocytes Absolute: 0.7 10*3/uL (ref 0.1–1.0)
Monocytes Relative: 11 %
Neutro Abs: 3.9 10*3/uL (ref 1.7–7.7)
Neutrophils Relative %: 64 %
Platelets: 331 10*3/uL (ref 150–400)
RBC: 5.27 MIL/uL — ABNORMAL HIGH (ref 3.87–5.11)
RDW: 16.4 % — ABNORMAL HIGH (ref 11.5–15.5)
WBC: 6.1 10*3/uL (ref 4.0–10.5)
nRBC: 0 % (ref 0.0–0.2)

## 2020-06-24 LAB — BASIC METABOLIC PANEL
Anion gap: 8 (ref 5–15)
BUN: 17 mg/dL (ref 6–20)
CO2: 27 mmol/L (ref 22–32)
Calcium: 8.6 mg/dL — ABNORMAL LOW (ref 8.9–10.3)
Chloride: 97 mmol/L — ABNORMAL LOW (ref 98–111)
Creatinine, Ser: 0.82 mg/dL (ref 0.44–1.00)
GFR calc Af Amer: >60 mL/min (ref >60)
GFR calc non Af Amer: >60 mL/min (ref >60)
Glucose, Bld: 132 mg/dL — ABNORMAL HIGH (ref 70–99)
Potassium: 4.7 mmol/L (ref 3.5–5.1)
Sodium: 132 mmol/L — ABNORMAL LOW (ref 135–145)

## 2020-06-24 LAB — GLUCOSE, CAPILLARY
Glucose-Capillary: 125 mg/dL — ABNORMAL HIGH (ref 70–99)
Glucose-Capillary: 115 mg/dL — ABNORMAL HIGH (ref 70–99)

## 2020-06-24 LAB — FOLATE: Folate: 14.3 ng/mL (ref >5.9)

## 2020-06-24 LAB — MAGNESIUM: Magnesium: 2.1 mg/dL (ref 1.7–2.4)

## 2020-06-24 LAB — VITAMIN B12: Vitamin B-12: 805 pg/mL (ref 180–914)

## 2020-06-24 MED ORDER — HYDROMORPHONE HCL 1 MG/ML IJ SOLN
2.0000 mg | INTRAMUSCULAR | Status: DC | PRN
  Administered 2020-06-24 – 2020-06-27 (×17): 2 mg via INTRAVENOUS
  Filled 2020-06-24 (×17): qty 2

## 2020-06-24 MED ORDER — APIXABAN 5 MG PO TABS
5.0000 mg | ORAL_TABLET | Freq: Two times a day (BID) | ORAL | Status: DC
  Administered 2020-06-24 – 2020-06-26 (×6): 5 mg via ORAL
  Filled 2020-06-24 (×6): qty 1

## 2020-06-24 MED ORDER — MORPHINE SULFATE ER 30 MG PO TBCR
60.0000 mg | EXTENDED_RELEASE_TABLET | Freq: Two times a day (BID) | ORAL | Status: DC
  Administered 2020-06-24 – 2020-06-26 (×5): 60 mg via ORAL
  Filled 2020-06-24 (×5): qty 2

## 2020-06-24 MED ORDER — SODIUM CHLORIDE 0.9 % IV SOLN: INTRAVENOUS | Status: AC

## 2020-06-24 NOTE — Progress Notes (Signed)
Responded to nursing call:  Rash on thighs and elevated BP with elevated HR   Subjective: I came to bedside to eval patient.  She denies f/c, cp, sob vomiting. She complains of abd pain and is asking for pain meds.  Reviewed tele.  HR 110, sinus  Vitals:   06/23/20 1826 06/23/20 2316 06/24/20 0538 06/24/20 1458  BP: 123/86 127/88 131/75 113/76  Pulse: (!) 105 (!) 101 (!) 111 100  Resp: 18 20 16    Temp: 98 F (36.7 C) 98.1 F (36.7 C) 98.2 F (36.8 C) 98.2 F (36.8 C)  TempSrc: Oral   Oral  SpO2:  99% 98%   Weight:       CV--RRR Lung--CTA Abd--soft+BS/diffusely tender Ext--no rashes noted.  Buttock--no rash noted.   Assessment/Plan: Elevated HR and BP due to her cancer pain -increase dilaudid 2 mg IV q 2 hours -informed RN to administer dilaudid     Orson Eva, DO [facility]

## 2020-06-24 NOTE — Progress Notes (Signed)
Patient refusing to have any more fluids hung, patient also refusing to have CBGs done.  Patient stated "I'm done, I just want it over"

## 2020-06-24 NOTE — Progress Notes (Signed)
PROGRESS NOTE  Doris [name] RXV:[id number] DOB: June 14, 1961 DOA: 06/22/2020 PCP: Merdis Delay, DO  Brief History:  59 year old female with a history of pulmonary embolus on apixaban diabetes mellitus type 2, hypertension, and metastatic pancreatic cancer to the liver and lung presenting with 1 day history of increasing abdominal pain and increasing bloating and swelling.  She complains of continued decreased oral intake and generalized weakness.  She denies any fevers, chills, headache, chest pain, shortness breath, coughing, hemoptysis, vomiting, diarrhea, dysuria, hematuria, hematochezia, melena.  She states that she has been taking her home opioids including MS Contin and Dilaudid without relief.  As result, she came to the hospital for further evaluation. Notably, the patient was diagnosed with pancreatic cancer in August 2020.  She follows Dr. Delton Coombes at the [facility].  She is currently on salvage therapy.  The patient was most recently admitted to the hospital from 06/10/2020 to 06/14/2020 with similar complaints.  Her last paracentesis was on 06/14/2020 removing 3 L.  There was difficulty with her previous paracentesis due to the loculated nature of her ascites. In the emergency department, the patient was afebrile hemodynamically stable with oxygen saturation 99% room air. BMP showed a sodium 128, but BMP was otherwise unremarkable.  LFTs were unremarkable.  Lactic acid was 1.2.  CBC was unremarkable.  The patient was started on IV Dilaudid. Interventional radiology was consulted to assist with possible ultrasound-guided paracentesis.  Assessment/Plan: Uncontrolled abdominal pain/malignant ascites -06/23/20 paracentesis--3.8L -Given the loculated nature of the patient's ascites, there has been concern to perform paracentesis without ultrasound guidance -Continue IV Dilaudid 1 mg every 2 hours as needed severe pain -Increase MS Contin 60 mg every 12 hours -Continue  home dose of Percocet -PMP AWARE reviewed--last Percocet prescription #240 06/18/2020, MS Contin 06/06/20 #60  Metastatic pancreatic cancer to liver and lung -Patient follows with Dr. Delton Coombes in the [facility] -Currently on salvage therapy -06/10/2020 CT abdomen/pelvis--increasing abdominal caking and peritoneal nodularity; moderate loculated malignant ascites; heterogeneous and hypoattenuating mass in the pancreatic body and tail with encasement of the proximal celiac axis, splenic artery and vein and left adrenal gland; hepatic and lung mets present  Pulmonary embolus history -Diagnosed 05/28/2020 -Continue apixaban after paracentesis  Uncontrolled diabetes mellitus type 2 with hyperglycemia -06/10/2020 hemoglobin A1c 8.8 -NovoLog sliding scale -CBGs controlled  Essential hypertension -Holding amlodipine and lisinopril for now and monitor blood pressures -BP has been well controlled  Failure to thrive/severe malnutrition -Continue supplements -Continue Marinol -B12--805 -Folate--14.3 -Continue IV fluids  Hyponatremia -Secondary to volume depletion and poor solute intake -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 Delton Coombes first   Status is: inpatient  The patient will require care spanning > 2 midnights and should be moved to inpatient because: IV treatments appropriate due to intensity of illness or inability to take PO  Uncontrolled pain requiring IV analgesia  Dispo: The patient is from: Home  Anticipated d/c is to: Home  Anticipated d/c date is: 2 days  Patient currently is not medically stable to d/c.        Family Communication:  no Family at bedside  Consultants:  IR  Code Status: DNR  DVT Prophylaxis:  apixaban   Procedures: As Listed in Progress  Note Above  Antibiotics: None       Subjective:  Patient complains of pain in abd, 50% better since paracentesis.  Denies f/c, cp, n/v/d, sob Objective: Vitals:   06/23/20 1416 06/23/20 1826 06/23/20 2316 06/24/20 0538  BP: 118/64 123/86 127/88 131/75  Pulse: (!) 102 (!) 105 (!) 101 (!) 111  Resp: 16 18 20 16   Temp: 97.7 F (36.5 C) 98 F (36.7 C) 98.1 F (36.7 C) 98.2 F (36.8 C)  TempSrc: Oral Oral    SpO2: 100%  99% 98%  Weight:        Intake/Output Summary (Last 24 hours) at 06/24/2020 1216 Last data filed at 06/24/2020 0700 Gross per 24 hour  Intake 1589.06 ml  Output --  Net 1589.06 ml   Weight change:  Exam:   General:  Pt is alert, follows commands appropriately, not in acute distress  HEENT: No icterus, No thrush, No neck mass, [place]/AT  Cardiovascular: RRR, S1/S2, no rubs, no gallops  Respiratory: bibasilar crackles.  Abdomen: Soft/+BS, diffuse tender, non distended, no guarding  Extremities: 2+ LE edema, No lymphangitis, No petechiae, No rashes, no synovitis   Data Reviewed: I have personally reviewed following labs and imaging studies Basic Metabolic Panel: Recent Labs  Lab 06/22/20 1455 06/23/20 0706 06/24/20 0757  NA 128* 129* 132*  K 4.4 4.5 4.7  CL 91* 91* 97*  CO2 26 25 27   GLUCOSE 174* 148* 132*  BUN 22* 22* 17  CREATININE 0.97 1.04* 0.82  CALCIUM 8.6* 8.9 8.6*  MG  --   --  2.1   Liver Function Tests: Recent Labs  Lab 06/22/20 1455  AST 18  ALT 16  ALKPHOS 91  BILITOT 0.7  PROT 6.1*  ALBUMIN 2.9*   Recent Labs  Lab 06/22/20 1455  LIPASE 19   No results for input(s): AMMONIA in the last 168 hours. Coagulation Profile: No results for input(s): INR, PROTIME in the last 168 hours. CBC: Recent Labs  Lab 06/22/20 1455 06/23/20 0706 06/24/20 0757  WBC 6.2 6.1 6.1  NEUTROABS 3.5  --  3.9  HGB 12.8 12.6 12.1  HCT 39.5 39.2 38.7  MCV 73.7* 73.4* 73.4*  PLT 376 382 331   Cardiac Enzymes: No results for  input(s): CKTOTAL, CKMB, CKMBINDEX, TROPONINI in the last 168 hours. BNP: Invalid input(s): POCBNP CBG: Recent Labs  Lab 06/23/20 0758 06/23/20 1806 06/23/20 2312 06/24/20 0728 06/24/20 1134  GLUCAP 159* 131* 119* 125* 115*   HbA1C: No results for input(s): HGBA1C in the last 72 hours. Urine analysis:    Component Value Date/Time   COLORURINE AMBER (A) 06/10/2020 1608   APPEARANCEUR HAZY (A) 06/10/2020 1608   LABSPEC 1.028 06/10/2020 1608   PHURINE 5.0 06/10/2020 1608   GLUCOSEU NEGATIVE 06/10/2020 1608   HGBUR NEGATIVE 06/10/2020 1608   BILIRUBINUR SMALL (A) 06/10/2020 1608   KETONESUR 5 (A) 06/10/2020 1608   PROTEINUR 30 (A) 06/10/2020 1608   NITRITE NEGATIVE 06/10/2020 1608   LEUKOCYTESUR NEGATIVE 06/10/2020 1608   Sepsis Labs: @LABRCNTIP (procalcitonin:4,lacticidven:4) ) Recent Results (from the past 240 hour(s))  Respiratory Panel by RT PCR (Flu A&B, Covid) - Nasopharyngeal Swab     Status: None   Collection Time: 06/22/20  6:00 PM   Specimen: Nasopharyngeal Swab  Result Value Ref Range Status   SARS Coronavirus 2 by RT PCR NEGATIVE NEGATIVE Final    Comment: (NOTE) SARS-CoV-2 target nucleic acids are NOT DETECTED.  The SARS-CoV-2 RNA is generally detectable in upper respiratoy specimens during the acute phase of infection. The lowest concentration of SARS-CoV-2 viral copies this assay  can detect is 131 copies/mL. A negative result does not preclude SARS-Cov-2 infection and should not be used as the sole basis for treatment or other patient management decisions. A negative result may occur with  improper specimen collection/handling, submission of specimen other than nasopharyngeal swab, presence of viral mutation(s) within the areas targeted by this assay, and inadequate number of viral copies (<131 copies/mL). A negative result must be combined with clinical observations, patient history, and epidemiological information. The expected result is  Negative.  Fact Sheet for Patients:  PinkCheek.be  Fact Sheet for Healthcare Providers:  GravelBags.it  This test is no t yet approved or cleared by the [place] FDA and  has been authorized for detection and/or diagnosis of SARS-CoV-2 by FDA under an Emergency Use Authorization (EUA). This EUA will remain  in effect (meaning this test can be used) for the duration of the COVID-19 declaration under Section 564(b)(1) of the Act, 21 U.S.C. section 360bbb-3(b)(1), unless the authorization is terminated or revoked sooner.     Influenza A by PCR NEGATIVE NEGATIVE Final   Influenza B by PCR NEGATIVE NEGATIVE Final    Comment: (NOTE) The Xpert Xpress SARS-CoV-2/FLU/RSV assay is intended as an aid in  the diagnosis of influenza from Nasopharyngeal swab specimens and  should not be used as a sole basis for treatment. Nasal washings and  aspirates are unacceptable for Xpert Xpress SARS-CoV-2/FLU/RSV  testing.  Fact Sheet for Patients: PinkCheek.be  Fact Sheet for Healthcare Providers: GravelBags.it  This test is not yet approved or cleared by the [place] FDA and  has been authorized for detection and/or diagnosis of SARS-CoV-2 by  FDA under an Emergency Use Authorization (EUA). This EUA will remain  in effect (meaning this test can be used) for the duration of the  Covid-19 declaration under Section 564(b)(1) of the Act, 21  U.S.C. section 360bbb-3(b)(1), unless the authorization is  terminated or revoked. Performed at [facility], [address] [place], [place] [address]   Body fluid culture     Status: None (Preliminary result)   Collection Time: 06/23/20 11:55 AM   Specimen: Abdomen; Peritoneal Fluid  Result Value Ref Range Status   Specimen Description PERITONEAL FLUID  Final   Special Requests ABDOMEN  Final   Gram Stain   Final    RARE WBC PRESENT,  PREDOMINANTLY MONONUCLEAR NO ORGANISMS SEEN    Culture   Final    NO GROWTH < 24 HOURS Performed at [facility], [place] [address], [place], [place] [address]    Report Status PENDING  Incomplete     Scheduled Meds: . apixaban  5 mg Oral BID  . Chlorhexidine Gluconate Cloth  6 each Topical Daily  . dronabinol  2.5 mg Oral BID AC  . insulin aspart  0-9 Units Subcutaneous TID WC  . morphine  60 mg Oral Q12H  . oxyCODONE-acetaminophen  2 tablet Oral Q6H   And  . oxyCODONE  10 mg Oral Q6H   Continuous Infusions: . sodium chloride      Procedures/Studies: CT ABDOMEN PELVIS W CONTRAST  Result Date: 06/10/2020 CLINICAL DATA:  Abdominal pain, tenderness and swelling, receiving chemotherapy for pancreatic cancer EXAM: CT ABDOMEN AND PELVIS WITH CONTRAST TECHNIQUE: Multidetector CT imaging of the abdomen and pelvis was performed using the standard protocol following bolus administration of intravenous contrast. CONTRAST:  18mL OMNIPAQUE IOHEXOL 300 MG/ML  SOLN COMPARISON:  CT chest, abdomen and pelvis 05/28/2020. FINDINGS: Lower chest: Redemonstration of the scattered sub 5  mm pulmonary nodules in both lower lobes, right middle lobe and lingula compatible with suspected metastatic disease. Lung bases are otherwise clear. Normal heart size. No pericardial effusion. Hepatobiliary: Redemonstration of multiple hepatic metastases demonstrated by some rim enhancement and central hypoattenuation, largest in the inferior right lobe liver measuring up to 3 cm which is not significantly changed in size from the comparison exam when measured at a similar level. Largest lesion in the left lobe liver measures up to 1.7 cm, also unchanged from prior. Few subcentimeter hypoattenuating foci present in the superior right lobe (2/11, 12) are better visualized than on comparison. Normal hepatic attenuation. Smooth liver surface contour. No visible calcified gallstones. Mild gallbladder wall thickening,  nonspecific given some adjacent ascites. No biliary ductal dilatation. Pancreas: Redemonstration of the heterogeneous, hypoattenuating 3.9 by 5.1 cm mass involving the pancreatic tail/body (2/32). Mass appears to encase the proximal celiac axis, splenic artery and vein as well as portion of the body of the left adrenal gland, overall similar in appearance to comparison study. Spleen: Normal in size. No concerning splenic lesions. Adrenals/Urinary Tract: Involvement of the left adrenal gland by the pancreatic mass, as detailed above. No concerning right adrenal nodules. Asymmetric scarring of the interpolar right kidney, similar to comparison. Few parapelvic cysts are noted. Nonobstructing calculus present in the lower pole right kidney. No concerning renal masses. No urolithiasis or hydronephrosis. Urinary bladder is largely decompressed at the time of exam and therefore poorly evaluated by CT imaging. No gross bladder abnormality is evident. Some mild perivesicular hazy stranding is nonspecific however. Stomach/Bowel: Distal esophagus is unremarkable. There is some mild thickening at the gastric antrum with mucosal hyperemia, nonspecific and possibly related to normal peristalsis though could correlate for features of gastritis. No small bowel thickening or dilatation. Small air-filled retrocecal appendix. No colonic dilatation or wall thickening. Scattered colonic diverticula without focal inflammation to suggest diverticulitis. No clear mechanical obstruction. Vascular/Lymphatic: Atherosclerotic calcifications within the abdominal aorta and branch vessels. No aneurysm or ectasia. No discernible enlarged abdominopelvic lymph nodes. Reproductive: Post hysterectomy. Nodular changes along the superior vaginal cuff are again difficult to visualize though may be present (2/78). No other concerning adnexal lesions. Other: Increasing peritoneal nodularity/omental caking with increasing moderate volume loculated abdominal  ascites throughout the abdomen. No free intraperitoneal air. No bowel containing hernias. Mild body wall edema. Musculoskeletal: Multilevel degenerative changes are present in the imaged portions of the spine. No acute osseous abnormality or suspicious osseous lesion. IMPRESSION: 1. Increasing peritoneal nodularity/omental caking with increasing moderate volume possibly loculated malignant ascites throughout the abdomen. 2. Redemonstration of the heterogeneous, hypoattenuating mass involving the pancreatic tail/body with encasement of the proximal celiac axis, splenic artery and vein as well as portion of the body of the left adrenal gland. 3. Multiple rim enhancing, centrally hypoattenuating liver lesions compatible with hepatic metastatic disease. 4. Redemonstration of the scattered sub 5 mm pulmonary nodules in the lung bases, compatible with suspected metastatic disease. 5. Mild thickening at the gastric antrum with mucosal hyperemia, nonspecific and possibly related to normal peristalsis though could correlate for features of gastritis. 6. Nonobstructing right nephrolithiasis and right renal cortical scarring. 7. Mild perivesicular hazy stranding is nonspecific however recommend correlation with urinalysis to exclude infection. 8. Aortic Atherosclerosis (ICD10-I70.0). Electronically Signed   By: Lovena Le M.D.   On: 06/10/2020 20:54   CT CHEST ABDOMEN PELVIS W CONTRAST  Addendum Date: 05/28/2020   ADDENDUM REPORT: 05/28/2020 13:36 ADDENDUM: Critical Value/emergent results were called by telephone at the time  of interpretation on 05/28/2020 at 1:30 pm to provider [facility] , who verbally acknowledged these results. Electronically Signed   By: Julian Hy M.D.   On: 05/28/2020 13:36   Result Date: 05/28/2020 CLINICAL DATA:  Metastatic pancreatic cancer EXAM: CT CHEST, ABDOMEN, AND PELVIS WITH CONTRAST TECHNIQUE: Multidetector CT imaging of the chest, abdomen and pelvis was performed  following the standard protocol during bolus administration of intravenous contrast. CONTRAST:  120mL OMNIPAQUE IOHEXOL 300 MG/ML  SOLN COMPARISON:  02/07/2020 FINDINGS: CT CHEST FINDINGS Cardiovascular: The heart is normal in size. No pericardial effusion. No evidence of thoracic aortic aneurysm. Atherosclerotic calcifications of the aortic arch. Although not tailored for evaluation of the pulmonary arteries, incidental segmental/subsegmental right lower lobe pulmonary emboli are suspected (series 3/images 91 and 100). Overall clot burden is small. No findings to suggest right heart strain (RV to LV ratio 0.87). Right chest port terminates at the cavoatrial junction. Mediastinum/Nodes: No suspicious mediastinal lymphadenopathy. 9 mm short axis left perihilar node (series 3/image 77), grossly unchanged. Visualized thyroid is unremarkable. Lungs/Pleura: Numerous scattered small pulmonary nodules throughout all lobes, measuring up to 6 mm in the left lower lobe (series 10/images 74, 76, and 101), previously 4-5 mm. This appearance remains compatible with metastatic disease. Mild centrilobular emphysematous changes, upper lung predominant. No focal consolidation. No pleural effusion or pneumothorax. Musculoskeletal: Degenerative changes of the thoracic spine. CT ABDOMEN PELVIS FINDINGS Hepatobiliary: Multiple hepatic metastases, progressive, now measuring up to 2.3 cm inferiorly in the right hepatic lobe (series 3/image 168). Previously, image 6 single live mm lesion. Gallbladder is unremarkable. No intrahepatic or extrahepatic ductal dilatation. Pancreas: 3.9 x 5.1 cm mass involving the pancreatic body/proximal tail (series 3/image 169), previously 3.7 x 5.5 cm, grossly unchanged. Mass encases the celiac trunk and directly invades the left adrenal gland. Spleen: Within normal limits. Adrenals/Urinary Tract: Left renal invasion, as above. Right adrenal gland is within normal limits. Left kidney is within normal limits.  Stable right renal scarring with 7 mm nonobstructing right lower pole renal calculus (series 3/image 213) and fullness of the right renal collecting system. Bladder is underdistended and poorly evaluated. Stomach/Bowel: Stomach is within normal limits. No evidence of bowel obstruction. No colonic wall thickening or mass is evident on CT. Vascular/Lymphatic: No evidence of abdominal aortic aneurysm. Atherosclerotic calcifications of the abdominal aorta and branch vessels. No suspicious abdominopelvic lymphadenopathy. Reproductive: Status post hysterectomy. Pelvic nodularity at the vaginal apex is poorly visualized on the current CT. No adnexal masses. Other: Small to moderate abdominopelvic ascites, new. Associated mild peritoneal disease/omental caking. Musculoskeletal: Degenerative changes of the lumbar spine. IMPRESSION: Although not tailored for evaluation of the pulmonary arteries, there are incidental segmental/subsegmental right lower lobe pulmonary emboli. Overall clot burden is small. No findings to suggest right heart strain. 5.1 cm pancreatic mass, corresponding to the patient's known pancreatic adenocarcinoma, grossly unchanged. Associated vascular encasement. Direct invasion of the left adrenal gland. Progressive hepatic metastases. Small to moderate abdominopelvic ascites with mild peritoneal disease/omental caking, new. Multifocal pulmonary metastases measuring up to 6 mm, mildly progressive. 9 mm short axis left perihilar node, grossly unchanged. Electronically Signed: By: Julian Hy M.D. On: 05/28/2020 13:26   US Paracentesis  Result Date: 06/23/2020 INDICATION: Patient with a history of a metastatic pancreatic cancer and recurrent ascites. Interventional radiology asked to perform a therapeutic and diagnostic paracentesis. EXAM: ULTRASOUND GUIDED scratch PARACENTESIS MEDICATIONS: 1% lidocaine 10 mL COMPLICATIONS: None immediate. PROCEDURE: Informed written consent was obtained from the  patient after a discussion of  the risks, benefits and alternatives to treatment. A timeout was performed prior to the initiation of the procedure. Initial ultrasound scanning demonstrates a large amount of ascites within the left lower abdominal quadrant. The left lower abdomen was prepped and draped in the usual sterile fashion. 1% lidocaine was used for local anesthesia. Following this, a 19 gauge, 10-cm, Yueh catheter was introduced. An ultrasound image was saved for documentation purposes. The paracentesis was performed. The catheter was removed and a dressing was applied. The patient tolerated the procedure well without immediate post procedural complication. FINDINGS: A total of approximately 3.8 L of clear yellow fluid was removed. Samples were sent to the laboratory as requested by the clinical team. IMPRESSION: Successful ultrasound-guided paracentesis yielding 3.8 liters of peritoneal fluid. Read by: Soyla Dryer, NP Electronically Signed   By: Jerilynn Mages.  Shick M.D.   On: 06/23/2020 12:06   US Paracentesis  Result Date: 06/14/2020 INDICATION: Malignant ascites EXAM: ULTRASOUND GUIDED LLQ PARACENTESIS MEDICATIONS: 10 cc 1% lidocaine COMPLICATIONS: None immediate. PROCEDURE: Informed written consent was obtained from the patient after a discussion of the risks, benefits and alternatives to treatment. A timeout was performed prior to the initiation of the procedure. Initial ultrasound scanning demonstrates a large amount of ascites within the left lower abdominal quadrant. The left lower abdomen was prepped and draped in the usual sterile fashion. 1% lidocaine was used for local anesthesia. Following this, a 13 G Yueh catheter was introduced. An ultrasound image was saved for documentation purposes. The paracentesis was performed. The catheter was removed and a dressing was applied. The patient tolerated the procedure well without immediate post procedural complication. Patient received post-procedure  intravenous albumin; see nursing notes for details. FINDINGS: A total of approximately 3 liters of dark yellow fluid was removed. IMPRESSION: Successful ultrasound-guided paracentesis yielding 3 liters of peritoneal fluid. Read by Lavonia Drafts [facility] Electronically Signed   By: Lavonia Dana M.D.   On: 06/14/2020 11:09   US Paracentesis  Result Date: 06/11/2020 INDICATION: Ascites EXAM: ULTRASOUND GUIDED left PARACENTESIS MEDICATIONS: None. COMPLICATIONS: None immediate. PROCEDURE: Informed written consent was obtained from the patient after a discussion of the risks (including hemorrhage, infection, and damage to adjacent structures, among others), benefits and alternatives to treatment. A timeout was performed prior to the initiation of the procedure. Initial ultrasound scanning demonstrates a small amount of ascites within the left lower abdominal quadrant. The left lower abdomen was prepped and draped in the usual sterile fashion. 1% lidocaine was used for local anesthesia. Following this, a Yueh catheter was introduced. An ultrasound image was saved for documentation purposes. The paracentesis was performed but only 20 cc of serosanguineous fluid could be aspirated. The catheter was removed and a dressing was applied. The patient tolerated the procedure well without immediate post procedural complication. FINDINGS: A total of approximately 20 cc of serosanguineous fluid was removed. Samples were sent to the laboratory as requested by the clinical team. IMPRESSION: Ultrasound-guided paracentesis yielding 20 cc of peritoneal fluid. Electronically Signed   By: Van Clines M.D.   On: 06/11/2020 12:08   DG Chest Port 1 View  Result Date: 06/22/2020 CLINICAL DATA:  Shortness of breath EXAM: PORTABLE CHEST 1 VIEW COMPARISON:  06/10/2020 chest and abdominal radiographs. FINDINGS: Right chest wall Port-A-Cath tip overlies the superior cavoatrial junction. No focal consolidation, pneumothorax or pleural  effusion. Cardiomediastinal silhouette is within normal limits. No acute osseous abnormality. IMPRESSION: No acute airspace disease. Electronically Signed   By: Milus Mallick.D.  On: 06/22/2020 14:57   DG Abdomen Acute W/Chest  Result Date: 06/10/2020 CLINICAL DATA:  59 year old with upper abdominal pain, tenderness and swelling. Current history of pancreatic cancer for which the patient is receiving chemotherapy. Current smoker. EXAM: ACUTE ABDOMEN SERIES (2 VIEW ABDOMEN AND 1 VIEW CHEST) COMPARISON:  CT chest, abdomen and pelvis 05/28/2020 and earlier. FINDINGS: Mild gaseous distension of the rectum in the transverse colon. Moderate colonic stool burden. No evidence of small-bowel distention. No evidence of free air or significant air-fluid levels on the ERECT image. Phleboliths in both sides of the pelvis. No visible opaque urinary tract calculi. Degenerative changes involving the lower lumbar spine. Cardiomediastinal silhouette unremarkable. RIGHT jugular Port-A-Cath tip projects at or near the cavoatrial junction. Mild emphysematous changes throughout both lungs as noted on the prior CT. The lung nodules identified on CT are inconspicuous on the x-ray. No confluent or ground-glass airspace consolidation. No pleural effusions. IMPRESSION: 1. No evidence of bowel obstruction or free intraperitoneal air. 2. Moderate colonic stool burden. 3. Mild COPD/emphysema. No acute cardiopulmonary disease.  S Electronically Signed   By: Evangeline Dakin M.D.   On: 06/10/2020 16:41    Orson Eva, DO  [facility]  If 7PM-7AM, please contact night-coverage [email or web address] Password [email or web address] 06/24/2020, 12:16 PM   LOS: 1 day

## 2020-06-25 ENCOUNTER — Other Ambulatory Visit (HOSPITAL_COMMUNITY): Payer: Medicare HMO

## 2020-06-25 ENCOUNTER — Encounter (HOSPITAL_COMMUNITY): Payer: Self-pay | Admitting: Internal Medicine

## 2020-06-25 ENCOUNTER — Ambulatory Visit (HOSPITAL_COMMUNITY): Payer: Medicare HMO

## 2020-06-25 ENCOUNTER — Inpatient Hospital Stay (HOSPITAL_COMMUNITY): Payer: Medicare HMO

## 2020-06-25 ENCOUNTER — Ambulatory Visit (HOSPITAL_COMMUNITY): Payer: Medicare HMO | Admitting: Hematology

## 2020-06-25 DIAGNOSIS — Z515 Encounter for palliative care: Secondary | ICD-10-CM

## 2020-06-25 DIAGNOSIS — Z7189 Other specified counseling: Secondary | ICD-10-CM

## 2020-06-25 DIAGNOSIS — C259 Malignant neoplasm of pancreas, unspecified: Secondary | ICD-10-CM

## 2020-06-25 DIAGNOSIS — E860 Dehydration: Secondary | ICD-10-CM

## 2020-06-25 DIAGNOSIS — R180 Malignant ascites: Secondary | ICD-10-CM

## 2020-06-25 LAB — BASIC METABOLIC PANEL
Anion gap: 11 (ref 5–15)
BUN: 15 mg/dL (ref 6–20)
CO2: 26 mmol/L (ref 22–32)
Calcium: 8.3 mg/dL — ABNORMAL LOW (ref 8.9–10.3)
Chloride: 97 mmol/L — ABNORMAL LOW (ref 98–111)
Creatinine, Ser: 0.69 mg/dL (ref 0.44–1.00)
GFR calc Af Amer: >60 mL/min (ref >60)
GFR calc non Af Amer: >60 mL/min (ref >60)
Glucose, Bld: 123 mg/dL — ABNORMAL HIGH (ref 70–99)
Potassium: 4.3 mmol/L (ref 3.5–5.1)
Sodium: 134 mmol/L — ABNORMAL LOW (ref 135–145)

## 2020-06-25 LAB — MAGNESIUM: Magnesium: 2.1 mg/dL (ref 1.7–2.4)

## 2020-06-25 LAB — GLUCOSE, CAPILLARY
Glucose-Capillary: 142 mg/dL — ABNORMAL HIGH (ref 70–99)
Glucose-Capillary: 148 mg/dL — ABNORMAL HIGH (ref 70–99)

## 2020-06-25 LAB — PATHOLOGIST SMEAR REVIEW

## 2020-06-25 MED ORDER — SENNA 8.6 MG PO TABS
2.0000 | ORAL_TABLET | Freq: Every day | ORAL | Status: DC
  Administered 2020-06-26: 17.2 mg via ORAL
  Filled 2020-06-25: qty 2

## 2020-06-25 MED ORDER — SENNA 8.6 MG PO TABS: 1.0000 | ORAL_TABLET | Freq: Every day | ORAL | Status: DC

## 2020-06-25 MED ORDER — LACTULOSE 10 GM/15ML PO SOLN
20.0000 g | Freq: Once | ORAL | Status: AC
  Administered 2020-06-25: 20 g via ORAL
  Filled 2020-06-25: qty 30

## 2020-06-25 NOTE — Consult Note (Signed)
Consultation Note Date: 06/25/2020   Patient Name: Doris [name]  DOB: 12-14-1960  MRN: [id number]  Age / Sex: 59 y.o., female  PCP: Merdis Delay, DO Referring Physician: Orson Eva, MD  Reason for Consultation: Establishing goals of care  HPI/Patient Profile: 59 y.o. female  with past medical history of pancreatic cancer with mets to liver and lung, PE on apixaban, diabetes, hypertension admitted on 06/22/2020 with worsening abdominal pain, bloating, swelling. Also with decreased oral intake and weakness. Uncontrolled pain with home doses of MS Contin and dilaudid. Considering home with hospice.   Clinical Assessment and Goals of Care: I met today with Doris [name]. She was resting in bed when I arrived and she was able to sit up in bed and ambulate to restroom independently. She reports that her pain does seem improved today (MS Contin was increased last night). She is happy with current level of control. She is still receiving some IV dilaudid and may need increase in MS Contin again or even increase in oxycodone dosage/frequency for breakthrough pain. She expresses concern for getting adequate pain control at home. She is tired of being in the hospital and wants to get back home where she lives with her brother and her father/stepmother live very close by. She reports that she has very good family support.   We further discussed her goals of care. I mentioned the notes from nursing that allude to comments that she is tired and I asked Doris [name] what this means. She expresses that she is tired but feels torn between trying to push forward with aggressive care vs just going home and focusing on her comfort. We further discussed these two pathways and the risks vs benefits of chemotherapy (if this is still an option for her) and the fact that this is not going to cure her cancer but also carries side effects of her own  that could lead to further hospitalization as well. We discussed comfort at home and help from hospice to assist with pain management in order to optimize her quality of life. After discussing and thinking she tells me that she wishes to go home with hospice because she is tired and doesn't think she could tolerate further chemotherapy. She is tired of being in pain and just wants to be at home.   However, she also tells me that she feels that the fluid in her abdomen is returning. We discussed options to manage this if ascites are re-accumulating quickly and potential for peritoneal drain. We discussed that this does carry risk of infection if she is a candidate for drain placement. She did have LLQ paracentesis 9/16 3L removed and 9/25 3.8L removed. She is interested in drain placement if she is a candidate vs recurrent paracentesis and knows that hospice can assist to manage this at home as well.   All questions/concerns addressed. Emotional support provided. Discussed with Dr. Carles Collet,   [place] with hospice - Requesting repeat  paracentesis vs peritoneal drain placement (for comfort with plans for home hospice)  Code Status/Advance Care Planning:  DNR   Symptom Management:   Ongoing abd pain related to cancer: MS Contin 60 mg po every 12 hours first dose given last night. Will monitor efficacy throughout today and if still requiring frequent prn medications may consider increasing dose tonight. If medication seems to be wearing off sooner than 12 hours may also consider spreading out dosage to q8h instead of q12h. Consider OxyIR 20 mg q4h (instead of q6h) as needed for breakthrough pain with hopes of avoiding need for IV dilaudid. May give Tylenol 1000 TID and eliminate Percocet and use OxyIR instead. This may make home management easier for her as well. IR consulted for peritoneal drain placement.   Bowel Regimen: Senokot 2 tablets  qhs. May increase as needed. LBM 9/22. Lactulose x 1 today.   Palliative Prophylaxis:   Bowel Regimen, Delirium Protocol and Frequent Pain Assessment  Psycho-social/Spiritual:   Desire for further Chaplaincy support:no - offered and pt declined  Additional Recommendations: Education on Hospice and Grief/Bereavement Support  Prognosis:   < 3 months likely  Discharge Planning: Home with Hospice      Primary Diagnoses: Present on Admission: . Malignant ascites . Pancreatic cancer metastasized to liver ([place]) . History of pulmonary embolus (PE) . Hyperglycemia due to diabetes mellitus ([place]) . Hyponatremia   I have reviewed the medical record, interviewed the patient and family, and examined the patient. The following aspects are pertinent.  Past Medical History:  Diagnosis Date  . Diabetes mellitus without complication ([place])   . Family history of prostate cancer   . Family history of stomach cancer   . Hypertension   . Pancreatic cancer ([place])   . Port-A-Cath in place 05/16/2019   Social History   Socioeconomic History  . Marital status: Divorced    Spouse name: Not on file  . Number of children: 1  . Years of education: Not on file  . Highest education level: Not on file  Occupational History  . Occupation: disabled  Tobacco Use  . Smoking status: Current Every Day Smoker    Packs/day: 1.00    Years: 45.00    Pack years: 45.00    Types: Cigarettes  . Smokeless tobacco: Never Used  Vaping Use  . Vaping Use: Never used  Substance and Sexual Activity  . Alcohol use: Not Currently  . Drug use: Never  . Sexual activity: Not on file  Other Topics Concern  . Not on file  Social History Narrative  . Not on file   Social Determinants of Health   Financial Resource Strain:   . Difficulty of Paying Living Expenses: Not on file  Food Insecurity:   . Worried About Charity fundraiser in the Last Year: Not on file  . Ran Out of Food in the Last Year: Not on file   Transportation Needs:   . Lack of Transportation (Medical): Not on file  . Lack of Transportation (Non-Medical): Not on file  Physical Activity:   . Days of Exercise per Week: Not on file  . Minutes of Exercise per Session: Not on file  Stress:   . Feeling of Stress : Not on file  Social Connections:   . Frequency of Communication with Friends and Family: Not on file  . Frequency of Social Gatherings with Friends and Family: Not on file  . Attends Religious Services: Not on file  . Active Member of Clubs  or Organizations: Not on file  . Attends Archivist Meetings: Not on file  . Marital Status: Not on file   Family History  Problem Relation Age of Onset  . Diabetes Mother   . Hypertension Mother   . Lung cancer Mother 35       d. 22  . Diabetes Father   . Hypertension Father   . Heart disease Brother   . Stomach cancer Paternal Aunt 45  . Stroke Paternal Grandmother   . Prostate cancer Other        PGMs brother  . Stomach cancer Other        PGMs mother   Scheduled Meds: . apixaban  5 mg Oral BID  . Chlorhexidine Gluconate Cloth  6 each Topical Daily  . dronabinol  2.5 mg Oral BID AC  . insulin aspart  0-9 Units Subcutaneous TID WC  . morphine  60 mg Oral Q12H  . oxyCODONE-acetaminophen  2 tablet Oral Q6H   And  . oxyCODONE  10 mg Oral Q6H   Continuous Infusions: . sodium chloride     PRN Meds:.diphenoxylate-atropine, HYDROmorphone (DILAUDID) injection, lactulose, ondansetron **OR** ondansetron (ZOFRAN) IV Allergies  Allergen Reactions  . Hydrocodone Hives  . Tramadol Itching   Review of Systems  Constitutional: Positive for appetite change and fatigue.  Gastrointestinal: Positive for abdominal pain.  Neurological: Positive for weakness.    Physical Exam Vitals and nursing note reviewed.  Constitutional:      General: She is not in acute distress.    Appearance: She is ill-appearing.  Cardiovascular:     Rate and Rhythm: Tachycardia present.   Pulmonary:     Effort: Pulmonary effort is normal. No tachypnea, accessory muscle usage or respiratory distress.  Abdominal:     Tenderness: There is abdominal tenderness.  Musculoskeletal:     Right lower leg: 2+ Pitting Edema present.     Left lower leg: 2+ Pitting Edema present.  Neurological:     Mental Status: She is alert and oriented to person, place, and time.     Vital Signs: BP 115/77 (BP Location: Right Arm)   Pulse (!) 101   Temp 98.4 F (36.9 C) (Oral)   Resp 17   Ht 5' 10" (1.778 m)   Wt 105 kg   SpO2 100%   BMI 33.21 kg/m  Pain Scale: 0-10   Pain Score: 2    SpO2: SpO2: 100 % O2 Device:SpO2: 100 % O2 Flow Rate: .   IO: Intake/output summary: No intake or output data in the 24 hours ending 06/25/20 0928  LBM: Last BM Date: 06/21/20 Baseline Weight: Weight: 105 kg Most recent weight: Weight: 105 kg     Palliative Assessment/Data:     Time In: 1020 Time Out: 1115 Time Total: 55 min Greater than 50%  of this time was spent counseling and coordinating care related to the above assessment and plan.  Signed by: Vinie Sill, NP Palliative Medicine Team Pager # [phone] (M-F 8a-5p) Team Phone # [phone] (Nights/Weekends)

## 2020-06-25 NOTE — Progress Notes (Addendum)
PROGRESS NOTE  Rosabell Geyer GYJ:[id number] DOB: 12/09/1960 DOA: 06/22/2020 PCP: Merdis Delay, DO  Brief History: 59 year old female with a history of pulmonary embolus on apixaban diabetes mellitus type 2, hypertension, and metastatic pancreatic cancer to the liver and lung presenting with 1 day history of increasing abdominal pain and increasing bloating and swelling. She complains of continued decreased oral intake and generalized weakness. She denies any fevers, chills, headache, chest pain, shortness breath, coughing, hemoptysis, vomiting, diarrhea, dysuria, hematuria, hematochezia, melena. She states that she has been taking her home opioids including MS Contin and Dilaudid without relief. As result, she came to the hospital for further evaluation. Notably, the patient was diagnosed with pancreatic cancer in August 2020. She follows Dr. Delton Coombes at the [facility]. She is currently on salvage therapy. The patient was most recently admitted to the hospital from 06/10/2020 to 06/14/2020 with similar complaints. Her last paracentesis was on 06/14/2020 removing 3 L. There was difficulty with her previous paracentesis due to the loculated nature of her ascites. In the emergency department, the patient was afebrile hemodynamically stable with oxygen saturation 99% room air. BMP showed a sodium 128, but BMP was otherwise unremarkable. LFTs were unremarkable. Lactic acid was 1.2. CBC was unremarkable. The patient was started on IV Dilaudid. Interventional radiology was consulted to assist with possible ultrasound-guided paracentesis.  Assessment/Plan: Uncontrolled abdominal pain/malignant ascites -06/23/20 paracentesis--3.8L -06/25/20-paracentesis--3L -Given the loculated nature of the patient's ascites, there has been concern to perform paracentesis without ultrasound guidance -increase IV Dilaudid 2 mg every 2 hours as needed severe pain -Increase MS Contin 60  mg every 12 hours -Continue home dose of Percocet -PMP AWARE reviewed--last Percocet prescription#2409/20/2021, MS Contin 06/06/20 #60  Metastatic pancreatic cancer to liver and lung -Patient follows with Dr. Delton Coombes in the [facility] -Currently on salvage therapy -06/10/2020 CT abdomen/pelvis--increasing abdominal caking and peritoneal nodularity;moderate loculated malignant ascites;heterogeneous and hypoattenuating mass in the pancreatic body and tail with encasement of the proximal celiac axis, splenic artery and vein and left adrenal gland;hepatic and lung mets present -appreciate Dr. Bonne Dolores with palliative approach  Pulmonary embolus history -Diagnosed 05/28/2020 -Continue apixabanafter paracentesis  Uncontrolled diabetes mellitus type 2 with hyperglycemia -06/10/2020 hemoglobin A1c 8.8 -NovoLog sliding scale -CBGs controlled -discontinue CBG checks per patient request  Essential hypertension -Holding amlodipine and lisinopril for now and monitor blood pressures -BP has been well controlled  Failure to thrive/severe malnutrition -Continue supplements -Continue Marinol -B12--805 -Folate--14.3 -Continue IV fluids-->discontinue per patient request  Hyponatremia -Secondary to volume depletion and poor solute intake -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  Dispo: The patient is from:Home Anticipated d/c is YO:[facility] Anticipated d/c date is: 1-2 days Patient currently is not medically stable to  d/c.        Family Communication:father updated 9/26  Consultants:IR, palliative  Code Status: DNR  DVT Prophylaxis:apixaban   Procedures: As Listed in Progress Note Above  Antibiotics: None     Subjective: Patient states abd pain is 25% better with increased morphine and IV dilaudid.  Denies f/c, cp, sob, n/v/d  Objective: Vitals:  06/25/20 1314 06/25/20 1427 06/25/20 1440 06/25/20 1510  BP: 130/88 (!) 143/79 (!) 143/98 (!) 142/92  Pulse: (!) 107 (!) 105 99 96  Resp: 20 20 18 20   Temp: 98.1 F (36.7 C) 98.8 F (37.1 C)  98.7 F (37.1 C)  TempSrc: Oral Oral  Oral  SpO2: 99% 98% 98% 98%  Weight:      Height:        Intake/Output Summary (Last 24 hours) at 06/25/2020 1701 Last data filed at 06/25/2020 1418 Gross per 24 hour  Intake 240 ml  Output --  Net 240 ml   Weight change:  Exam:   General:  Pt is alert, follows commands appropriately, not in acute distress  HEENT: No icterus, No thrush, No neck mass, [place]/AT  Cardiovascular: RRR, S1/S2, no rubs, no gallops  Respiratory: CTA bilaterally, no wheezing, no crackles, no rhonchi  Abdomen: Soft/+BS, non tender, non distended, no guarding  Extremities: No edema, No lymphangitis, No petechiae, No rashes, no synovitis   Data Reviewed: I have personally reviewed following labs and imaging studies Basic Metabolic Panel: Recent Labs  Lab 06/22/20 1455 06/23/20 0706 06/24/20 0757 06/25/20 0643  NA 128* 129* 132* 134*  K 4.4 4.5 4.7 4.3  CL 91* 91* 97* 97*  CO2 26 25 27 26   GLUCOSE 174* 148* 132* 123*  BUN 22* 22* 17 15  CREATININE 0.97 1.04* 0.82 0.69  CALCIUM 8.6* 8.9 8.6* 8.3*  MG  --   --  2.1 2.1   Liver Function Tests: Recent Labs  Lab 06/22/20 1455  AST 18  ALT 16  ALKPHOS 91  BILITOT 0.7  PROT 6.1*  ALBUMIN 2.9*   Recent Labs  Lab 06/22/20 1455  LIPASE 19   No results for input(s): AMMONIA in the last 168 hours. Coagulation Profile: No results for  input(s): INR, PROTIME in the last 168 hours. CBC: Recent Labs  Lab 06/22/20 1455 06/23/20 0706 06/24/20 0757  WBC 6.2 6.1 6.1  NEUTROABS 3.5  --  3.9  HGB 12.8 12.6 12.1  HCT 39.5 39.2 38.7  MCV 73.7* 73.4* 73.4*  PLT 376 382 331   Cardiac Enzymes: No results for input(s): CKTOTAL, CKMB, CKMBINDEX, TROPONINI in the last 168 hours. BNP: Invalid input(s): POCBNP CBG: Recent Labs  Lab 06/23/20 2312 06/24/20 0728 06/24/20 1134 06/25/20 0759 06/25/20 1117  GLUCAP 119* 125* 115* 142* 148*   HbA1C: No results for input(s): HGBA1C in the last 72 hours. Urine analysis:    Component Value Date/Time   COLORURINE AMBER (A) 06/10/2020 1608   APPEARANCEUR HAZY (A) 06/10/2020 1608   LABSPEC 1.028 06/10/2020 1608   PHURINE 5.0 06/10/2020 1608   GLUCOSEU NEGATIVE 06/10/2020 1608   HGBUR NEGATIVE 06/10/2020 1608   BILIRUBINUR SMALL (A) 06/10/2020 1608   KETONESUR 5 (A) 06/10/2020 1608   PROTEINUR 30 (A) 06/10/2020 1608   NITRITE NEGATIVE 06/10/2020 1608   LEUKOCYTESUR NEGATIVE 06/10/2020 1608   Sepsis Labs: @LABRCNTIP (procalcitonin:4,lacticidven:4) ) Recent Results (from the past 240 hour(s))  Respiratory Panel by RT PCR (Flu A&B, Covid) - Nasopharyngeal Swab     Status: None   Collection Time: 06/22/20  6:00 PM   Specimen: Nasopharyngeal Swab  Result Value Ref Range Status   SARS Coronavirus 2 by RT PCR NEGATIVE NEGATIVE Final    Comment: (NOTE) SARS-CoV-2 target nucleic acids are NOT DETECTED.  The SARS-CoV-2 RNA is generally detectable in upper respiratoy specimens during the acute phase of infection. The lowest concentration of SARS-CoV-2 viral copies this assay  can detect is 131 copies/mL. A negative result does not preclude SARS-Cov-2 infection and should not be used as the sole basis for treatment or other patient management decisions. A negative result may occur with  improper specimen collection/handling, submission of specimen other than nasopharyngeal swab,  presence of viral mutation(s) within the areas targeted by this assay, and inadequate number of viral copies (<131 copies/mL). A negative result must be combined with clinical observations, patient history, and epidemiological information. The expected result is Negative.  Fact Sheet for Patients:  PinkCheek.be  Fact Sheet for Healthcare Providers:  GravelBags.it  This test is no t yet approved or cleared by the [place] FDA and  has been authorized for detection and/or diagnosis of SARS-CoV-2 by FDA under an Emergency Use Authorization (EUA). This EUA will remain  in effect (meaning this test can be used) for the duration of the COVID-19 declaration under Section 564(b)(1) of the Act, 21 U.S.C. section 360bbb-3(b)(1), unless the authorization is terminated or revoked sooner.     Influenza A by PCR NEGATIVE NEGATIVE Final   Influenza B by PCR NEGATIVE NEGATIVE Final    Comment: (NOTE) The Xpert Xpress SARS-CoV-2/FLU/RSV assay is intended as an aid in  the diagnosis of influenza from Nasopharyngeal swab specimens and  should not be used as a sole basis for treatment. Nasal washings and  aspirates are unacceptable for Xpert Xpress SARS-CoV-2/FLU/RSV  testing.  Fact Sheet for Patients: PinkCheek.be  Fact Sheet for Healthcare Providers: GravelBags.it  This test is not yet approved or cleared by the [place] FDA and  has been authorized for detection and/or diagnosis of SARS-CoV-2 by  FDA under an Emergency Use Authorization (EUA). This EUA will remain  in effect (meaning this test can be used) for the duration of the  Covid-19 declaration under Section 564(b)(1) of the Act, 21  U.S.C. section 360bbb-3(b)(1), unless the authorization is  terminated or revoked. Performed at [facility], [address], [place], [place] [address]   Body fluid culture      Status: None (Preliminary result)   Collection Time: 06/23/20 11:55 AM   Specimen: Abdomen; Peritoneal Fluid  Result Value Ref Range Status   Specimen Description PERITONEAL FLUID  Final   Special Requests ABDOMEN  Final   Gram Stain   Final    RARE WBC PRESENT, PREDOMINANTLY MONONUCLEAR NO ORGANISMS SEEN    Culture   Final    NO GROWTH 2 DAYS Performed at [facility], [place] [address], [place], [place] [address]    Report Status PENDING  Incomplete     Scheduled Meds: . apixaban  5 mg Oral BID  . Chlorhexidine Gluconate Cloth  6 each Topical Daily  . dronabinol  2.5 mg Oral BID AC  . insulin aspart  0-9 Units Subcutaneous TID WC  . morphine  60 mg Oral Q12H  . oxyCODONE-acetaminophen  2 tablet Oral Q6H   And  . oxyCODONE  10 mg Oral Q6H  . [START ON 06/26/2020] senna  2 tablet Oral QHS   Continuous Infusions:  Procedures/Studies: CT ABDOMEN PELVIS W CONTRAST  Result Date: 06/10/2020 CLINICAL DATA:  Abdominal pain, tenderness and swelling, receiving chemotherapy for pancreatic cancer EXAM: CT ABDOMEN AND PELVIS WITH CONTRAST TECHNIQUE: Multidetector CT imaging of the abdomen and pelvis was performed using the standard protocol following bolus administration of intravenous contrast. CONTRAST:  142mL OMNIPAQUE IOHEXOL 300 MG/ML  SOLN COMPARISON:  CT chest, abdomen and pelvis 05/28/2020. FINDINGS: Lower chest: Redemonstration of the  scattered sub 5 mm pulmonary nodules in both lower lobes, right middle lobe and lingula compatible with suspected metastatic disease. Lung bases are otherwise clear. Normal heart size. No pericardial effusion. Hepatobiliary: Redemonstration of multiple hepatic metastases demonstrated by some rim enhancement and central hypoattenuation, largest in the inferior right lobe liver measuring up to 3 cm which is not significantly changed in size from the comparison exam when measured at a similar level. Largest lesion in the left lobe liver measures up to 1.7  cm, also unchanged from prior. Few subcentimeter hypoattenuating foci present in the superior right lobe (2/11, 12) are better visualized than on comparison. Normal hepatic attenuation. Smooth liver surface contour. No visible calcified gallstones. Mild gallbladder wall thickening, nonspecific given some adjacent ascites. No biliary ductal dilatation. Pancreas: Redemonstration of the heterogeneous, hypoattenuating 3.9 by 5.1 cm mass involving the pancreatic tail/body (2/32). Mass appears to encase the proximal celiac axis, splenic artery and vein as well as portion of the body of the left adrenal gland, overall similar in appearance to comparison study. Spleen: Normal in size. No concerning splenic lesions. Adrenals/Urinary Tract: Involvement of the left adrenal gland by the pancreatic mass, as detailed above. No concerning right adrenal nodules. Asymmetric scarring of the interpolar right kidney, similar to comparison. Few parapelvic cysts are noted. Nonobstructing calculus present in the lower pole right kidney. No concerning renal masses. No urolithiasis or hydronephrosis. Urinary bladder is largely decompressed at the time of exam and therefore poorly evaluated by CT imaging. No gross bladder abnormality is evident. Some mild perivesicular hazy stranding is nonspecific however. Stomach/Bowel: Distal esophagus is unremarkable. There is some mild thickening at the gastric antrum with mucosal hyperemia, nonspecific and possibly related to normal peristalsis though could correlate for features of gastritis. No small bowel thickening or dilatation. Small air-filled retrocecal appendix. No colonic dilatation or wall thickening. Scattered colonic diverticula without focal inflammation to suggest diverticulitis. No clear mechanical obstruction. Vascular/Lymphatic: Atherosclerotic calcifications within the abdominal aorta and branch vessels. No aneurysm or ectasia. No discernible enlarged abdominopelvic lymph nodes.  Reproductive: Post hysterectomy. Nodular changes along the superior vaginal cuff are again difficult to visualize though may be present (2/78). No other concerning adnexal lesions. Other: Increasing peritoneal nodularity/omental caking with increasing moderate volume loculated abdominal ascites throughout the abdomen. No free intraperitoneal air. No bowel containing hernias. Mild body wall edema. Musculoskeletal: Multilevel degenerative changes are present in the imaged portions of the spine. No acute osseous abnormality or suspicious osseous lesion. IMPRESSION: 1. Increasing peritoneal nodularity/omental caking with increasing moderate volume possibly loculated malignant ascites throughout the abdomen. 2. Redemonstration of the heterogeneous, hypoattenuating mass involving the pancreatic tail/body with encasement of the proximal celiac axis, splenic artery and vein as well as portion of the body of the left adrenal gland. 3. Multiple rim enhancing, centrally hypoattenuating liver lesions compatible with hepatic metastatic disease. 4. Redemonstration of the scattered sub 5 mm pulmonary nodules in the lung bases, compatible with suspected metastatic disease. 5. Mild thickening at the gastric antrum with mucosal hyperemia, nonspecific and possibly related to normal peristalsis though could correlate for features of gastritis. 6. Nonobstructing right nephrolithiasis and right renal cortical scarring. 7. Mild perivesicular hazy stranding is nonspecific however recommend correlation with urinalysis to exclude infection. 8. Aortic Atherosclerosis (ICD10-I70.0). Electronically Signed   By: Lovena Le M.D.   On: 06/10/2020 20:54   CT CHEST ABDOMEN PELVIS W CONTRAST  Addendum Date: 05/28/2020   ADDENDUM REPORT: 05/28/2020 13:36 ADDENDUM: Critical Value/emergent results were called by telephone  at the time of interpretation on 05/28/2020 at 1:30 pm to provider [facility] , who verbally acknowledged these  results. Electronically Signed   By: Julian Hy M.D.   On: 05/28/2020 13:36   Result Date: 05/28/2020 CLINICAL DATA:  Metastatic pancreatic cancer EXAM: CT CHEST, ABDOMEN, AND PELVIS WITH CONTRAST TECHNIQUE: Multidetector CT imaging of the chest, abdomen and pelvis was performed following the standard protocol during bolus administration of intravenous contrast. CONTRAST:  132mL OMNIPAQUE IOHEXOL 300 MG/ML  SOLN COMPARISON:  02/07/2020 FINDINGS: CT CHEST FINDINGS Cardiovascular: The heart is normal in size. No pericardial effusion. No evidence of thoracic aortic aneurysm. Atherosclerotic calcifications of the aortic arch. Although not tailored for evaluation of the pulmonary arteries, incidental segmental/subsegmental right lower lobe pulmonary emboli are suspected (series 3/images 91 and 100). Overall clot burden is small. No findings to suggest right heart strain (RV to LV ratio 0.87). Right chest port terminates at the cavoatrial junction. Mediastinum/Nodes: No suspicious mediastinal lymphadenopathy. 9 mm short axis left perihilar node (series 3/image 77), grossly unchanged. Visualized thyroid is unremarkable. Lungs/Pleura: Numerous scattered small pulmonary nodules throughout all lobes, measuring up to 6 mm in the left lower lobe (series 10/images 74, 76, and 101), previously 4-5 mm. This appearance remains compatible with metastatic disease. Mild centrilobular emphysematous changes, upper lung predominant. No focal consolidation. No pleural effusion or pneumothorax. Musculoskeletal: Degenerative changes of the thoracic spine. CT ABDOMEN PELVIS FINDINGS Hepatobiliary: Multiple hepatic metastases, progressive, now measuring up to 2.3 cm inferiorly in the right hepatic lobe (series 3/image 168). Previously, image 6 single live mm lesion. Gallbladder is unremarkable. No intrahepatic or extrahepatic ductal dilatation. Pancreas: 3.9 x 5.1 cm mass involving the pancreatic body/proximal tail (series 3/image  169), previously 3.7 x 5.5 cm, grossly unchanged. Mass encases the celiac trunk and directly invades the left adrenal gland. Spleen: Within normal limits. Adrenals/Urinary Tract: Left renal invasion, as above. Right adrenal gland is within normal limits. Left kidney is within normal limits. Stable right renal scarring with 7 mm nonobstructing right lower pole renal calculus (series 3/image 213) and fullness of the right renal collecting system. Bladder is underdistended and poorly evaluated. Stomach/Bowel: Stomach is within normal limits. No evidence of bowel obstruction. No colonic wall thickening or mass is evident on CT. Vascular/Lymphatic: No evidence of abdominal aortic aneurysm. Atherosclerotic calcifications of the abdominal aorta and branch vessels. No suspicious abdominopelvic lymphadenopathy. Reproductive: Status post hysterectomy. Pelvic nodularity at the vaginal apex is poorly visualized on the current CT. No adnexal masses. Other: Small to moderate abdominopelvic ascites, new. Associated mild peritoneal disease/omental caking. Musculoskeletal: Degenerative changes of the lumbar spine. IMPRESSION: Although not tailored for evaluation of the pulmonary arteries, there are incidental segmental/subsegmental right lower lobe pulmonary emboli. Overall clot burden is small. No findings to suggest right heart strain. 5.1 cm pancreatic mass, corresponding to the patient's known pancreatic adenocarcinoma, grossly unchanged. Associated vascular encasement. Direct invasion of the left adrenal gland. Progressive hepatic metastases. Small to moderate abdominopelvic ascites with mild peritoneal disease/omental caking, new. Multifocal pulmonary metastases measuring up to 6 mm, mildly progressive. 9 mm short axis left perihilar node, grossly unchanged. Electronically Signed: By: Julian Hy M.D. On: 05/28/2020 13:26   US Paracentesis  Result Date: 06/25/2020 INDICATION: Pancreatic cancer, malignant ascites  EXAM: ULTRASOUND GUIDED THERAPEUTIC PARACENTESIS MEDICATIONS: NONE COMPLICATIONS: NONE IMMEDIATE PROCEDURE: Informed written consent was obtained from the patient after a discussion of the risks, benefits and alternatives to treatment. A timeout was performed prior to the initiation of the procedure.  Initial ultrasound scanning demonstrates a large amount of ascites within the LEFT lower abdominal quadrant. The right lower abdomen was prepped and draped in the usual sterile fashion. 1% lidocaine was used for local anesthesia. Following this, a 5 [place] Yueh catheter was introduced. An ultrasound image was saved for documentation purposes. The paracentesis was performed. The catheter was removed and a dressing was applied. The patient tolerated the procedure well without immediate post procedural complication. Patient received post-procedure intravenous albumin; see nursing notes for details. FINDINGS: A total of approximately 3.0 L of cloudy amber colored ascitic fluid was removed. Samples were sent to the laboratory as requested by the clinical team. IMPRESSION: Successful ultrasound-guided paracentesis yielding 3.0 liters of peritoneal fluid. Electronically Signed   By: Lavonia Dana M.D.   On: 06/25/2020 16:20   US Paracentesis  Result Date: 06/23/2020 INDICATION: Patient with a history of a metastatic pancreatic cancer and recurrent ascites. Interventional radiology asked to perform a therapeutic and diagnostic paracentesis. EXAM: ULTRASOUND GUIDED scratch PARACENTESIS MEDICATIONS: 1% lidocaine 10 mL COMPLICATIONS: None immediate. PROCEDURE: Informed written consent was obtained from the patient after a discussion of the risks, benefits and alternatives to treatment. A timeout was performed prior to the initiation of the procedure. Initial ultrasound scanning demonstrates a large amount of ascites within the left lower abdominal quadrant. The left lower abdomen was prepped and draped in the usual sterile  fashion. 1% lidocaine was used for local anesthesia. Following this, a 19 gauge, 10-cm, Yueh catheter was introduced. An ultrasound image was saved for documentation purposes. The paracentesis was performed. The catheter was removed and a dressing was applied. The patient tolerated the procedure well without immediate post procedural complication. FINDINGS: A total of approximately 3.8 L of clear yellow fluid was removed. Samples were sent to the laboratory as requested by the clinical team. IMPRESSION: Successful ultrasound-guided paracentesis yielding 3.8 liters of peritoneal fluid. Read by: Soyla Dryer, NP Electronically Signed   By: Jerilynn Mages.  Shick M.D.   On: 06/23/2020 12:06   US Paracentesis  Result Date: 06/14/2020 INDICATION: Malignant ascites EXAM: ULTRASOUND GUIDED LLQ PARACENTESIS MEDICATIONS: 10 cc 1% lidocaine COMPLICATIONS: None immediate. PROCEDURE: Informed written consent was obtained from the patient after a discussion of the risks, benefits and alternatives to treatment. A timeout was performed prior to the initiation of the procedure. Initial ultrasound scanning demonstrates a large amount of ascites within the left lower abdominal quadrant. The left lower abdomen was prepped and draped in the usual sterile fashion. 1% lidocaine was used for local anesthesia. Following this, a 2 G Yueh catheter was introduced. An ultrasound image was saved for documentation purposes. The paracentesis was performed. The catheter was removed and a dressing was applied. The patient tolerated the procedure well without immediate post procedural complication. Patient received post-procedure intravenous albumin; see nursing notes for details. FINDINGS: A total of approximately 3 liters of dark yellow fluid was removed. IMPRESSION: Successful ultrasound-guided paracentesis yielding 3 liters of peritoneal fluid. Read by Lavonia Drafts [facility] Electronically Signed   By: Lavonia Dana M.D.   On: 06/14/2020 11:09   US  Paracentesis  Result Date: 06/11/2020 INDICATION: Ascites EXAM: ULTRASOUND GUIDED left PARACENTESIS MEDICATIONS: None. COMPLICATIONS: None immediate. PROCEDURE: Informed written consent was obtained from the patient after a discussion of the risks (including hemorrhage, infection, and damage to adjacent structures, among others), benefits and alternatives to treatment. A timeout was performed prior to the initiation of the procedure. Initial ultrasound scanning demonstrates a small amount of ascites within  the left lower abdominal quadrant. The left lower abdomen was prepped and draped in the usual sterile fashion. 1% lidocaine was used for local anesthesia. Following this, a Yueh catheter was introduced. An ultrasound image was saved for documentation purposes. The paracentesis was performed but only 20 cc of serosanguineous fluid could be aspirated. The catheter was removed and a dressing was applied. The patient tolerated the procedure well without immediate post procedural complication. FINDINGS: A total of approximately 20 cc of serosanguineous fluid was removed. Samples were sent to the laboratory as requested by the clinical team. IMPRESSION: Ultrasound-guided paracentesis yielding 20 cc of peritoneal fluid. Electronically Signed   By: Van Clines M.D.   On: 06/11/2020 12:08   DG Chest Port 1 View  Result Date: 06/22/2020 CLINICAL DATA:  Shortness of breath EXAM: PORTABLE CHEST 1 VIEW COMPARISON:  06/10/2020 chest and abdominal radiographs. FINDINGS: Right chest wall Port-A-Cath tip overlies the superior cavoatrial junction. No focal consolidation, pneumothorax or pleural effusion. Cardiomediastinal silhouette is within normal limits. No acute osseous abnormality. IMPRESSION: No acute airspace disease. Electronically Signed   By: Primitivo Gauze M.D.   On: 06/22/2020 14:57   DG Abdomen Acute W/Chest  Result Date: 06/10/2020 CLINICAL DATA:  59 year old with upper abdominal pain, tenderness  and swelling. Current history of pancreatic cancer for which the patient is receiving chemotherapy. Current smoker. EXAM: ACUTE ABDOMEN SERIES (2 VIEW ABDOMEN AND 1 VIEW CHEST) COMPARISON:  CT chest, abdomen and pelvis 05/28/2020 and earlier. FINDINGS: Mild gaseous distension of the rectum in the transverse colon. Moderate colonic stool burden. No evidence of small-bowel distention. No evidence of free air or significant air-fluid levels on the ERECT image. Phleboliths in both sides of the pelvis. No visible opaque urinary tract calculi. Degenerative changes involving the lower lumbar spine. Cardiomediastinal silhouette unremarkable. RIGHT jugular Port-A-Cath tip projects at or near the cavoatrial junction. Mild emphysematous changes throughout both lungs as noted on the prior CT. The lung nodules identified on CT are inconspicuous on the x-ray. No confluent or ground-glass airspace consolidation. No pleural effusions. IMPRESSION: 1. No evidence of bowel obstruction or free intraperitoneal air. 2. Moderate colonic stool burden. 3. Mild COPD/emphysema. No acute cardiopulmonary disease.  S Electronically Signed   By: Evangeline Dakin M.D.   On: 06/10/2020 16:41    Orson Eva, DO  [facility]  If 7PM-7AM, please contact night-coverage [email or web address] Password [email or web address] 06/25/2020, 5:01 PM   LOS: 2 days

## 2020-06-25 NOTE — Consult Note (Signed)
So they will have [facility] Consultation Oncology  Name: Doris [name]      MRN: [id number]    Location: A313/A313-01  Date: 06/25/2020 Time:1:05 PM   REFERRING PHYSICIAN: Dr. 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  She was last seen by me in the office on 06/06/2020.  She had a recent progression of her metastatic cancer from 05/28/2020 showing multiple liver metastasis and peritoneal/omental metastasis.  She was recently hospitalized and had 3 L of fluid drained via paracentesis on 06/14/2020.  She started developing abdominal pain again and came to the ER on 06/22/2020 with abdominal distention.  She had another paracentesis done on 06/23/2020, 3.8 L of clear yellow fluid removed.  She is currently on Dilaudid 2 mg every 2 hours as needed which is helping control her pain.  She is not able to eat much because of lack of appetite.  She also has some lower extremity swelling which is new.  PAST MEDICAL HISTORY:   Past Medical History:  Diagnosis Date  . Diabetes mellitus without complication ([place])   . Family history of prostate cancer   . Family history of stomach cancer   . Hypertension   . Pancreatic cancer ([place])   . Port-A-Cath in place 05/16/2019    ALLERGIES: Allergies  Allergen Reactions  . Hydrocodone Hives  . Tramadol Itching      MEDICATIONS: I have reviewed the patient's current medications.     PAST SURGICAL HISTORY Past Surgical History:  Procedure Laterality Date  . ABDOMINAL HYSTERECTOMY    . BACK SURGERY    . KIDNEY SURGERY     per pt, had leakage  . tubal ligation Left     FAMILY HISTORY: Family History  Problem Relation Age of Onset  . Diabetes Mother   . Hypertension Mother   . Lung cancer Mother 40       d. 60  . Diabetes Father   .  Hypertension Father   . Heart disease Brother   . Stomach cancer Paternal Aunt 49  . Stroke Paternal Grandmother   . Prostate cancer Other        PGMs brother  . Stomach cancer Other        PGMs mother    SOCIAL HISTORY:  reports that she has been smoking cigarettes. She has a 45.00 pack-year smoking history. She has never used smokeless tobacco. She reports previous alcohol use. She reports that she does not use drugs.  PERFORMANCE STATUS: The patient's performance status is 2 - Symptomatic, <50% confined to bed  PHYSICAL EXAM: Most Recent Vital Signs: Blood pressure 115/77, pulse (!) 101, temperature 98.4 F (36.9 C), temperature source Oral, resp. rate 17, height 5\' 10"  (1.778 m), weight 231 lb 7.7 oz (105 kg), SpO2 100 %. BP 115/77 (BP Location: Right Arm)   Pulse (!) 101   Temp 98.4 F (36.9 C) (Oral)   Resp 17   Ht 5\' 10"  (1.778 m)   Wt 231 lb 7.7 oz (105 kg)   SpO2 100%   BMI 33.21 kg/m  General appearance: alert, cooperative and appears stated age Lungs: clear to auscultation bilaterally Heart: regular rate and rhythm Abdomen: Soft, distended with diffuse tenderness. Extremities: 2+ edema bilaterally extending up to knees. Neurologic: Grossly normal  LABORATORY DATA:  Results for orders placed  or performed during the hospital encounter of 06/22/20 (from the past 48 hour(s))  Glucose, capillary     Status: Abnormal   Collection Time: 06/23/20  6:06 PM  Result Value Ref Range   Glucose-Capillary 131 (H) 70 - 99 mg/dL    Comment: Glucose reference range applies only to samples taken after fasting for at least 8 hours.  Glucose, capillary     Status: Abnormal   Collection Time: 06/23/20 11:12 PM  Result Value Ref Range   Glucose-Capillary 119 (H) 70 - 99 mg/dL    Comment: Glucose reference range applies only to samples taken after fasting for at least 8 hours.   Comment 1 Notify RN    Comment 2 Document in Chart   Glucose, capillary     Status: Abnormal    Collection Time: 06/24/20  7:28 AM  Result Value Ref Range   Glucose-Capillary 125 (H) 70 - 99 mg/dL    Comment: Glucose reference range applies only to samples taken after fasting for at least 8 hours.  Vitamin B12     Status: None   Collection Time: 06/24/20  7:57 AM  Result Value Ref Range   Vitamin B-12 805 180 - 914 pg/mL    Comment: (NOTE) This assay is not validated for testing neonatal or myeloproliferative syndrome specimens for Vitamin B12 levels. Performed at [facility], [address] [place], [place] [address]   Folate     Status: None   Collection Time: 06/24/20  7:57 AM  Result Value Ref Range   Folate 14.3 >5.9 ng/mL    Comment: Performed at [facility], [address] [place], [place] [address]  Basic metabolic panel     Status: Abnormal   Collection Time: 06/24/20  7:57 AM  Result Value Ref Range   Sodium 132 (L) 135 - 145 mmol/L   Potassium 4.7 3.5 - 5.1 mmol/L   Chloride 97 (L) 98 - 111 mmol/L   CO2 27 22 - 32 mmol/L   Glucose, Bld 132 (H) 70 - 99 mg/dL    Comment: Glucose reference range applies only to samples taken after fasting for at least 8 hours.   BUN 17 6 - 20 mg/dL   Creatinine, Ser 0.82 0.44 - 1.00 mg/dL   Calcium 8.6 (L) 8.9 - 10.3 mg/dL   GFR calc non Af Amer >60 >60 mL/min   GFR calc Af Amer >60 >60 mL/min   Anion gap 8 5 - 15    Comment: Performed at [facility], [address], [place], [place] [address]  Magnesium     Status: None   Collection Time: 06/24/20  7:57 AM  Result Value Ref Range   Magnesium 2.1 1.7 - 2.4 mg/dL    Comment: Performed at [facility], [address], [place], [place] [address]  CBC with Differential/Platelet     Status: Abnormal   Collection Time: 06/24/20  7:57 AM  Result Value Ref Range   WBC 6.1 4.0 - 10.5 K/uL   RBC 5.27 (H) 3.87 - 5.11 MIL/uL   Hemoglobin 12.1 12.0 - 15.0 g/dL   HCT 38.7 36 - 46 %   MCV 73.4 (L) 80.0 - 100.0 fL   MCH 23.0 (L) 26.0 - 34.0 pg   MCHC 31.3 30.0 - 36.0 g/dL   RDW 16.4  (H) 11.5 - 15.5 %   Platelets 331 150 - 400 K/uL   nRBC 0.0 0.0 - 0.2 %   Neutrophils Relative % 64 %   Neutro  Abs 3.9 1.7 - 7.7 K/uL   Lymphocytes Relative 21 %   Lymphs Abs 1.3 0.7 - 4.0 K/uL   Monocytes Relative 11 %   Monocytes Absolute 0.7 0 - 1 K/uL   Eosinophils Relative 2 %   Eosinophils Absolute 0.1 0 - 0 K/uL   Basophils Relative 1 %   Basophils Absolute 0.0 0 - 0 K/uL   Immature Granulocytes 1 %   Abs Immature Granulocytes 0.03 0.00 - 0.07 K/uL    Comment: Performed at [facility], [address], [place], [place] [address]  Glucose, capillary     Status: Abnormal   Collection Time: 06/24/20 11:34 AM  Result Value Ref Range   Glucose-Capillary 115 (H) 70 - 99 mg/dL    Comment: Glucose reference range applies only to samples taken after fasting for at least 8 hours.  Basic metabolic panel     Status: Abnormal   Collection Time: 06/25/20  6:43 AM  Result Value Ref Range   Sodium 134 (L) 135 - 145 mmol/L   Potassium 4.3 3.5 - 5.1 mmol/L   Chloride 97 (L) 98 - 111 mmol/L   CO2 26 22 - 32 mmol/L   Glucose, Bld 123 (H) 70 - 99 mg/dL    Comment: Glucose reference range applies only to samples taken after fasting for at least 8 hours.   BUN 15 6 - 20 mg/dL   Creatinine, Ser 0.69 0.44 - 1.00 mg/dL   Calcium 8.3 (L) 8.9 - 10.3 mg/dL   GFR calc non Af Amer >60 >60 mL/min   GFR calc Af Amer >60 >60 mL/min   Anion gap 11 5 - 15    Comment: Performed at [facility], [address] [place], [place] [address]  Magnesium     Status: None   Collection Time: 06/25/20  6:43 AM  Result Value Ref Range   Magnesium 2.1 1.7 - 2.4 mg/dL    Comment: Performed at [facility], [address] [place], [place] [address]  Glucose, capillary     Status: Abnormal   Collection Time: 06/25/20  7:59 AM  Result Value Ref Range   Glucose-Capillary 142 (H) 70 - 99 mg/dL    Comment: Glucose reference range applies only to samples taken after fasting for at least 8 hours.   Comment 1 Notify RN     Comment 2 Document in Chart   Glucose, capillary     Status: Abnormal   Collection Time: 06/25/20 11:17 AM  Result Value Ref Range   Glucose-Capillary 148 (H) 70 - 99 mg/dL    Comment: Glucose reference range applies only to samples taken after fasting for at least 8 hours.   Comment 1 Notify RN    Comment 2 Document in Chart       RADIOGRAPHY: No results found.      ASSESSMENT and PLAN:  1.  Recurrent malignant ascites: -This is from metastatic pancreatic cancer. -Last paracentesis with 3.8 L of fluid removed on 06/23/2020. -Would recommend Pleurx catheter by IR so that patient can drain herself.  2.  Metastatic pancreatic cancer: -We discussed at length about her stage IV pancreatic cancer.  She has progressed on 2 different lines of chemotherapy.  Response rates with third line chemotherapy are low. -Hence I have recommended hospice.  She is also agreeable. -She was evaluated by palliative care in house. -She will benefit from Pleurx catheter prior to discharging her to hospice.  3.  Nutrition: -She does not have any appetite.  Her albumin is low secondary to poor nutrition. -She was told to drink Ensure/boost.  4.  Abdominal pain: -This is usually worse when she reaccumulates fluid. -Continue pain control with Dilaudid. -Pleurx catheter likely will help with pain.  All questions were answered. The patient knows to call the clinic with any problems, questions or concerns. We can certainly see the patient much sooner if necessary.    Derek Jack

## 2020-06-25 NOTE — Procedures (Signed)
PreOperative Dx: Malignant ascites Postoperative Dx: Malignant ascites Procedure:   US guided paracentesis Radiologist:  Thornton Papas Anesthesia:  10 ml of1% lidocaine Specimen:  3.0 L of amber colored ascitic fluid EBL:   < 1 ml Complications: None

## 2020-06-25 NOTE — Sedation Documentation (Signed)
PT tolerated left sided US guided paracentesis well and 3 Liters of cloudy yellow peritoneal removed. PT returned to inpatient room at this time and specimens given to inpatient nursing staff in case labs are ordered on later.

## 2020-06-26 LAB — BODY FLUID CULTURE: Culture: NO GROWTH

## 2020-06-26 LAB — GLUCOSE, CAPILLARY: Glucose-Capillary: 99 mg/dL (ref 70–99)

## 2020-06-26 MED ORDER — MORPHINE SULFATE ER 60 MG PO TBCR
60.0000 mg | EXTENDED_RELEASE_TABLET | Freq: Two times a day (BID) | ORAL | 0 refills | Status: DC
Start: 2020-06-26 — End: 2020-06-26

## 2020-06-26 MED ORDER — MORPHINE SULFATE ER 60 MG PO TBCR
60.0000 mg | EXTENDED_RELEASE_TABLET | Freq: Two times a day (BID) | ORAL | 0 refills | Status: AC
Start: 2020-06-26 — End: ?

## 2020-06-26 MED ORDER — BISACODYL 10 MG RE SUPP
10.0000 mg | Freq: Once | RECTAL | Status: AC
  Administered 2020-06-26: 10 mg via RECTAL
  Filled 2020-06-26: qty 1

## 2020-06-26 MED ORDER — HYDROMORPHONE HCL 4 MG PO TABS
4.0000 mg | ORAL_TABLET | ORAL | 0 refills | Status: AC | PRN
Start: 2020-06-26 — End: ?

## 2020-06-26 NOTE — Care Management Important Message (Signed)
Important Message  Patient Details  Name: Doris [name] MRN: [id number] Date of Birth: March 09, 1961   Medicare Important Message Given:  Yes     Tommy Medal 06/26/2020, 2:00 PM

## 2020-06-26 NOTE — Progress Notes (Signed)
Palliative:  HPI: 59 y.o. female  with past medical history of pancreatic cancer with mets to liver and lung, PE on apixaban, diabetes, hypertension admitted on 06/22/2020 with worsening abdominal pain, bloating, swelling. Also with decreased oral intake and weakness. Uncontrolled pain with home doses of MS Contin and dilaudid. Considering home with hospice.   I met today at Ms. Anes's bedside. She is awaiting transfer to [facility] for peritoneal PleurX drain placement and plans to return home under the care of hospice after procedure. She tells me that her pain is managed well - hospice to continue to manage and adjust at home. Hospice also to help manage PleurX drain. She has not yet had a bowel movement. Will give dulcolax suppository but she would like to wait until after procedure - discussed with RN Tricia. Ms. Yowell has no further questions/concerns at this time. She is anxious to return home.   All questions/concerns addressed. Emotional support provided. Discussed with Dr. Tat and CSW Kathleen.   Exam: Alert, oriented. Fatigued, weak. No distress. Breathing regular, unlabored. Abd distended but soft. BLE edema.   Plan: - Constipation: Senokot 2 tablets daily and may increase as needed. No BM with lactulose given yesterday. Dulcolax suppository x 1 today after PleurX placed. Hospice to follow up.  - Cancer related pain: Continue MS Contin 60 mg po every 12 hours - this will likely need titration by hospice. Continue OxyIR 20 mg po every 6 hours as needed and consider increasing to every 4 hours as needed. Still requiring IV dilaudid so anticipate need for upwards titration of medication but will defer to attending and to hospice at home.   20 min  Alicia Parker, NP Palliative Medicine Team Pager [phone] (Please see [email or web address] for schedule) Team Phone [phone]    Greater than 50%  of this time was spent counseling and coordinating care related to the above assessment and plan  

## 2020-06-26 NOTE — Progress Notes (Signed)
Spoke with [facility]-Doug, [facility] RN-Tricia, Rad PA regarding pt procedure. [facility] will p/u pt tomorrow at 0545 to arrive at [facility] Nurse station at [place]. All parties are in agreeance. Rad PA to change orders to reflect this.

## 2020-06-26 NOTE — Discharge Summary (Signed)
Physician Discharge Summary  Doris [name] UXN:[id number] DOB: 1960-11-01 DOA: 06/22/2020  PCP: Merdis Delay, DO  Admit date: 06/22/2020 Discharge date: 06/27/2020  Admitted From: Home Disposition:  Home with Hospice 1.   Home Health: YES--home with hospice   Discharge Condition: Stable  CODE STATUS: DNR Diet recommendation: Regular   Brief/Interim Summary: 59 year old female with a history of pulmonary embolus on apixaban diabetes mellitus type 2, hypertension, and metastatic pancreatic cancer to the liver and lung presenting with 1 day history of increasing abdominal pain and increasing bloating and swelling. She complains of continued decreased oral intake and generalized weakness. She denies any fevers, chills, headache, chest pain, shortness breath, coughing, hemoptysis, vomiting, diarrhea, dysuria, hematuria, hematochezia, melena. She states that she has been taking her home opioids including MS Contin and Dilaudid without relief. As result, she came to the hospital for further evaluation. Notably, the patient was diagnosed with pancreatic cancer in August 2020. She follows Dr. Delton Coombes at the [facility]. She is currently on salvage therapy. The patient was most recently admitted to the hospital from 06/10/2020 to 06/14/2020 with similar complaints. Her last paracentesis was on 06/14/2020 removing 3 L. There was difficulty with her previous paracentesis due to the loculated nature of her ascites. In the emergency department, the patient was afebrile hemodynamically stable with oxygen saturation 99% room air. BMP showed a sodium 128, but BMP was otherwise unremarkable. LFTs were unremarkable. Lactic acid was 1.2. CBC was unremarkable. The patient was started on IV Dilaudid. Interventional radiology was consulted to assist with possible ultrasound-guided paracentesis.  She had 2 paracenteses during the hospitalization.  A Pleurx catheter was placed on 06/27/20  prior to her discharge for palliative purposes. Palliative medicine was consulted.  After discussion with family, patient wanted to go home with hospice services.   Discharge Diagnoses:  Uncontrolled abdominal pain/malignant ascites -06/23/20 paracentesis--3.8L -06/25/20-paracentesis--3L -Given the loculated nature of the patient's ascites, there has been concern to perform paracentesis without ultrasound guidance -increase IV Dilaudid 2 mg every 2 hours as needed severe pain -d/c home with dilaudid 4 mg po q 4 hours prn pain -IncreaseMS Contin 60 mg every 12 hours -Continue home dose of Percocet -PMP AWARE reviewed--last Percocet prescription#2409/20/2021, MS Contin 06/06/20 #60 -9//29/21 peritoneal catheter inserted by IR for palliative purposes -palliative medicine consulted-->home with hospice after goals of care discussion  Metastatic pancreatic cancer to liver and lung -Patient follows with Dr. Delton Coombes in the [place] -Currently on salvage therapy third line therapy -06/10/2020 CT abdomen/pelvis--increasing abdominal caking and peritoneal nodularity;moderate loculated malignant ascites;heterogeneous and hypoattenuating mass in the pancreatic body and tail with encasement of the proximal celiac axis, splenic artery and vein and left adrenal gland;hepatic and lung mets present -appreciate Dr. Bonne Dolores with palliative approach  Pulmonary embolus history -Diagnosed 05/28/2020 -Continue apixaban  Uncontrolled diabetes mellitus type 2 with hyperglycemia -06/10/2020 hemoglobin A1c 8.8 -NovoLog sliding scale -CBGs controlled -discontinue CBG checks per patient request  Essential hypertension -Holding amlodipine and lisinopril for now and monitor blood pressures -BP has been well controlled  Failure to thrive/severe malnutrition -Continue supplements -Continue Marinol -B12--805 -Folate--14.3 -ContinueIV fluids-->discontinue per patient  request  Hyponatremia -Secondary to volume depletion and poor solute intake -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 -ContinueIV normal saline  GOC -consult palliative-->home with hospice after Tenckhoff/peritoneal catheter is inserted    Discharge Instructions   Allergies as of 06/26/2020      Reactions   Hydrocodone Hives   Tramadol Itching  Medication List    STOP taking these medications   amLODipine 5 MG tablet Commonly known as: NORVASC   diphenoxylate-atropine 2.5-0.025 MG tablet Commonly known as: LOMOTIL   insulin aspart 100 UNIT/ML injection Commonly known as: novoLOG   lisinopril 40 MG tablet Commonly known as: ZESTRIL     TAKE these medications   Apixaban Starter Pack (10mg  and 5mg ) Commonly known as: ELIQUIS STARTER PACK Take as directed on package: start with two-5mg  tablets twice daily for 7 days. On day 8, switch to one-5mg  tablet twice daily. What changed:   how much to take  how to take this  when to take this  additional instructions   dronabinol 2.5 MG capsule Commonly known as: MARINOL Take 1 capsule (2.5 mg total) by mouth 2 (two) times daily before a meal.   GEMZAR IV Inject into the vein See admin instructions. Days 1, 8, 15 q 28 days   HYDROmorphone 4 MG tablet Commonly known as: Dilaudid Take 1 tablet (4 mg total) by mouth every 4 (four) hours as needed for severe pain.   Lactulose 20 GM/30ML Soln Take 30 mLs (20 g total) by mouth at bedtime as needed.   lidocaine-prilocaine cream Commonly known as: EMLA Apply 1 application topically once as needed (for port access).   morphine 60 MG 12 hr tablet Commonly known as: MS CONTIN Take 1 tablet (60 mg total) by mouth every 12 (twelve) hours. What changed:   medication strength  how much to take   ondansetron 4 MG tablet Commonly known as: ZOFRAN TAKE 1 TABLET(4 MG) BY MOUTH THREE TIMES DAILY   oxyCODONE-acetaminophen 10-325 MG tablet Commonly known as: PERCOCET Take  2 tablets by mouth 4 (four) times daily.       Allergies  Allergen Reactions  . Hydrocodone Hives  . Tramadol Itching    Consultations:  palliative   Procedures/Studies: CT ABDOMEN PELVIS W CONTRAST  Result Date: 06/10/2020 CLINICAL DATA:  Abdominal pain, tenderness and swelling, receiving chemotherapy for pancreatic cancer EXAM: CT ABDOMEN AND PELVIS WITH CONTRAST TECHNIQUE: Multidetector CT imaging of the abdomen and pelvis was performed using the standard protocol following bolus administration of intravenous contrast. CONTRAST:  158mL OMNIPAQUE IOHEXOL 300 MG/ML  SOLN COMPARISON:  CT chest, abdomen and pelvis 05/28/2020. FINDINGS: Lower chest: Redemonstration of the scattered sub 5 mm pulmonary nodules in both lower lobes, right middle lobe and lingula compatible with suspected metastatic disease. Lung bases are otherwise clear. Normal heart size. No pericardial effusion. Hepatobiliary: Redemonstration of multiple hepatic metastases demonstrated by some rim enhancement and central hypoattenuation, largest in the inferior right lobe liver measuring up to 3 cm which is not significantly changed in size from the comparison exam when measured at a similar level. Largest lesion in the left lobe liver measures up to 1.7 cm, also unchanged from prior. Few subcentimeter hypoattenuating foci present in the superior right lobe (2/11, 12) are better visualized than on comparison. Normal hepatic attenuation. Smooth liver surface contour. No visible calcified gallstones. Mild gallbladder wall thickening, nonspecific given some adjacent ascites. No biliary ductal dilatation. Pancreas: Redemonstration of the heterogeneous, hypoattenuating 3.9 by 5.1 cm mass involving the pancreatic tail/body (2/32). Mass appears to encase the proximal celiac axis, splenic artery and vein as well as portion of the body of the left adrenal gland, overall similar in appearance to comparison study. Spleen: Normal in size. No  concerning splenic lesions. Adrenals/Urinary Tract: Involvement of the left adrenal gland by the pancreatic mass, as detailed above. No  concerning right adrenal nodules. Asymmetric scarring of the interpolar right kidney, similar to comparison. Few parapelvic cysts are noted. Nonobstructing calculus present in the lower pole right kidney. No concerning renal masses. No urolithiasis or hydronephrosis. Urinary bladder is largely decompressed at the time of exam and therefore poorly evaluated by CT imaging. No gross bladder abnormality is evident. Some mild perivesicular hazy stranding is nonspecific however. Stomach/Bowel: Distal esophagus is unremarkable. There is some mild thickening at the gastric antrum with mucosal hyperemia, nonspecific and possibly related to normal peristalsis though could correlate for features of gastritis. No small bowel thickening or dilatation. Small air-filled retrocecal appendix. No colonic dilatation or wall thickening. Scattered colonic diverticula without focal inflammation to suggest diverticulitis. No clear mechanical obstruction. Vascular/Lymphatic: Atherosclerotic calcifications within the abdominal aorta and branch vessels. No aneurysm or ectasia. No discernible enlarged abdominopelvic lymph nodes. Reproductive: Post hysterectomy. Nodular changes along the superior vaginal cuff are again difficult to visualize though may be present (2/78). No other concerning adnexal lesions. Other: Increasing peritoneal nodularity/omental caking with increasing moderate volume loculated abdominal ascites throughout the abdomen. No free intraperitoneal air. No bowel containing hernias. Mild body wall edema. Musculoskeletal: Multilevel degenerative changes are present in the imaged portions of the spine. No acute osseous abnormality or suspicious osseous lesion. IMPRESSION: 1. Increasing peritoneal nodularity/omental caking with increasing moderate volume possibly loculated malignant ascites  throughout the abdomen. 2. Redemonstration of the heterogeneous, hypoattenuating mass involving the pancreatic tail/body with encasement of the proximal celiac axis, splenic artery and vein as well as portion of the body of the left adrenal gland. 3. Multiple rim enhancing, centrally hypoattenuating liver lesions compatible with hepatic metastatic disease. 4. Redemonstration of the scattered sub 5 mm pulmonary nodules in the lung bases, compatible with suspected metastatic disease. 5. Mild thickening at the gastric antrum with mucosal hyperemia, nonspecific and possibly related to normal peristalsis though could correlate for features of gastritis. 6. Nonobstructing right nephrolithiasis and right renal cortical scarring. 7. Mild perivesicular hazy stranding is nonspecific however recommend correlation with urinalysis to exclude infection. 8. Aortic Atherosclerosis (ICD10-I70.0). Electronically Signed   By: Lovena Le M.D.   On: 06/10/2020 20:54   CT CHEST ABDOMEN PELVIS W CONTRAST  Addendum Date: 05/28/2020   ADDENDUM REPORT: 05/28/2020 13:36 ADDENDUM: Critical Value/emergent results were called by telephone at the time of interpretation on 05/28/2020 at 1:30 pm to provider [facility] , who verbally acknowledged these results. Electronically Signed   By: Julian Hy M.D.   On: 05/28/2020 13:36   Result Date: 05/28/2020 CLINICAL DATA:  Metastatic pancreatic cancer EXAM: CT CHEST, ABDOMEN, AND PELVIS WITH CONTRAST TECHNIQUE: Multidetector CT imaging of the chest, abdomen and pelvis was performed following the standard protocol during bolus administration of intravenous contrast. CONTRAST:  164mL OMNIPAQUE IOHEXOL 300 MG/ML  SOLN COMPARISON:  02/07/2020 FINDINGS: CT CHEST FINDINGS Cardiovascular: The heart is normal in size. No pericardial effusion. No evidence of thoracic aortic aneurysm. Atherosclerotic calcifications of the aortic arch. Although not tailored for evaluation of the pulmonary  arteries, incidental segmental/subsegmental right lower lobe pulmonary emboli are suspected (series 3/images 91 and 100). Overall clot burden is small. No findings to suggest right heart strain (RV to LV ratio 0.87). Right chest port terminates at the cavoatrial junction. Mediastinum/Nodes: No suspicious mediastinal lymphadenopathy. 9 mm short axis left perihilar node (series 3/image 77), grossly unchanged. Visualized thyroid is unremarkable. Lungs/Pleura: Numerous scattered small pulmonary nodules throughout all lobes, measuring up to 6 mm in the left lower lobe (series 10/images  74, 76, and 101), previously 4-5 mm. This appearance remains compatible with metastatic disease. Mild centrilobular emphysematous changes, upper lung predominant. No focal consolidation. No pleural effusion or pneumothorax. Musculoskeletal: Degenerative changes of the thoracic spine. CT ABDOMEN PELVIS FINDINGS Hepatobiliary: Multiple hepatic metastases, progressive, now measuring up to 2.3 cm inferiorly in the right hepatic lobe (series 3/image 168). Previously, image 6 single live mm lesion. Gallbladder is unremarkable. No intrahepatic or extrahepatic ductal dilatation. Pancreas: 3.9 x 5.1 cm mass involving the pancreatic body/proximal tail (series 3/image 169), previously 3.7 x 5.5 cm, grossly unchanged. Mass encases the celiac trunk and directly invades the left adrenal gland. Spleen: Within normal limits. Adrenals/Urinary Tract: Left renal invasion, as above. Right adrenal gland is within normal limits. Left kidney is within normal limits. Stable right renal scarring with 7 mm nonobstructing right lower pole renal calculus (series 3/image 213) and fullness of the right renal collecting system. Bladder is underdistended and poorly evaluated. Stomach/Bowel: Stomach is within normal limits. No evidence of bowel obstruction. No colonic wall thickening or mass is evident on CT. Vascular/Lymphatic: No evidence of abdominal aortic aneurysm.  Atherosclerotic calcifications of the abdominal aorta and branch vessels. No suspicious abdominopelvic lymphadenopathy. Reproductive: Status post hysterectomy. Pelvic nodularity at the vaginal apex is poorly visualized on the current CT. No adnexal masses. Other: Small to moderate abdominopelvic ascites, new. Associated mild peritoneal disease/omental caking. Musculoskeletal: Degenerative changes of the lumbar spine. IMPRESSION: Although not tailored for evaluation of the pulmonary arteries, there are incidental segmental/subsegmental right lower lobe pulmonary emboli. Overall clot burden is small. No findings to suggest right heart strain. 5.1 cm pancreatic mass, corresponding to the patient's known pancreatic adenocarcinoma, grossly unchanged. Associated vascular encasement. Direct invasion of the left adrenal gland. Progressive hepatic metastases. Small to moderate abdominopelvic ascites with mild peritoneal disease/omental caking, new. Multifocal pulmonary metastases measuring up to 6 mm, mildly progressive. 9 mm short axis left perihilar node, grossly unchanged. Electronically Signed: By: Julian Hy M.D. On: 05/28/2020 13:26   US Paracentesis  Result Date: 06/25/2020 INDICATION: Pancreatic cancer, malignant ascites EXAM: ULTRASOUND GUIDED THERAPEUTIC PARACENTESIS MEDICATIONS: NONE COMPLICATIONS: NONE IMMEDIATE PROCEDURE: Informed written consent was obtained from the patient after a discussion of the risks, benefits and alternatives to treatment. A timeout was performed prior to the initiation of the procedure. Initial ultrasound scanning demonstrates a large amount of ascites within the LEFT lower abdominal quadrant. The right lower abdomen was prepped and draped in the usual sterile fashion. 1% lidocaine was used for local anesthesia. Following this, a 5 [place] Yueh catheter was introduced. An ultrasound image was saved for documentation purposes. The paracentesis was performed. The catheter was  removed and a dressing was applied. The patient tolerated the procedure well without immediate post procedural complication. Patient received post-procedure intravenous albumin; see nursing notes for details. FINDINGS: A total of approximately 3.0 L of cloudy amber colored ascitic fluid was removed. Samples were sent to the laboratory as requested by the clinical team. IMPRESSION: Successful ultrasound-guided paracentesis yielding 3.0 liters of peritoneal fluid. Electronically Signed   By: Lavonia Dana M.D.   On: 06/25/2020 16:20   US Paracentesis  Result Date: 06/23/2020 INDICATION: Patient with a history of a metastatic pancreatic cancer and recurrent ascites. Interventional radiology asked to perform a therapeutic and diagnostic paracentesis. EXAM: ULTRASOUND GUIDED scratch PARACENTESIS MEDICATIONS: 1% lidocaine 10 mL COMPLICATIONS: None immediate. PROCEDURE: Informed written consent was obtained from the patient after a discussion of the risks, benefits and alternatives to treatment. A timeout was  performed prior to the initiation of the procedure. Initial ultrasound scanning demonstrates a large amount of ascites within the left lower abdominal quadrant. The left lower abdomen was prepped and draped in the usual sterile fashion. 1% lidocaine was used for local anesthesia. Following this, a 19 gauge, 10-cm, Yueh catheter was introduced. An ultrasound image was saved for documentation purposes. The paracentesis was performed. The catheter was removed and a dressing was applied. The patient tolerated the procedure well without immediate post procedural complication. FINDINGS: A total of approximately 3.8 L of clear yellow fluid was removed. Samples were sent to the laboratory as requested by the clinical team. IMPRESSION: Successful ultrasound-guided paracentesis yielding 3.8 liters of peritoneal fluid. Read by: Soyla Dryer, NP Electronically Signed   By: Jerilynn Mages.  Shick M.D.   On: 06/23/2020 12:06   US  Paracentesis  Result Date: 06/14/2020 INDICATION: Malignant ascites EXAM: ULTRASOUND GUIDED LLQ PARACENTESIS MEDICATIONS: 10 cc 1% lidocaine COMPLICATIONS: None immediate. PROCEDURE: Informed written consent was obtained from the patient after a discussion of the risks, benefits and alternatives to treatment. A timeout was performed prior to the initiation of the procedure. Initial ultrasound scanning demonstrates a large amount of ascites within the left lower abdominal quadrant. The left lower abdomen was prepped and draped in the usual sterile fashion. 1% lidocaine was used for local anesthesia. Following this, a 3 G Yueh catheter was introduced. An ultrasound image was saved for documentation purposes. The paracentesis was performed. The catheter was removed and a dressing was applied. The patient tolerated the procedure well without immediate post procedural complication. Patient received post-procedure intravenous albumin; see nursing notes for details. FINDINGS: A total of approximately 3 liters of dark yellow fluid was removed. IMPRESSION: Successful ultrasound-guided paracentesis yielding 3 liters of peritoneal fluid. Read by Lavonia Drafts [facility] Electronically Signed   By: Lavonia Dana M.D.   On: 06/14/2020 11:09   US Paracentesis  Result Date: 06/11/2020 INDICATION: Ascites EXAM: ULTRASOUND GUIDED left PARACENTESIS MEDICATIONS: None. COMPLICATIONS: None immediate. PROCEDURE: Informed written consent was obtained from the patient after a discussion of the risks (including hemorrhage, infection, and damage to adjacent structures, among others), benefits and alternatives to treatment. A timeout was performed prior to the initiation of the procedure. Initial ultrasound scanning demonstrates a small amount of ascites within the left lower abdominal quadrant. The left lower abdomen was prepped and draped in the usual sterile fashion. 1% lidocaine was used for local anesthesia. Following this, a Yueh  catheter was introduced. An ultrasound image was saved for documentation purposes. The paracentesis was performed but only 20 cc of serosanguineous fluid could be aspirated. The catheter was removed and a dressing was applied. The patient tolerated the procedure well without immediate post procedural complication. FINDINGS: A total of approximately 20 cc of serosanguineous fluid was removed. Samples were sent to the laboratory as requested by the clinical team. IMPRESSION: Ultrasound-guided paracentesis yielding 20 cc of peritoneal fluid. Electronically Signed   By: Van Clines M.D.   On: 06/11/2020 12:08   DG Chest Port 1 View  Result Date: 06/22/2020 CLINICAL DATA:  Shortness of breath EXAM: PORTABLE CHEST 1 VIEW COMPARISON:  06/10/2020 chest and abdominal radiographs. FINDINGS: Right chest wall Port-A-Cath tip overlies the superior cavoatrial junction. No focal consolidation, pneumothorax or pleural effusion. Cardiomediastinal silhouette is within normal limits. No acute osseous abnormality. IMPRESSION: No acute airspace disease. Electronically Signed   By: Primitivo Gauze M.D.   On: 06/22/2020 14:57   DG Abdomen Acute W/Chest  Result Date: 06/10/2020 CLINICAL DATA:  59 year old with upper abdominal pain, tenderness and swelling. Current history of pancreatic cancer for which the patient is receiving chemotherapy. Current smoker. EXAM: ACUTE ABDOMEN SERIES (2 VIEW ABDOMEN AND 1 VIEW CHEST) COMPARISON:  CT chest, abdomen and pelvis 05/28/2020 and earlier. FINDINGS: Mild gaseous distension of the rectum in the transverse colon. Moderate colonic stool burden. No evidence of small-bowel distention. No evidence of free air or significant air-fluid levels on the ERECT image. Phleboliths in both sides of the pelvis. No visible opaque urinary tract calculi. Degenerative changes involving the lower lumbar spine. Cardiomediastinal silhouette unremarkable. RIGHT jugular Port-A-Cath tip projects at or near  the cavoatrial junction. Mild emphysematous changes throughout both lungs as noted on the prior CT. The lung nodules identified on CT are inconspicuous on the x-ray. No confluent or ground-glass airspace consolidation. No pleural effusions. IMPRESSION: 1. No evidence of bowel obstruction or free intraperitoneal air. 2. Moderate colonic stool burden. 3. Mild COPD/emphysema. No acute cardiopulmonary disease.  S Electronically Signed   By: Evangeline Dakin M.D.   On: 06/10/2020 16:41         Discharge Exam: Vitals:   06/25/20 2159 06/26/20 0617  BP: 115/72 127/80  Pulse: (!) 104 (!) 110  Resp: 20 20  Temp: 98 F (36.7 C) 98.2 F (36.8 C)  SpO2: 98% 99%   Vitals:   06/25/20 1440 06/25/20 1510 06/25/20 2159 06/26/20 0617  BP: (!) 143/98 (!) 142/92 115/72 127/80  Pulse: 99 96 (!) 104 (!) 110  Resp: 18 20 20 20   Temp:  98.7 F (37.1 C) 98 F (36.7 C) 98.2 F (36.8 C)  TempSrc:  Oral Oral   SpO2: 98% 98% 98% 99%  Weight:      Height:        General: Pt is alert, awake, not in acute distress Cardiovascular: RRR, S1/S2 +, no rubs, no gallops Respiratory: CTA bilaterally, no wheezing, no rhonchi Abdominal: Soft, NT, ND, bowel sounds + Extremities: no edema, no cyanosis   The results of significant diagnostics from this hospitalization (including imaging, microbiology, ancillary and laboratory) are listed below for reference.    Significant Diagnostic Studies: CT ABDOMEN PELVIS W CONTRAST  Result Date: 06/10/2020 CLINICAL DATA:  Abdominal pain, tenderness and swelling, receiving chemotherapy for pancreatic cancer EXAM: CT ABDOMEN AND PELVIS WITH CONTRAST TECHNIQUE: Multidetector CT imaging of the abdomen and pelvis was performed using the standard protocol following bolus administration of intravenous contrast. CONTRAST:  147mL OMNIPAQUE IOHEXOL 300 MG/ML  SOLN COMPARISON:  CT chest, abdomen and pelvis 05/28/2020. FINDINGS: Lower chest: Redemonstration of the scattered sub 5 mm  pulmonary nodules in both lower lobes, right middle lobe and lingula compatible with suspected metastatic disease. Lung bases are otherwise clear. Normal heart size. No pericardial effusion. Hepatobiliary: Redemonstration of multiple hepatic metastases demonstrated by some rim enhancement and central hypoattenuation, largest in the inferior right lobe liver measuring up to 3 cm which is not significantly changed in size from the comparison exam when measured at a similar level. Largest lesion in the left lobe liver measures up to 1.7 cm, also unchanged from prior. Few subcentimeter hypoattenuating foci present in the superior right lobe (2/11, 12) are better visualized than on comparison. Normal hepatic attenuation. Smooth liver surface contour. No visible calcified gallstones. Mild gallbladder wall thickening, nonspecific given some adjacent ascites. No biliary ductal dilatation. Pancreas: Redemonstration of the heterogeneous, hypoattenuating 3.9 by 5.1 cm mass involving the pancreatic tail/body (2/32). Mass appears to encase the  proximal celiac axis, splenic artery and vein as well as portion of the body of the left adrenal gland, overall similar in appearance to comparison study. Spleen: Normal in size. No concerning splenic lesions. Adrenals/Urinary Tract: Involvement of the left adrenal gland by the pancreatic mass, as detailed above. No concerning right adrenal nodules. Asymmetric scarring of the interpolar right kidney, similar to comparison. Few parapelvic cysts are noted. Nonobstructing calculus present in the lower pole right kidney. No concerning renal masses. No urolithiasis or hydronephrosis. Urinary bladder is largely decompressed at the time of exam and therefore poorly evaluated by CT imaging. No gross bladder abnormality is evident. Some mild perivesicular hazy stranding is nonspecific however. Stomach/Bowel: Distal esophagus is unremarkable. There is some mild thickening at the gastric antrum with  mucosal hyperemia, nonspecific and possibly related to normal peristalsis though could correlate for features of gastritis. No small bowel thickening or dilatation. Small air-filled retrocecal appendix. No colonic dilatation or wall thickening. Scattered colonic diverticula without focal inflammation to suggest diverticulitis. No clear mechanical obstruction. Vascular/Lymphatic: Atherosclerotic calcifications within the abdominal aorta and branch vessels. No aneurysm or ectasia. No discernible enlarged abdominopelvic lymph nodes. Reproductive: Post hysterectomy. Nodular changes along the superior vaginal cuff are again difficult to visualize though may be present (2/78). No other concerning adnexal lesions. Other: Increasing peritoneal nodularity/omental caking with increasing moderate volume loculated abdominal ascites throughout the abdomen. No free intraperitoneal air. No bowel containing hernias. Mild body wall edema. Musculoskeletal: Multilevel degenerative changes are present in the imaged portions of the spine. No acute osseous abnormality or suspicious osseous lesion. IMPRESSION: 1. Increasing peritoneal nodularity/omental caking with increasing moderate volume possibly loculated malignant ascites throughout the abdomen. 2. Redemonstration of the heterogeneous, hypoattenuating mass involving the pancreatic tail/body with encasement of the proximal celiac axis, splenic artery and vein as well as portion of the body of the left adrenal gland. 3. Multiple rim enhancing, centrally hypoattenuating liver lesions compatible with hepatic metastatic disease. 4. Redemonstration of the scattered sub 5 mm pulmonary nodules in the lung bases, compatible with suspected metastatic disease. 5. Mild thickening at the gastric antrum with mucosal hyperemia, nonspecific and possibly related to normal peristalsis though could correlate for features of gastritis. 6. Nonobstructing right nephrolithiasis and right renal cortical  scarring. 7. Mild perivesicular hazy stranding is nonspecific however recommend correlation with urinalysis to exclude infection. 8. Aortic Atherosclerosis (ICD10-I70.0). Electronically Signed   By: Lovena Le M.D.   On: 06/10/2020 20:54   CT CHEST ABDOMEN PELVIS W CONTRAST  Addendum Date: 05/28/2020   ADDENDUM REPORT: 05/28/2020 13:36 ADDENDUM: Critical Value/emergent results were called by telephone at the time of interpretation on 05/28/2020 at 1:30 pm to provider [facility] , who verbally acknowledged these results. Electronically Signed   By: Julian Hy M.D.   On: 05/28/2020 13:36   Result Date: 05/28/2020 CLINICAL DATA:  Metastatic pancreatic cancer EXAM: CT CHEST, ABDOMEN, AND PELVIS WITH CONTRAST TECHNIQUE: Multidetector CT imaging of the chest, abdomen and pelvis was performed following the standard protocol during bolus administration of intravenous contrast. CONTRAST:  1107mL OMNIPAQUE IOHEXOL 300 MG/ML  SOLN COMPARISON:  02/07/2020 FINDINGS: CT CHEST FINDINGS Cardiovascular: The heart is normal in size. No pericardial effusion. No evidence of thoracic aortic aneurysm. Atherosclerotic calcifications of the aortic arch. Although not tailored for evaluation of the pulmonary arteries, incidental segmental/subsegmental right lower lobe pulmonary emboli are suspected (series 3/images 91 and 100). Overall clot burden is small. No findings to suggest right heart strain (RV to LV ratio 0.87).  Right chest port terminates at the cavoatrial junction. Mediastinum/Nodes: No suspicious mediastinal lymphadenopathy. 9 mm short axis left perihilar node (series 3/image 77), grossly unchanged. Visualized thyroid is unremarkable. Lungs/Pleura: Numerous scattered small pulmonary nodules throughout all lobes, measuring up to 6 mm in the left lower lobe (series 10/images 74, 76, and 101), previously 4-5 mm. This appearance remains compatible with metastatic disease. Mild centrilobular emphysematous  changes, upper lung predominant. No focal consolidation. No pleural effusion or pneumothorax. Musculoskeletal: Degenerative changes of the thoracic spine. CT ABDOMEN PELVIS FINDINGS Hepatobiliary: Multiple hepatic metastases, progressive, now measuring up to 2.3 cm inferiorly in the right hepatic lobe (series 3/image 168). Previously, image 6 single live mm lesion. Gallbladder is unremarkable. No intrahepatic or extrahepatic ductal dilatation. Pancreas: 3.9 x 5.1 cm mass involving the pancreatic body/proximal tail (series 3/image 169), previously 3.7 x 5.5 cm, grossly unchanged. Mass encases the celiac trunk and directly invades the left adrenal gland. Spleen: Within normal limits. Adrenals/Urinary Tract: Left renal invasion, as above. Right adrenal gland is within normal limits. Left kidney is within normal limits. Stable right renal scarring with 7 mm nonobstructing right lower pole renal calculus (series 3/image 213) and fullness of the right renal collecting system. Bladder is underdistended and poorly evaluated. Stomach/Bowel: Stomach is within normal limits. No evidence of bowel obstruction. No colonic wall thickening or mass is evident on CT. Vascular/Lymphatic: No evidence of abdominal aortic aneurysm. Atherosclerotic calcifications of the abdominal aorta and branch vessels. No suspicious abdominopelvic lymphadenopathy. Reproductive: Status post hysterectomy. Pelvic nodularity at the vaginal apex is poorly visualized on the current CT. No adnexal masses. Other: Small to moderate abdominopelvic ascites, new. Associated mild peritoneal disease/omental caking. Musculoskeletal: Degenerative changes of the lumbar spine. IMPRESSION: Although not tailored for evaluation of the pulmonary arteries, there are incidental segmental/subsegmental right lower lobe pulmonary emboli. Overall clot burden is small. No findings to suggest right heart strain. 5.1 cm pancreatic mass, corresponding to the patient's known  pancreatic adenocarcinoma, grossly unchanged. Associated vascular encasement. Direct invasion of the left adrenal gland. Progressive hepatic metastases. Small to moderate abdominopelvic ascites with mild peritoneal disease/omental caking, new. Multifocal pulmonary metastases measuring up to 6 mm, mildly progressive. 9 mm short axis left perihilar node, grossly unchanged. Electronically Signed: By: Julian Hy M.D. On: 05/28/2020 13:26   US Paracentesis  Result Date: 06/25/2020 INDICATION: Pancreatic cancer, malignant ascites EXAM: ULTRASOUND GUIDED THERAPEUTIC PARACENTESIS MEDICATIONS: NONE COMPLICATIONS: NONE IMMEDIATE PROCEDURE: Informed written consent was obtained from the patient after a discussion of the risks, benefits and alternatives to treatment. A timeout was performed prior to the initiation of the procedure. Initial ultrasound scanning demonstrates a large amount of ascites within the LEFT lower abdominal quadrant. The right lower abdomen was prepped and draped in the usual sterile fashion. 1% lidocaine was used for local anesthesia. Following this, a 5 [place] Yueh catheter was introduced. An ultrasound image was saved for documentation purposes. The paracentesis was performed. The catheter was removed and a dressing was applied. The patient tolerated the procedure well without immediate post procedural complication. Patient received post-procedure intravenous albumin; see nursing notes for details. FINDINGS: A total of approximately 3.0 L of cloudy amber colored ascitic fluid was removed. Samples were sent to the laboratory as requested by the clinical team. IMPRESSION: Successful ultrasound-guided paracentesis yielding 3.0 liters of peritoneal fluid. Electronically Signed   By: Lavonia Dana M.D.   On: 06/25/2020 16:20   US Paracentesis  Result Date: 06/23/2020 INDICATION: Patient with a history of a metastatic pancreatic  cancer and recurrent ascites. Interventional radiology asked to  perform a therapeutic and diagnostic paracentesis. EXAM: ULTRASOUND GUIDED scratch PARACENTESIS MEDICATIONS: 1% lidocaine 10 mL COMPLICATIONS: None immediate. PROCEDURE: Informed written consent was obtained from the patient after a discussion of the risks, benefits and alternatives to treatment. A timeout was performed prior to the initiation of the procedure. Initial ultrasound scanning demonstrates a large amount of ascites within the left lower abdominal quadrant. The left lower abdomen was prepped and draped in the usual sterile fashion. 1% lidocaine was used for local anesthesia. Following this, a 19 gauge, 10-cm, Yueh catheter was introduced. An ultrasound image was saved for documentation purposes. The paracentesis was performed. The catheter was removed and a dressing was applied. The patient tolerated the procedure well without immediate post procedural complication. FINDINGS: A total of approximately 3.8 L of clear yellow fluid was removed. Samples were sent to the laboratory as requested by the clinical team. IMPRESSION: Successful ultrasound-guided paracentesis yielding 3.8 liters of peritoneal fluid. Read by: Soyla Dryer, NP Electronically Signed   By: Jerilynn Mages.  Shick M.D.   On: 06/23/2020 12:06   US Paracentesis  Result Date: 06/14/2020 INDICATION: Malignant ascites EXAM: ULTRASOUND GUIDED LLQ PARACENTESIS MEDICATIONS: 10 cc 1% lidocaine COMPLICATIONS: None immediate. PROCEDURE: Informed written consent was obtained from the patient after a discussion of the risks, benefits and alternatives to treatment. A timeout was performed prior to the initiation of the procedure. Initial ultrasound scanning demonstrates a large amount of ascites within the left lower abdominal quadrant. The left lower abdomen was prepped and draped in the usual sterile fashion. 1% lidocaine was used for local anesthesia. Following this, a 41 G Yueh catheter was introduced. An ultrasound image was saved for documentation  purposes. The paracentesis was performed. The catheter was removed and a dressing was applied. The patient tolerated the procedure well without immediate post procedural complication. Patient received post-procedure intravenous albumin; see nursing notes for details. FINDINGS: A total of approximately 3 liters of dark yellow fluid was removed. IMPRESSION: Successful ultrasound-guided paracentesis yielding 3 liters of peritoneal fluid. Read by Lavonia Drafts [facility] Electronically Signed   By: Lavonia Dana M.D.   On: 06/14/2020 11:09   US Paracentesis  Result Date: 06/11/2020 INDICATION: Ascites EXAM: ULTRASOUND GUIDED left PARACENTESIS MEDICATIONS: None. COMPLICATIONS: None immediate. PROCEDURE: Informed written consent was obtained from the patient after a discussion of the risks (including hemorrhage, infection, and damage to adjacent structures, among others), benefits and alternatives to treatment. A timeout was performed prior to the initiation of the procedure. Initial ultrasound scanning demonstrates a small amount of ascites within the left lower abdominal quadrant. The left lower abdomen was prepped and draped in the usual sterile fashion. 1% lidocaine was used for local anesthesia. Following this, a Yueh catheter was introduced. An ultrasound image was saved for documentation purposes. The paracentesis was performed but only 20 cc of serosanguineous fluid could be aspirated. The catheter was removed and a dressing was applied. The patient tolerated the procedure well without immediate post procedural complication. FINDINGS: A total of approximately 20 cc of serosanguineous fluid was removed. Samples were sent to the laboratory as requested by the clinical team. IMPRESSION: Ultrasound-guided paracentesis yielding 20 cc of peritoneal fluid. Electronically Signed   By: Van Clines M.D.   On: 06/11/2020 12:08   DG Chest Port 1 View  Result Date: 06/22/2020 CLINICAL DATA:  Shortness of breath EXAM:  PORTABLE CHEST 1 VIEW COMPARISON:  06/10/2020 chest and abdominal radiographs. FINDINGS: Right chest  wall Port-A-Cath tip overlies the superior cavoatrial junction. No focal consolidation, pneumothorax or pleural effusion. Cardiomediastinal silhouette is within normal limits. No acute osseous abnormality. IMPRESSION: No acute airspace disease. Electronically Signed   By: Primitivo Gauze M.D.   On: 06/22/2020 14:57   DG Abdomen Acute W/Chest  Result Date: 06/10/2020 CLINICAL DATA:  59 year old with upper abdominal pain, tenderness and swelling. Current history of pancreatic cancer for which the patient is receiving chemotherapy. Current smoker. EXAM: ACUTE ABDOMEN SERIES (2 VIEW ABDOMEN AND 1 VIEW CHEST) COMPARISON:  CT chest, abdomen and pelvis 05/28/2020 and earlier. FINDINGS: Mild gaseous distension of the rectum in the transverse colon. Moderate colonic stool burden. No evidence of small-bowel distention. No evidence of free air or significant air-fluid levels on the ERECT image. Phleboliths in both sides of the pelvis. No visible opaque urinary tract calculi. Degenerative changes involving the lower lumbar spine. Cardiomediastinal silhouette unremarkable. RIGHT jugular Port-A-Cath tip projects at or near the cavoatrial junction. Mild emphysematous changes throughout both lungs as noted on the prior CT. The lung nodules identified on CT are inconspicuous on the x-ray. No confluent or ground-glass airspace consolidation. No pleural effusions. IMPRESSION: 1. No evidence of bowel obstruction or free intraperitoneal air. 2. Moderate colonic stool burden. 3. Mild COPD/emphysema. No acute cardiopulmonary disease.  S Electronically Signed   By: Evangeline Dakin M.D.   On: 06/10/2020 16:41     Microbiology: Recent Results (from the past 240 hour(s))  Respiratory Panel by RT PCR (Flu A&B, Covid) - Nasopharyngeal Swab     Status: None   Collection Time: 06/22/20  6:00 PM   Specimen: Nasopharyngeal Swab   Result Value Ref Range Status   SARS Coronavirus 2 by RT PCR NEGATIVE NEGATIVE Final    Comment: (NOTE) SARS-CoV-2 target nucleic acids are NOT DETECTED.  The SARS-CoV-2 RNA is generally detectable in upper respiratoy specimens during the acute phase of infection. The lowest concentration of SARS-CoV-2 viral copies this assay can detect is 131 copies/mL. A negative result does not preclude SARS-Cov-2 infection and should not be used as the sole basis for treatment or other patient management decisions. A negative result may occur with  improper specimen collection/handling, submission of specimen other than nasopharyngeal swab, presence of viral mutation(s) within the areas targeted by this assay, and inadequate number of viral copies (<131 copies/mL). A negative result must be combined with clinical observations, patient history, and epidemiological information. The expected result is Negative.  Fact Sheet for Patients:  PinkCheek.be  Fact Sheet for Healthcare Providers:  GravelBags.it  This test is no t yet approved or cleared by the [place] FDA and  has been authorized for detection and/or diagnosis of SARS-CoV-2 by FDA under an Emergency Use Authorization (EUA). This EUA will remain  in effect (meaning this test can be used) for the duration of the COVID-19 declaration under Section 564(b)(1) of the Act, 21 U.S.C. section 360bbb-3(b)(1), unless the authorization is terminated or revoked sooner.     Influenza A by PCR NEGATIVE NEGATIVE Final   Influenza B by PCR NEGATIVE NEGATIVE Final    Comment: (NOTE) The Xpert Xpress SARS-CoV-2/FLU/RSV assay is intended as an aid in  the diagnosis of influenza from Nasopharyngeal swab specimens and  should not be used as a sole basis for treatment. Nasal washings and  aspirates are unacceptable for Xpert Xpress SARS-CoV-2/FLU/RSV  testing.  Fact Sheet for  Patients: PinkCheek.be  Fact Sheet for Healthcare Providers: GravelBags.it  This test is not yet approved or  cleared by the [place] and  has been authorized for detection and/or diagnosis of SARS-CoV-2 by  FDA under an Emergency Use Authorization (EUA). This EUA will remain  in effect (meaning this test can be used) for the duration of the  Covid-19 declaration under Section 564(b)(1) of the Act, 21  U.S.C. section 360bbb-3(b)(1), unless the authorization is  terminated or revoked. Performed at [facility], [address], [place], [place] [address]   Body fluid culture     Status: None   Collection Time: 06/23/20 11:55 AM   Specimen: Abdomen; Peritoneal Fluid  Result Value Ref Range Status   Specimen Description PERITONEAL FLUID  Final   Special Requests ABDOMEN  Final   Gram Stain   Final    RARE WBC PRESENT, PREDOMINANTLY MONONUCLEAR NO ORGANISMS SEEN    Culture   Final    NO GROWTH 3 DAYS Performed at [facility], [address] [place], [place] [address]    Report Status 06/26/2020 FINAL  Final     Labs: Basic Metabolic Panel: Recent Labs  Lab 06/22/20 1455 06/22/20 1455 06/23/20 0706 06/23/20 0706 06/24/20 0757 06/25/20 0643  NA 128*  --  129*  --  132* 134*  K 4.4   < > 4.5   < > 4.7 4.3  CL 91*  --  91*  --  97* 97*  CO2 26  --  25  --  27 26  GLUCOSE 174*  --  148*  --  132* 123*  BUN 22*  --  22*  --  17 15  CREATININE 0.97  --  1.04*  --  0.82 0.69  CALCIUM 8.6*  --  8.9  --  8.6* 8.3*  MG  --   --   --   --  2.1 2.1   < > = values in this interval not displayed.   Liver Function Tests: Recent Labs  Lab 06/22/20 1455  AST 18  ALT 16  ALKPHOS 91  BILITOT 0.7  PROT 6.1*  ALBUMIN 2.9*   Recent Labs  Lab 06/22/20 1455  LIPASE 19   No results for input(s): AMMONIA in the last 168 hours. CBC: Recent Labs  Lab 06/22/20 1455 06/23/20 0706 06/24/20 0757  WBC 6.2 6.1  6.1  NEUTROABS 3.5  --  3.9  HGB 12.8 12.6 12.1  HCT 39.5 39.2 38.7  MCV 73.7* 73.4* 73.4*  PLT 376 382 331   Cardiac Enzymes: No results for input(s): CKTOTAL, CKMB, CKMBINDEX, TROPONINI in the last 168 hours. BNP: Invalid input(s): POCBNP CBG: Recent Labs  Lab 06/23/20 2312 06/24/20 0728 06/24/20 1134 06/25/20 0759 06/25/20 1117  GLUCAP 119* 125* 115* 142* 148*    Time coordinating discharge:  36 minutes  Signed:  Orson Eva, DO [facility] Pager: [phone] 06/26/2020, 9:59 AM

## 2020-06-26 NOTE — Progress Notes (Signed)
   Peritoneal PleurX cath placement on for today in IR Pt to be at [facility] via ambulance asap  RN aware

## 2020-06-26 NOTE — Plan of Care (Signed)

## 2020-06-26 NOTE — Progress Notes (Signed)
   IR aware of PleurX cath placement Pt on Eliquis  In review  We will call RN with update asap today

## 2020-06-27 ENCOUNTER — Ambulatory Visit (HOSPITAL_COMMUNITY): Payer: Medicare HMO

## 2020-06-27 ENCOUNTER — Encounter (HOSPITAL_COMMUNITY): Payer: Medicare HMO

## 2020-06-27 DIAGNOSIS — C787 Secondary malignant neoplasm of liver and intrahepatic bile duct: Secondary | ICD-10-CM | POA: Insufficient documentation

## 2020-06-27 DIAGNOSIS — R180 Malignant ascites: Secondary | ICD-10-CM | POA: Insufficient documentation

## 2020-06-27 DIAGNOSIS — C259 Malignant neoplasm of pancreas, unspecified: Secondary | ICD-10-CM | POA: Insufficient documentation

## 2020-06-27 HISTORY — PX: IR PERC TUN PERIT CATH WO PORT S&I /IMAG: IMG2327

## 2020-06-27 MED ORDER — MIDAZOLAM HCL 2 MG/2ML IJ SOLN
INTRAMUSCULAR | Status: AC
  Filled 2020-06-27: qty 2

## 2020-06-27 MED ORDER — CEFAZOLIN SODIUM-DEXTROSE 2-4 GM/100ML-% IV SOLN
INTRAVENOUS | Status: AC
  Administered 2020-06-27: 2000 mg
  Filled 2020-06-27: qty 100

## 2020-06-27 MED ORDER — LIDOCAINE-EPINEPHRINE 1 %-1:100000 IJ SOLN
INTRAMUSCULAR | Status: AC
  Filled 2020-06-27: qty 1

## 2020-06-27 MED ORDER — MIDAZOLAM HCL 2 MG/2ML IJ SOLN
INTRAMUSCULAR | Status: AC | PRN
  Administered 2020-06-27: 1 mg via INTRAVENOUS

## 2020-06-27 MED ORDER — FENTANYL CITRATE (PF) 100 MCG/2ML IJ SOLN
INTRAMUSCULAR | Status: AC | PRN
  Administered 2020-06-27: 50 ug via INTRAVENOUS

## 2020-06-27 MED ORDER — FENTANYL CITRATE (PF) 100 MCG/2ML IJ SOLN
INTRAMUSCULAR | Status: AC
  Filled 2020-06-27: qty 2

## 2020-06-27 MED ORDER — HEPARIN SOD (PORK) LOCK FLUSH 100 UNIT/ML IV SOLN
500.0000 [IU] | Freq: Once | INTRAVENOUS | Status: AC
  Administered 2020-06-27: 500 [IU] via INTRAVENOUS
  Filled 2020-06-27: qty 5

## 2020-06-27 MED ORDER — LIDOCAINE-EPINEPHRINE 2 %-1:100000 IJ SOLN
INTRAMUSCULAR | Status: AC | PRN
  Administered 2020-06-27: 20 mL via INTRADERMAL

## 2020-06-27 NOTE — Consult Note (Signed)
Chief Complaint: Patient was seen in consultation today for abdominal pleurx placement.  Referring Physician(s): Tat, Shanon Brow  Supervising Physician: Ruthann Cancer   Patient Status: [facility] inpatient - transferred to [facility] for procedure  History of Present Illness: Doris [name] is a 59 y.o. female with a past medical history significant for HTN, DM, PE and pancreatic cancer with recurrent malignant ascites who presents today for an abdominal pleurx placement. Doris [name] was first diagnosed with metastatic pancreatic cancer in August of 2020 and has been followed by oncology since that time. She has had recurrent ascites requiring frequent paracentesis (9/13, 9/16, 9/25, 9/27) - most recent yielding 3.0 L of peritoneal fluid. She has decided to go home with hospice care and has elected to have an abdominal pleurx placed for symptom management. She has been transferred to [facility] from [facility] for this procedure today in IR.  Doris [name] reports abdominal pain and "pain from being agitated" but is otherwise doing ok. She is looking forward to the procedure being over so she can go home. She will have a hospice nurse at home who will help her with the pleurx. She understands the procedure today and is agreeable to proceed as planned.   Past Medical History:  Diagnosis Date  . Diabetes mellitus without complication ([place])   . Family history of prostate cancer   . Family history of stomach cancer   . Hypertension   . Pancreatic cancer ([place])   . Port-A-Cath in place 05/16/2019    Past Surgical History:  Procedure Laterality Date  . ABDOMINAL HYSTERECTOMY    . BACK SURGERY    . KIDNEY SURGERY     per pt, had leakage  . tubal ligation Left     Allergies: Hydrocodone and Tramadol  Medications: Prior to Admission medications   Medication Sig Start Date End Date Taking? Authorizing Provider  amLODipine (NORVASC) 5 MG tablet Take 1 tablet (5 mg total) by mouth 2 (two) times daily. 06/01/19  Yes  Derek Jack, MD  APIXABAN Arne [place]) VTE STARTER PACK (10MG  AND 5MG ) Take as directed on package: start with two-5mg  tablets twice daily for 7 days. On day 8, switch to one-5mg  tablet twice daily. Patient taking differently: Take 5 mg by mouth daily.  05/28/20  Yes Derek Jack, MD  diphenoxylate-atropine (LOMOTIL) 2.5-0.025 MG tablet Take 1 tablet by mouth 4 (four) times daily as needed for diarrhea or loose stools. 02/15/20  Yes Derek Jack, MD  dronabinol (MARINOL) 2.5 MG capsule Take 1 capsule (2.5 mg total) by mouth 2 (two) times daily before a meal. 06/06/20  Yes Derek Jack, MD  Gemcitabine HCl (GEMZAR IV) Inject into the vein See admin instructions. Days 1, 8, 15 q 28 days  05/17/19  Yes [provider]  insulin aspart (NOVOLOG) 100 UNIT/ML injection Inject 20 Units into the skin 3 (three) times daily before meals. 06/01/19  Yes Derek Jack, MD  Lactulose 20 GM/30ML SOLN Take 30 mLs (20 g total) by mouth at bedtime as needed. 06/06/20  Yes Derek Jack, MD  lidocaine-prilocaine (EMLA) cream Apply 1 application topically once as needed (for port access).  04/07/20  Yes [provider]  lisinopril (ZESTRIL) 40 MG tablet TAKE 1 TABLET(40 MG) BY MOUTH DAILY Patient taking differently: Take 40 mg by mouth daily.  06/11/20  Yes Derek Jack, MD  ondansetron (ZOFRAN) 4 MG tablet TAKE 1 TABLET(4 MG) BY MOUTH THREE TIMES DAILY 02/29/20  Yes Derek Jack, MD  oxyCODONE-acetaminophen (PERCOCET) 10-325 MG tablet Take 2  tablets by mouth 4 (four) times daily. 06/18/20  Yes [provider]  HYDROmorphone (DILAUDID) 4 MG tablet Take 1 tablet (4 mg total) by mouth every 4 (four) hours as needed for severe pain. 06/26/20   Orson Eva, MD  morphine (MS CONTIN) 30 MG 12 hr tablet Take 1 tablet (30 mg total) by mouth every 12 (twelve) hours. 06/06/20   Derek Jack, MD  morphine (MS CONTIN) 60 MG 12 hr tablet Take 1 tablet (60 mg  total) by mouth every 12 (twelve) hours. 06/26/20   Orson Eva, MD  prochlorperazine (COMPAZINE) 10 MG tablet Take 1 tablet (10 mg total) by mouth every 6 (six) hours as needed (Nausea or vomiting). Patient not taking: Reported on 10/25/2019 06/01/19 02/09/20  Derek Jack, MD     Family History  Problem Relation Age of Onset  . Diabetes Mother   . Hypertension Mother   . Lung cancer Mother 49       d. 44  . Diabetes Father   . Hypertension Father   . Heart disease Brother   . Stomach cancer Paternal Aunt 68  . Stroke Paternal Grandmother   . Prostate cancer Other        PGMs brother  . Stomach cancer Other        PGMs mother    Social History   Socioeconomic History  . Marital status: Divorced    Spouse name: Not on file  . Number of children: 1  . Years of education: Not on file  . Highest education level: Not on file  Occupational History  . Occupation: disabled  Tobacco Use  . Smoking status: Current Every Day Smoker    Packs/day: 1.00    Years: 45.00    Pack years: 45.00    Types: Cigarettes  . Smokeless tobacco: Never Used  Vaping Use  . Vaping Use: Never used  Substance and Sexual Activity  . Alcohol use: Not Currently  . Drug use: Never  . Sexual activity: Not on file  Other Topics Concern  . Not on file  Social History Narrative  . Not on file   Social Determinants of Health   Financial Resource Strain:   . Difficulty of Paying Living Expenses: Not on file  Food Insecurity:   . Worried About Charity fundraiser in the Last Year: Not on file  . Ran Out of Food in the Last Year: Not on file  Transportation Needs:   . Lack of Transportation (Medical): Not on file  . Lack of Transportation (Non-Medical): Not on file  Physical Activity:   . Days of Exercise per Week: Not on file  . Minutes of Exercise per Session: Not on file  Stress:   . Feeling of Stress : Not on file  Social Connections:   . Frequency of Communication with Friends and  Family: Not on file  . Frequency of Social Gatherings with Friends and Family: Not on file  . Attends Religious Services: Not on file  . Active Member of Clubs or Organizations: Not on file  . Attends Archivist Meetings: Not on file  . Marital Status: Not on file     Review of Systems: A 12 point ROS discussed and pertinent positives are indicated in the HPI above.  All other systems are negative.  Review of Systems  Constitutional: Positive for fatigue. Negative for chills and fever.  Respiratory: Negative for cough and shortness of breath.   Cardiovascular: Negative for chest pain.  Gastrointestinal: Positive for abdominal pain. Negative for nausea and vomiting.  Musculoskeletal: Negative for back pain.  Neurological: Negative for dizziness and headaches.    Vital Signs: BP (!) 134/110 (BP Location: Right Arm)   Pulse (!) 112   Temp 98.1 F (36.7 C)   Resp 18   Ht 5\' 10"  (1.778 m)   Wt 231 lb 7.7 oz (105 kg)   SpO2 96%   BMI 33.21 kg/m   Physical Exam Vitals reviewed.  Constitutional:      General: She is not in acute distress.    Appearance: She is ill-appearing.  HENT:     Head: Normocephalic.     Mouth/Throat:     Mouth: Mucous membranes are moist.     Pharynx: Oropharynx is clear. No oropharyngeal exudate or posterior oropharyngeal erythema.  Cardiovascular:     Rate and Rhythm: Regular rhythm. Tachycardia present.  Pulmonary:     Effort: Pulmonary effort is normal.     Breath sounds: Normal breath sounds.  Abdominal:     General: There is distension.     Palpations: Abdomen is soft.     Tenderness: There is no abdominal tenderness.  Skin:    General: Skin is warm and dry.     Coloration: Skin is not jaundiced.  Neurological:     Mental Status: She is alert and oriented to person, place, and time.  Psychiatric:        Mood and Affect: Mood normal.        Behavior: Behavior normal.        Thought Content: Thought content normal.         Judgment: Judgment normal.      MD Evaluation Airway: WNL Heart: WNL Abdomen: WNL Chest/ Lungs: WNL Mallampati/Airway Score: Two   Imaging: CT ABDOMEN PELVIS W CONTRAST  Result Date: 06/10/2020 CLINICAL DATA:  Abdominal pain, tenderness and swelling, receiving chemotherapy for pancreatic cancer EXAM: CT ABDOMEN AND PELVIS WITH CONTRAST TECHNIQUE: Multidetector CT imaging of the abdomen and pelvis was performed using the standard protocol following bolus administration of intravenous contrast. CONTRAST:  162mL OMNIPAQUE IOHEXOL 300 MG/ML  SOLN COMPARISON:  CT chest, abdomen and pelvis 05/28/2020. FINDINGS: Lower chest: Redemonstration of the scattered sub 5 mm pulmonary nodules in both lower lobes, right middle lobe and lingula compatible with suspected metastatic disease. Lung bases are otherwise clear. Normal heart size. No pericardial effusion. Hepatobiliary: Redemonstration of multiple hepatic metastases demonstrated by some rim enhancement and central hypoattenuation, largest in the inferior right lobe liver measuring up to 3 cm which is not significantly changed in size from the comparison exam when measured at a similar level. Largest lesion in the left lobe liver measures up to 1.7 cm, also unchanged from prior. Few subcentimeter hypoattenuating foci present in the superior right lobe (2/11, 12) are better visualized than on comparison. Normal hepatic attenuation. Smooth liver surface contour. No visible calcified gallstones. Mild gallbladder wall thickening, nonspecific given some adjacent ascites. No biliary ductal dilatation. Pancreas: Redemonstration of the heterogeneous, hypoattenuating 3.9 by 5.1 cm mass involving the pancreatic tail/body (2/32). Mass appears to encase the proximal celiac axis, splenic artery and vein as well as portion of the body of the left adrenal gland, overall similar in appearance to comparison study. Spleen: Normal in size. No concerning splenic lesions.  Adrenals/Urinary Tract: Involvement of the left adrenal gland by the pancreatic mass, as detailed above. No concerning right adrenal nodules. Asymmetric scarring of the interpolar right kidney, similar to comparison. Few  parapelvic cysts are noted. Nonobstructing calculus present in the lower pole right kidney. No concerning renal masses. No urolithiasis or hydronephrosis. Urinary bladder is largely decompressed at the time of exam and therefore poorly evaluated by CT imaging. No gross bladder abnormality is evident. Some mild perivesicular hazy stranding is nonspecific however. Stomach/Bowel: Distal esophagus is unremarkable. There is some mild thickening at the gastric antrum with mucosal hyperemia, nonspecific and possibly related to normal peristalsis though could correlate for features of gastritis. No small bowel thickening or dilatation. Small air-filled retrocecal appendix. No colonic dilatation or wall thickening. Scattered colonic diverticula without focal inflammation to suggest diverticulitis. No clear mechanical obstruction. Vascular/Lymphatic: Atherosclerotic calcifications within the abdominal aorta and branch vessels. No aneurysm or ectasia. No discernible enlarged abdominopelvic lymph nodes. Reproductive: Post hysterectomy. Nodular changes along the superior vaginal cuff are again difficult to visualize though may be present (2/78). No other concerning adnexal lesions. Other: Increasing peritoneal nodularity/omental caking with increasing moderate volume loculated abdominal ascites throughout the abdomen. No free intraperitoneal air. No bowel containing hernias. Mild body wall edema. Musculoskeletal: Multilevel degenerative changes are present in the imaged portions of the spine. No acute osseous abnormality or suspicious osseous lesion. IMPRESSION: 1. Increasing peritoneal nodularity/omental caking with increasing moderate volume possibly loculated malignant ascites throughout the abdomen. 2.  Redemonstration of the heterogeneous, hypoattenuating mass involving the pancreatic tail/body with encasement of the proximal celiac axis, splenic artery and vein as well as portion of the body of the left adrenal gland. 3. Multiple rim enhancing, centrally hypoattenuating liver lesions compatible with hepatic metastatic disease. 4. Redemonstration of the scattered sub 5 mm pulmonary nodules in the lung bases, compatible with suspected metastatic disease. 5. Mild thickening at the gastric antrum with mucosal hyperemia, nonspecific and possibly related to normal peristalsis though could correlate for features of gastritis. 6. Nonobstructing right nephrolithiasis and right renal cortical scarring. 7. Mild perivesicular hazy stranding is nonspecific however recommend correlation with urinalysis to exclude infection. 8. Aortic Atherosclerosis (ICD10-I70.0). Electronically Signed   By: Lovena Le M.D.   On: 06/10/2020 20:54   CT CHEST ABDOMEN PELVIS W CONTRAST  Addendum Date: 05/28/2020   ADDENDUM REPORT: 05/28/2020 13:36 ADDENDUM: Critical Value/emergent results were called by telephone at the time of interpretation on 05/28/2020 at 1:30 pm to provider [facility] , who verbally acknowledged these results. Electronically Signed   By: Julian Hy M.D.   On: 05/28/2020 13:36   Result Date: 05/28/2020 CLINICAL DATA:  Metastatic pancreatic cancer EXAM: CT CHEST, ABDOMEN, AND PELVIS WITH CONTRAST TECHNIQUE: Multidetector CT imaging of the chest, abdomen and pelvis was performed following the standard protocol during bolus administration of intravenous contrast. CONTRAST:  167mL OMNIPAQUE IOHEXOL 300 MG/ML  SOLN COMPARISON:  02/07/2020 FINDINGS: CT CHEST FINDINGS Cardiovascular: The heart is normal in size. No pericardial effusion. No evidence of thoracic aortic aneurysm. Atherosclerotic calcifications of the aortic arch. Although not tailored for evaluation of the pulmonary arteries, incidental  segmental/subsegmental right lower lobe pulmonary emboli are suspected (series 3/images 91 and 100). Overall clot burden is small. No findings to suggest right heart strain (RV to LV ratio 0.87). Right chest port terminates at the cavoatrial junction. Mediastinum/Nodes: No suspicious mediastinal lymphadenopathy. 9 mm short axis left perihilar node (series 3/image 77), grossly unchanged. Visualized thyroid is unremarkable. Lungs/Pleura: Numerous scattered small pulmonary nodules throughout all lobes, measuring up to 6 mm in the left lower lobe (series 10/images 74, 76, and 101), previously 4-5 mm. This appearance remains compatible with metastatic disease. Mild  centrilobular emphysematous changes, upper lung predominant. No focal consolidation. No pleural effusion or pneumothorax. Musculoskeletal: Degenerative changes of the thoracic spine. CT ABDOMEN PELVIS FINDINGS Hepatobiliary: Multiple hepatic metastases, progressive, now measuring up to 2.3 cm inferiorly in the right hepatic lobe (series 3/image 168). Previously, image 6 single live mm lesion. Gallbladder is unremarkable. No intrahepatic or extrahepatic ductal dilatation. Pancreas: 3.9 x 5.1 cm mass involving the pancreatic body/proximal tail (series 3/image 169), previously 3.7 x 5.5 cm, grossly unchanged. Mass encases the celiac trunk and directly invades the left adrenal gland. Spleen: Within normal limits. Adrenals/Urinary Tract: Left renal invasion, as above. Right adrenal gland is within normal limits. Left kidney is within normal limits. Stable right renal scarring with 7 mm nonobstructing right lower pole renal calculus (series 3/image 213) and fullness of the right renal collecting system. Bladder is underdistended and poorly evaluated. Stomach/Bowel: Stomach is within normal limits. No evidence of bowel obstruction. No colonic wall thickening or mass is evident on CT. Vascular/Lymphatic: No evidence of abdominal aortic aneurysm. Atherosclerotic  calcifications of the abdominal aorta and branch vessels. No suspicious abdominopelvic lymphadenopathy. Reproductive: Status post hysterectomy. Pelvic nodularity at the vaginal apex is poorly visualized on the current CT. No adnexal masses. Other: Small to moderate abdominopelvic ascites, new. Associated mild peritoneal disease/omental caking. Musculoskeletal: Degenerative changes of the lumbar spine. IMPRESSION: Although not tailored for evaluation of the pulmonary arteries, there are incidental segmental/subsegmental right lower lobe pulmonary emboli. Overall clot burden is small. No findings to suggest right heart strain. 5.1 cm pancreatic mass, corresponding to the patient's known pancreatic adenocarcinoma, grossly unchanged. Associated vascular encasement. Direct invasion of the left adrenal gland. Progressive hepatic metastases. Small to moderate abdominopelvic ascites with mild peritoneal disease/omental caking, new. Multifocal pulmonary metastases measuring up to 6 mm, mildly progressive. 9 mm short axis left perihilar node, grossly unchanged. Electronically Signed: By: Julian Hy M.D. On: 05/28/2020 13:26   US Paracentesis  Result Date: 06/25/2020 INDICATION: Pancreatic cancer, malignant ascites EXAM: ULTRASOUND GUIDED THERAPEUTIC PARACENTESIS MEDICATIONS: NONE COMPLICATIONS: NONE IMMEDIATE PROCEDURE: Informed written consent was obtained from the patient after a discussion of the risks, benefits and alternatives to treatment. A timeout was performed prior to the initiation of the procedure. Initial ultrasound scanning demonstrates a large amount of ascites within the LEFT lower abdominal quadrant. The right lower abdomen was prepped and draped in the usual sterile fashion. 1% lidocaine was used for local anesthesia. Following this, a 5 [place] Yueh catheter was introduced. An ultrasound image was saved for documentation purposes. The paracentesis was performed. The catheter was removed and a  dressing was applied. The patient tolerated the procedure well without immediate post procedural complication. Patient received post-procedure intravenous albumin; see nursing notes for details. FINDINGS: A total of approximately 3.0 L of cloudy amber colored ascitic fluid was removed. Samples were sent to the laboratory as requested by the clinical team. IMPRESSION: Successful ultrasound-guided paracentesis yielding 3.0 liters of peritoneal fluid. Electronically Signed   By: Lavonia Dana M.D.   On: 06/25/2020 16:20   US Paracentesis  Result Date: 06/23/2020 INDICATION: Patient with a history of a metastatic pancreatic cancer and recurrent ascites. Interventional radiology asked to perform a therapeutic and diagnostic paracentesis. EXAM: ULTRASOUND GUIDED scratch PARACENTESIS MEDICATIONS: 1% lidocaine 10 mL COMPLICATIONS: None immediate. PROCEDURE: Informed written consent was obtained from the patient after a discussion of the risks, benefits and alternatives to treatment. A timeout was performed prior to the initiation of the procedure. Initial ultrasound scanning demonstrates a large amount  of ascites within the left lower abdominal quadrant. The left lower abdomen was prepped and draped in the usual sterile fashion. 1% lidocaine was used for local anesthesia. Following this, a 19 gauge, 10-cm, Yueh catheter was introduced. An ultrasound image was saved for documentation purposes. The paracentesis was performed. The catheter was removed and a dressing was applied. The patient tolerated the procedure well without immediate post procedural complication. FINDINGS: A total of approximately 3.8 L of clear yellow fluid was removed. Samples were sent to the laboratory as requested by the clinical team. IMPRESSION: Successful ultrasound-guided paracentesis yielding 3.8 liters of peritoneal fluid. Read by: Soyla Dryer, NP Electronically Signed   By: Jerilynn Mages.  Shick M.D.   On: 06/23/2020 12:06   US  Paracentesis  Result Date: 06/14/2020 INDICATION: Malignant ascites EXAM: ULTRASOUND GUIDED LLQ PARACENTESIS MEDICATIONS: 10 cc 1% lidocaine COMPLICATIONS: None immediate. PROCEDURE: Informed written consent was obtained from the patient after a discussion of the risks, benefits and alternatives to treatment. A timeout was performed prior to the initiation of the procedure. Initial ultrasound scanning demonstrates a large amount of ascites within the left lower abdominal quadrant. The left lower abdomen was prepped and draped in the usual sterile fashion. 1% lidocaine was used for local anesthesia. Following this, a 56 G Yueh catheter was introduced. An ultrasound image was saved for documentation purposes. The paracentesis was performed. The catheter was removed and a dressing was applied. The patient tolerated the procedure well without immediate post procedural complication. Patient received post-procedure intravenous albumin; see nursing notes for details. FINDINGS: A total of approximately 3 liters of dark yellow fluid was removed. IMPRESSION: Successful ultrasound-guided paracentesis yielding 3 liters of peritoneal fluid. Read by Lavonia Drafts [facility] Electronically Signed   By: Lavonia Dana M.D.   On: 06/14/2020 11:09   US Paracentesis  Result Date: 06/11/2020 INDICATION: Ascites EXAM: ULTRASOUND GUIDED left PARACENTESIS MEDICATIONS: None. COMPLICATIONS: None immediate. PROCEDURE: Informed written consent was obtained from the patient after a discussion of the risks (including hemorrhage, infection, and damage to adjacent structures, among others), benefits and alternatives to treatment. A timeout was performed prior to the initiation of the procedure. Initial ultrasound scanning demonstrates a small amount of ascites within the left lower abdominal quadrant. The left lower abdomen was prepped and draped in the usual sterile fashion. 1% lidocaine was used for local anesthesia. Following this, a Yueh  catheter was introduced. An ultrasound image was saved for documentation purposes. The paracentesis was performed but only 20 cc of serosanguineous fluid could be aspirated. The catheter was removed and a dressing was applied. The patient tolerated the procedure well without immediate post procedural complication. FINDINGS: A total of approximately 20 cc of serosanguineous fluid was removed. Samples were sent to the laboratory as requested by the clinical team. IMPRESSION: Ultrasound-guided paracentesis yielding 20 cc of peritoneal fluid. Electronically Signed   By: Van Clines M.D.   On: 06/11/2020 12:08   DG Chest Port 1 View  Result Date: 06/22/2020 CLINICAL DATA:  Shortness of breath EXAM: PORTABLE CHEST 1 VIEW COMPARISON:  06/10/2020 chest and abdominal radiographs. FINDINGS: Right chest wall Port-A-Cath tip overlies the superior cavoatrial junction. No focal consolidation, pneumothorax or pleural effusion. Cardiomediastinal silhouette is within normal limits. No acute osseous abnormality. IMPRESSION: No acute airspace disease. Electronically Signed   By: Primitivo Gauze M.D.   On: 06/22/2020 14:57   DG Abdomen Acute W/Chest  Result Date: 06/10/2020 CLINICAL DATA:  59 year old with upper abdominal pain, tenderness and swelling. Current  history of pancreatic cancer for which the patient is receiving chemotherapy. Current smoker. EXAM: ACUTE ABDOMEN SERIES (2 VIEW ABDOMEN AND 1 VIEW CHEST) COMPARISON:  CT chest, abdomen and pelvis 05/28/2020 and earlier. FINDINGS: Mild gaseous distension of the rectum in the transverse colon. Moderate colonic stool burden. No evidence of small-bowel distention. No evidence of free air or significant air-fluid levels on the ERECT image. Phleboliths in both sides of the pelvis. No visible opaque urinary tract calculi. Degenerative changes involving the lower lumbar spine. Cardiomediastinal silhouette unremarkable. RIGHT jugular Port-A-Cath tip projects at or near  the cavoatrial junction. Mild emphysematous changes throughout both lungs as noted on the prior CT. The lung nodules identified on CT are inconspicuous on the x-ray. No confluent or ground-glass airspace consolidation. No pleural effusions. IMPRESSION: 1. No evidence of bowel obstruction or free intraperitoneal air. 2. Moderate colonic stool burden. 3. Mild COPD/emphysema. No acute cardiopulmonary disease.  S Electronically Signed   By: Evangeline Dakin M.D.   On: 06/10/2020 16:41    Labs:  CBC: Recent Labs    06/14/20 0643 06/22/20 1455 06/23/20 0706 06/24/20 0757  WBC 5.0 6.2 6.1 6.1  HGB 12.4 12.8 12.6 12.1  HCT 39.4 39.5 39.2 38.7  PLT 310 376 382 331    COAGS: Recent Labs    06/11/20 0104  INR 1.2  APTT 34    BMP: Recent Labs    06/22/20 1455 06/23/20 0706 06/24/20 0757 06/25/20 0643  NA 128* 129* 132* 134*  K 4.4 4.5 4.7 4.3  CL 91* 91* 97* 97*  CO2 26 25 27 26   GLUCOSE 174* 148* 132* 123*  BUN 22* 22* 17 15  CALCIUM 8.6* 8.9 8.6* 8.3*  CREATININE 0.97 1.04* 0.82 0.69  GFRNONAA >60 59* >60 >60  GFRAA >60 >60 >60 >60    LIVER FUNCTION TESTS: Recent Labs    06/10/20 1455 06/11/20 0104 06/12/20 0613 06/22/20 1455  BILITOT 0.5 0.8 0.7 0.7  AST 17 14* 14* 18  ALT 13 12 11 16   ALKPHOS 70 60 57 91  PROT 6.7 6.0* 5.9* 6.1*  ALBUMIN 3.3* 3.0* 3.0* 2.9*    TUMOR MARKERS: No results for input(s): AFPTM, CEA, CA199, CHROMGRNA in the last 8760 hours.  Assessment and Plan:  59 y/o F with history of pancreatic cancer and recurrent malignant ascites who presents today for abdominal pleurx placement so she may go home with hospice care.  Patient has been NPO since midnight, last dose of Eliquis 9/27.   Risks and benefits discussed with the patient including bleeding, infection, damage to adjacent structures, malfunction of the catheter with need for additional procedures.  All of the patient's questions were answered, patient is agreeable to  proceed.  Consent signed and in chart.  Thank you for this interesting consult.  I greatly enjoyed meeting Doris [name] and look forward to participating in their care.  A copy of this report was sent to the requesting provider on this date.  Electronically Signed: Joaquim Nam, PA-C 06/27/2020, 8:04 AM   I spent a total of 40 Minutes  in face to face in clinical consultation, greater than 50% of which was counseling/coordinating care for abdominal pleurx placement.

## 2020-06-27 NOTE — Progress Notes (Signed)
Pt arrived via carelink. VSS, pt is alert and oriented, pt complains of abdominal pain 6/10, received dilaudid in route per carelink RN. States that her pain is better since receiving pain medication, will continue to monitor.

## 2020-06-27 NOTE — Progress Notes (Signed)
Pt transported back to room by carelink from IR. Will get pt ready for discharge.

## 2020-06-27 NOTE — TOC Transition Note (Signed)
Transition of Care [facility]) - CM/SW Discharge Note   Patient Details  Name: Doris [name] MRN: [id number] Date of Birth: 1961/02/13  Transition of Care [facility]) CM/SW Contact:  Shade Flood, LCSW Phone Number: 06/27/2020, 11:35 AM   Clinical Narrative:     Pt stable for dc today after PleurX drain placement. Pt sent home with a case of starter supplies. [employer] order form completed with MD and faxed to the company. This LCSW had spoken with pt yesterday regarding hospice at home services and pt requested referral to any provider. Discussed [facility] and pt was agreeable.  Referred to Tiffany at [facility] this AM. Faxed requested clinical information and they will follow up with pt by phone to schedule intake visit.  No other TOC needs for dc.    Expected Discharge Plan: Home w Hospice Care Barriers to Discharge: Barriers Resolved   Patient Goals and CMS Choice   CMS Medicare.gov Compare Post Acute Care list provided to:: Patient Choice offered to / list presented to : Patient  Expected Discharge Plan and Services Expected Discharge Plan: Home w Hospice Care In-house Referral: Clinical Social Work   Post Acute Care Choice: Hospice Living arrangements for the past 2 months: [place] Expected Discharge Date: 06/26/20                                    Prior Living Arrangements/Services Living arrangements for the past 2 months: Single Family Home Lives with:: Self Patient language and need for interpreter reviewed:: Yes Do you feel safe going back to the place where you live?: Yes      Need for Family Participation in Patient Care: Yes (Comment) Care giver support system in place?: Yes (comment)   Criminal Activity/Legal Involvement Pertinent to Current Situation/Hospitalization: No - Comment as needed  Activities of Daily Living Home Assistive Devices/Equipment: Cane (specify quad or straight) ADL Screening (condition at time of  admission) Patient's cognitive ability adequate to safely complete daily activities?: Yes Is the patient deaf or have difficulty hearing?: No Does the patient have difficulty seeing, even when wearing glasses/contacts?: No Does the patient have difficulty concentrating, remembering, or making decisions?: No Patient able to express need for assistance with ADLs?: Yes Does the patient have difficulty dressing or bathing?: No Independently performs ADLs?: Yes (appropriate for developmental age) Does the patient have difficulty walking or climbing stairs?: No Weakness of Legs: None Weakness of Arms/Hands: None  Permission Sought/Granted Permission sought to share information with : Chartered certified accountant granted to share information with : Yes, Verbal Permission Granted     Permission granted to share info w AGENCY: [facility]        Emotional Assessment   Attitude/Demeanor/Rapport: Engaged Affect (typically observed): Flat Orientation: : Oriented to Self, Oriented to Place, Oriented to  Time, Oriented to Situation Alcohol / Substance Use: Not Applicable Psych Involvement: No (comment)  Admission diagnosis:  Dehydration [E86.0] Malignant ascites [R18.0] Malignant neoplasm of pancreas, unspecified location of malignancy [facility]) [C25.9] Patient Active Problem List   Diagnosis Date Noted  . Dehydration   . Palliative care by specialist   . Uncontrolled pain 06/23/2020  . Hyponatremia 06/22/2020  . Malignant ascites   . Hypoalbuminemia 06/11/2020  . Hyperglycemia due to diabetes mellitus ([place]) 06/11/2020  . Abdominal pain 06/10/2020  . History of pulmonary embolus (PE) 05/28/2020  . Genetic testing 09/02/2019  . Family history of stomach cancer   .  Family history of prostate cancer   . Port-A-Cath in place 05/16/2019  . Malignant neoplasm of pancreas ([place]) 05/09/2019  . Goals of care, counseling/discussion 05/09/2019   PCP:  Merdis Delay,  DO Pharmacy:   [facility] [place] [address] Phone: [phone] Fax: [phone]     Social Determinants of Health (SDOH) Interventions    Readmission Risk Interventions Readmission Risk Prevention Plan 06/27/2020  Transportation Screening Complete  Medication Review (RN CM) Complete    Final next level of care: Home w Hospice Care Barriers to Discharge: Barriers Resolved   Patient Goals and CMS Choice   CMS Medicare.gov Compare Post Acute Care list provided to:: Patient Choice offered to / list presented to : Patient  Discharge Placement                       Discharge Plan and Services In-house Referral: Clinical Social Work   Post Acute Care Choice: Hospice                               Social Determinants of Health ([place]) Interventions     Readmission Risk Interventions Readmission Risk Prevention Plan 06/27/2020  Transportation Screening Complete  Medication Review (RN CM) Complete

## 2020-06-27 NOTE — Plan of Care (Signed)
  Problem: Education: Goal: Knowledge of General Education information will improve Description: Including pain rating scale, medication(s)/side effects and non-pharmacologic comfort measures 06/27/2020 1034 by Melony Overly, RN Outcome: Adequate for Discharge 06/27/2020 1034 by Melony Overly, RN Outcome: Adequate for Discharge   Problem: Health Behavior/Discharge Planning: Goal: Ability to manage health-related needs will improve 06/27/2020 1034 by Melony Overly, RN Outcome: Adequate for Discharge 06/27/2020 1034 by Melony Overly, RN Outcome: Adequate for Discharge   Problem: Clinical Measurements: Goal: Ability to maintain clinical measurements within normal limits will improve 06/27/2020 1034 by Melony Overly, RN Outcome: Adequate for Discharge 06/27/2020 1034 by Melony Overly, RN Outcome: Adequate for Discharge Goal: Will remain free from infection 06/27/2020 1034 by Melony Overly, RN Outcome: Adequate for Discharge 06/27/2020 1034 by Melony Overly, RN Outcome: Adequate for Discharge Goal: Diagnostic test results will improve 06/27/2020 1034 by Melony Overly, RN Outcome: Adequate for Discharge 06/27/2020 1034 by Melony Overly, RN Outcome: Adequate for Discharge Goal: Respiratory complications will improve 06/27/2020 1034 by Melony Overly, RN Outcome: Adequate for Discharge 06/27/2020 1034 by Melony Overly, RN Outcome: Adequate for Discharge Goal: Cardiovascular complication will be avoided 06/27/2020 1034 by Melony Overly, RN Outcome: Adequate for Discharge 06/27/2020 1034 by Melony Overly, RN Outcome: Adequate for Discharge   Problem: Activity: Goal: Risk for activity intolerance will decrease 06/27/2020 1034 by Melony Overly, RN Outcome: Adequate for Discharge 06/27/2020 1034 by Melony Overly, RN Outcome: Adequate for Discharge   Problem: Nutrition: Goal: Adequate nutrition will be maintained 06/27/2020 1034 by Melony Overly, RN Outcome:  Adequate for Discharge 06/27/2020 1034 by Melony Overly, RN Outcome: Adequate for Discharge   Problem: Coping: Goal: Level of anxiety will decrease 06/27/2020 1034 by Melony Overly, RN Outcome: Adequate for Discharge 06/27/2020 1034 by Melony Overly, RN Outcome: Adequate for Discharge   Problem: Elimination: Goal: Will not experience complications related to bowel motility 06/27/2020 1034 by Melony Overly, RN Outcome: Adequate for Discharge 06/27/2020 1034 by Melony Overly, RN Outcome: Adequate for Discharge Goal: Will not experience complications related to urinary retention 06/27/2020 1034 by Melony Overly, RN Outcome: Adequate for Discharge 06/27/2020 1034 by Melony Overly, RN Outcome: Adequate for Discharge   Problem: Pain Managment: Goal: General experience of comfort will improve 06/27/2020 1034 by Melony Overly, RN Outcome: Adequate for Discharge 06/27/2020 1034 by Melony Overly, RN Outcome: Adequate for Discharge   Problem: Safety: Goal: Ability to remain free from injury will improve 06/27/2020 1034 by Melony Overly, RN Outcome: Adequate for Discharge 06/27/2020 1034 by Melony Overly, RN Outcome: Adequate for Discharge   Problem: Skin Integrity: Goal: Risk for impaired skin integrity will decrease 06/27/2020 1034 by Melony Overly, RN Outcome: Adequate for Discharge 06/27/2020 1034 by Melony Overly, RN Outcome: Adequate for Discharge

## 2020-06-27 NOTE — Procedures (Signed)
Interventional Radiology Procedure Note  Procedure: Ultrasound and fluoroscopic guided placement of tunneled peritoneal catheter  Findings: Please refer to procedural dictation for full description.  Placement of peritoneal Pleurex in LLQ.  Complications: None immediate  Estimated Blood Loss: <10 mL  Recommendations: Ok to begin intermittent peritoneal drainage as needed.  Ruthann Cancer, MD Pager: [phone]

## 2020-06-27 NOTE — Progress Notes (Signed)
PRN medication pulled for administration during transport to [facility] for PleurX cath. See MAR note

## 2020-06-28 MED ORDER — FENTANYL CITRATE (PF) 100 MCG/2ML IJ SOLN
INTRAMUSCULAR | Status: AC | PRN
  Administered 2020-06-27 (×2): 25 ug via INTRAVENOUS

## 2020-06-28 MED ORDER — MIDAZOLAM HCL 2 MG/2ML IJ SOLN
INTRAMUSCULAR | Status: AC | PRN
  Administered 2020-06-27 (×2): 0.5 mg via INTRAVENOUS

## 2020-07-11 ENCOUNTER — Other Ambulatory Visit (HOSPITAL_COMMUNITY): Payer: Self-pay | Admitting: Hematology

## 2020-07-12 ENCOUNTER — Telehealth: Payer: Self-pay | Admitting: Genetic Counselor

## 2020-07-12 NOTE — Telephone Encounter (Signed)
Mailbox is full and I could not leave a message. 

## 2020-07-13 ENCOUNTER — Encounter: Payer: Self-pay | Admitting: Genetic Counselor

## 2020-07-30 DEATH — deceased

## 2020-10-29 IMAGING — CT CT CHEST W/ CM
2 of 8 series · 13 of 46 positions shown, 15 images · IV contrast (Omnipaque or Isovue)
Comparison: 09/19/2019

CLINICAL DATA: History of pancreatic neoplasm with metastatic
disease to the liver.

EXAM:
CT CHEST, ABDOMEN, AND PELVIS WITH CONTRAST
TECHNIQUE: Multidetector CT imaging of the chest, abdomen and pelvis was
performed following the standard protocol during bolus
administration of intravenous contrast.
CONTRAST:  100mL OMNIPAQUE IOHEXOL 300 MG/ML  SOLN

[Series 3: axial venous · axial · portal-venous · 0.68mm/px · z∈[+881,+1445]mm · 10 of 226 slices shown, 12 images]
[im 19/226  soft-tissue]
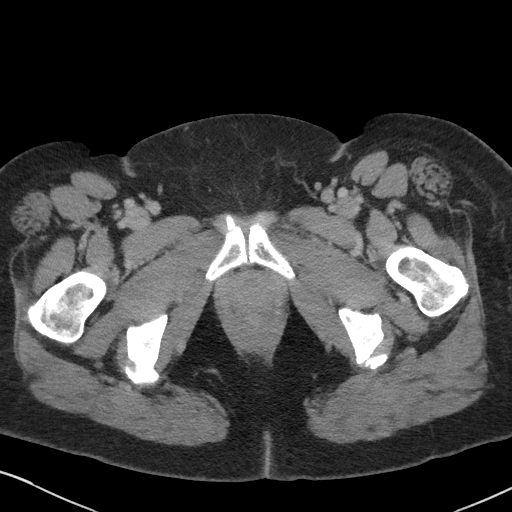
[im 19/226  bone]
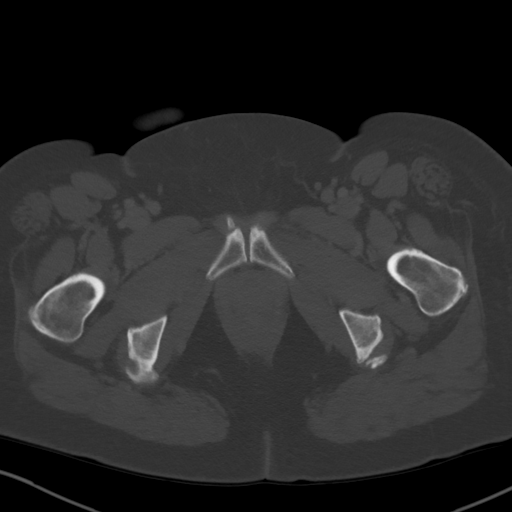
[im 38/226  soft-tissue]
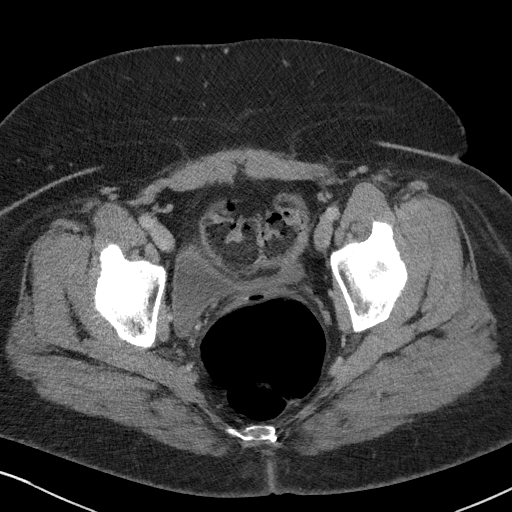
[im 57/226  soft-tissue]
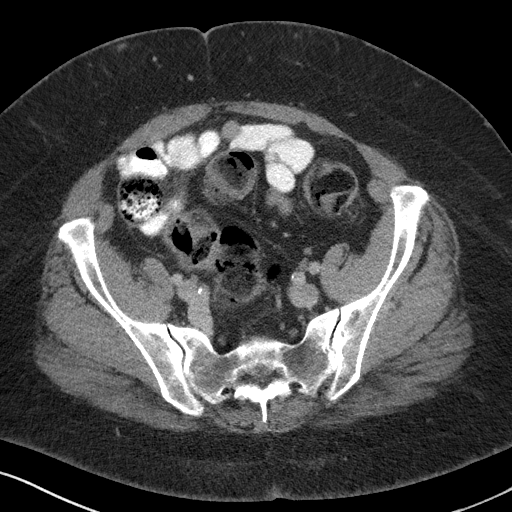
[im 76/226  soft-tissue]
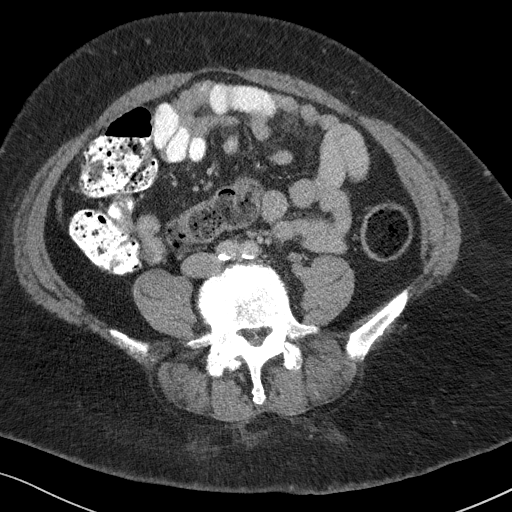
[im 94/226  soft-tissue]
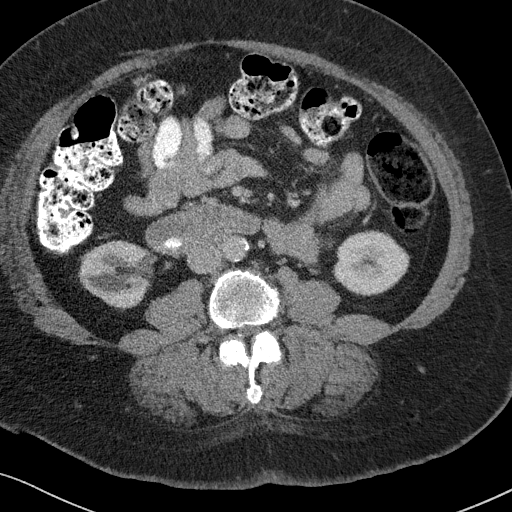
[im 132/226  soft-tissue]
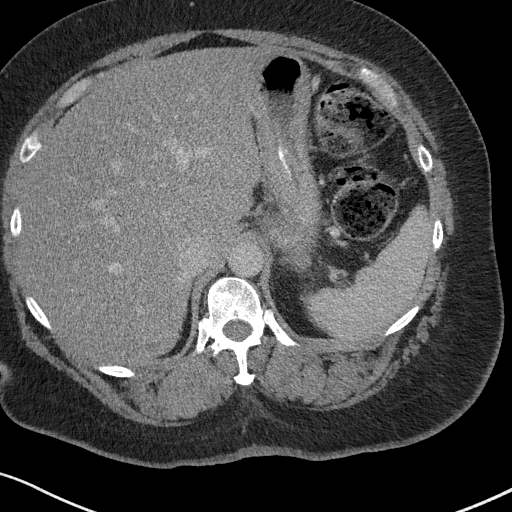
[im 151/226  soft-tissue]
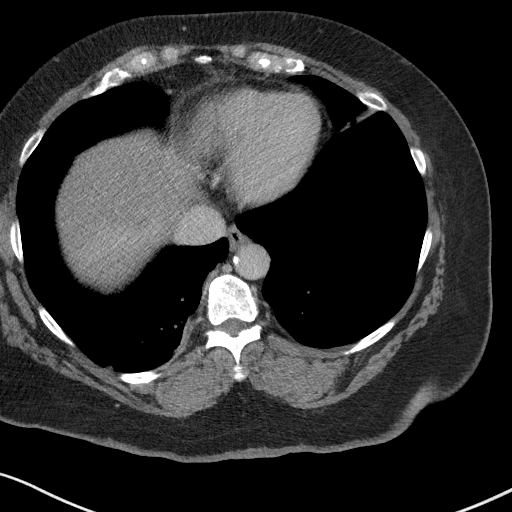
[im 169/226  soft-tissue]
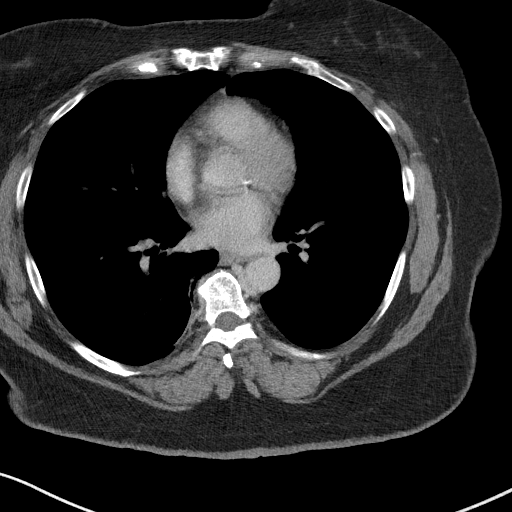
[im 188/226  soft-tissue]
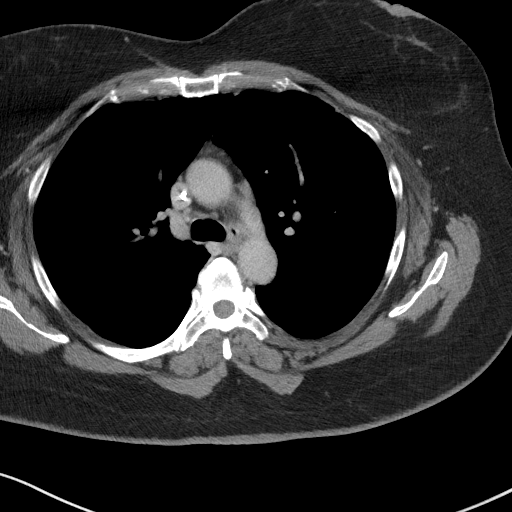
[im 188/226  bone]
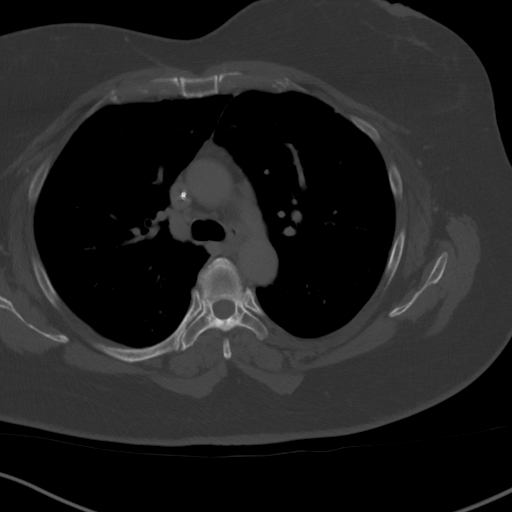
[im 207/226  soft-tissue]
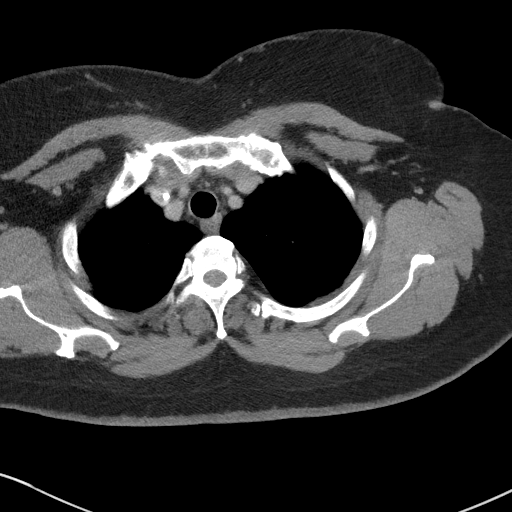

[Series 7: coronal arterial · coronal · arterial · 0.49mm/px · 3 of 101 slices shown]
[im 26/101  soft-tissue]
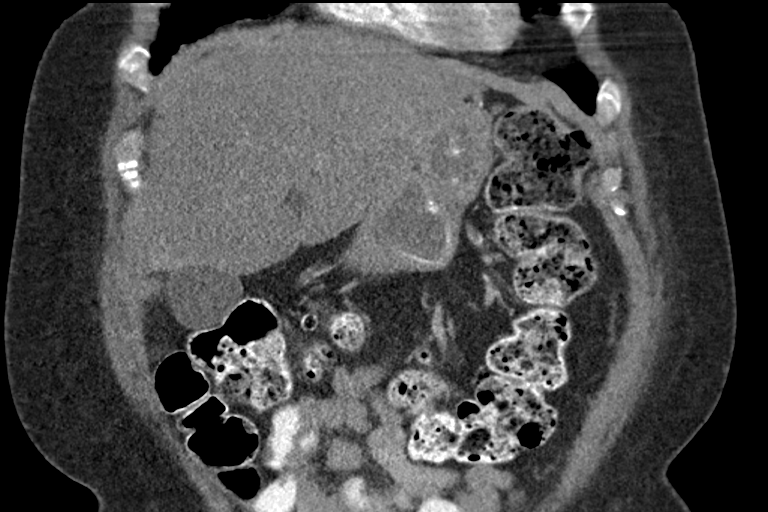
[im 51/101  soft-tissue]
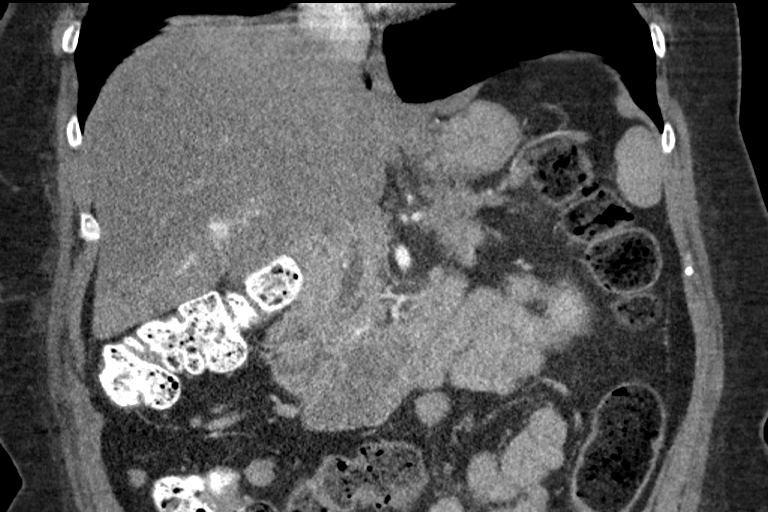
[im 76/101  soft-tissue]
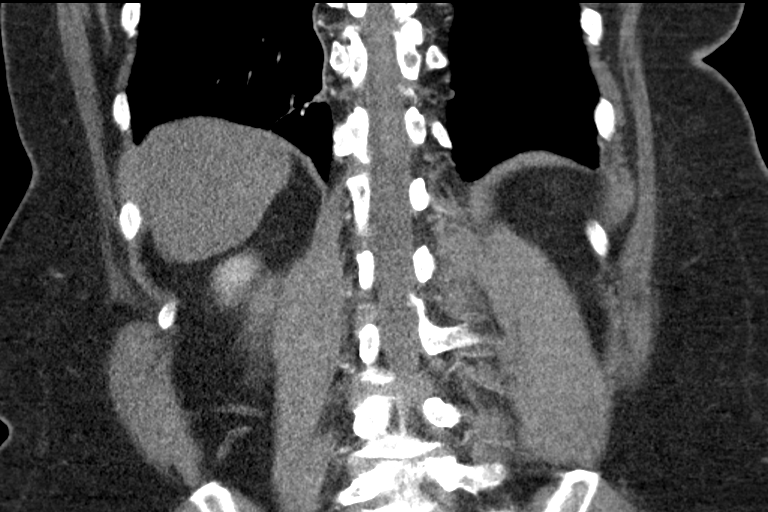

[13 of 46 positions shown; findings below may reference images not displayed]

FINDINGS: CT CHEST FINDINGS

Cardiovascular: Heart size is stable and normal without pericardial
effusion. RIGHT-sided central venous access device, Port-A-Cath
terminates at the caval to atrial junction. Scattered aortic
calcifications. No sign of aneurysm. Central pulmonary arteries are
unremarkable.

Mediastinum/Nodes: No thoracic inlet adenopathy. No axillary
lymphadenopathy. No mediastinal lymphadenopathy. No hilar
lymphadenopathy.

Lungs/Pleura: Signs of pulmonary metastatic disease are considered
based on enlargement of existing pulmonary nodules with new
pulmonary nodules having developed since the previous imaging
evaluation.

(Image 75, series 10) 5 mm LEFT lower lobe pulmonary nodule
previously 3 mm.

RIGHT lower lobe pulmonary nodule abutting the pleura (image 80,
series 10) 5 mm previously 3 mm. Other small nodules more numerous
than on the prior exam but remaining less than a cm are scattered
throughout the RIGHT lung base, on image 109 of series 10 is a new 6
mm pulmonary nodule. On image 90 of series 10 is a new 4 mm RIGHT
lower lobe pulmonary nodule a background of pulmonary emphysema is
demonstrated, similar to prior examination. Signs of lingular
scarring. Airways are patent.

Musculoskeletal: No sign of chest wall mass.

CT ABDOMEN PELVIS FINDINGS

Hepatobiliary: Hepatic steatosis. Lobular hepatic contours.
Low-density lesion in the posterior RIGHT hemi liver (image 99,
series 3) 11 mm, previously 9 mm.

No pericholecystic stranding. Stable dilation of the common bile
duct up to 6 mm.

Pancreas: Soft tissue in the body and tail of the pancreas encasing
vascular structures in the upper abdomen including the celiac trunk
measuring 5.5 x 3.7 cm as compared to 3.2 x 1.9 cm. Soft tissue
stranding extending into the hepatic gastric ligament and towards
the ligament of Treitz in the upper abdomen also with involvement of
the LEFT adrenal by direct extension.

Occlusion of splenic vein with numerous collaterals in the upper
abdomen.

Spleen: Normal size without focal lesion.

Adrenals/Urinary Tract: LEFT adrenal with direct involvement from
pancreatic mass.

Renal cortical scarring and dilated collecting systems on the RIGHT
with similar appearance. As compared to previous exams. Normal
enhancement of the LEFT kidney. Nephrolithiasis on the RIGHT similar
to prior studies as well.

Stomach/Bowel: Soft tissue extending towards small-bowel at the
ligament of Treitz as described. No sign of small bowel obstruction
or acute small bowel process. Stool fills much of the colon.
Abundant stool and gas in the rectum. Appendix is normal.

Vascular/Lymphatic: Calcified and noncalcified plaque in the
abdominal aorta. No aneurysm. No adenopathy in the retroperitoneum.
No adenopathy in the upper abdomen. Ill-defined soft tissue as
stated above extending into hepatic gastric ligament and ligament of
Treitz.

Reproductive: No sign of pelvic lymphadenopathy. Post hysterectomy.
Nodular soft tissue at the vaginal apex (image 183, series 3) this
is in the upper portion of the cul-de-sac just above the urinary
bladder also seen on the coronal images (image 70, series 11) this
area measures approximately 2.5 x 1.8 cm. This is associated with
nodularity in the sigmoid mesocolon (image 181, series 3) also with
nodularity along the RIGHT hepatic margin and just below the
gallbladder fossa (image 121, series 3) this area measuring 6 mm
greatest thickness by 2.2 cm.

Other: As above with suspected peritoneal disease

Musculoskeletal: No acute bone finding. No destructive bone process.
IMPRESSION: 1. New and enlarging pulmonary nodules suspicious for pulmonary
metastatic disease.
2. Enlarging primary pancreatic lesion with infiltration of soft
tissue planes about the pancreas and further vascular encasement.
3. Signs of peritoneal disease.
4. Enlarging hepatic lesion on a background of marked hepatic
steatosis.
